# Patient Record
Sex: Male | Born: 1960 | Race: Black or African American | Hispanic: No | Smoking: Former smoker
Health system: Southern US, Community
[De-identification: ages and names within clinical notes are randomized; demographics above are authoritative.]

## PROBLEM LIST (undated history)

## (undated) DIAGNOSIS — R519 Headache, unspecified: Secondary | ICD-10-CM

## (undated) DIAGNOSIS — K635 Polyp of colon: Secondary | ICD-10-CM

## (undated) DIAGNOSIS — R51 Headache: Secondary | ICD-10-CM

## (undated) DIAGNOSIS — R972 Elevated prostate specific antigen [PSA]: Secondary | ICD-10-CM

## (undated) DIAGNOSIS — K5289 Other specified noninfective gastroenteritis and colitis: Secondary | ICD-10-CM

## (undated) DIAGNOSIS — K5790 Diverticulosis of intestine, part unspecified, without perforation or abscess without bleeding: Secondary | ICD-10-CM

## (undated) DIAGNOSIS — K449 Diaphragmatic hernia without obstruction or gangrene: Secondary | ICD-10-CM

## (undated) DIAGNOSIS — K219 Gastro-esophageal reflux disease without esophagitis: Secondary | ICD-10-CM

## (undated) HISTORY — PX: TONSILLECTOMY: SUR1361

## (undated) HISTORY — PX: SKIN BIOPSY: SHX1

## (undated) HISTORY — PX: COLON SURGERY: SHX602

## (undated) HISTORY — DX: Diverticulosis of intestine, part unspecified, without perforation or abscess without bleeding: K57.90

## (undated) HISTORY — DX: Diaphragmatic hernia without obstruction or gangrene: K44.9

## (undated) HISTORY — PX: OTHER SURGICAL HISTORY: SHX169

## (undated) HISTORY — DX: Gastro-esophageal reflux disease without esophagitis: K21.9

## (undated) HISTORY — PX: PROSTATE BIOPSY: SHX241

---

## 2002-03-12 ENCOUNTER — Encounter (INDEPENDENT_AMBULATORY_CARE_PROVIDER_SITE_OTHER): Payer: Self-pay | Admitting: *Deleted

## 2002-03-12 ENCOUNTER — Ambulatory Visit (HOSPITAL_COMMUNITY): Admission: RE | Admit: 2002-03-12 | Discharge: 2002-03-12 | Payer: Self-pay | Admitting: Gastroenterology

## 2002-03-14 ENCOUNTER — Encounter: Payer: Self-pay | Admitting: Gastroenterology

## 2002-03-14 ENCOUNTER — Ambulatory Visit (HOSPITAL_COMMUNITY): Admission: RE | Admit: 2002-03-14 | Discharge: 2002-03-14 | Payer: Self-pay | Admitting: Gastroenterology

## 2004-06-08 ENCOUNTER — Emergency Department (HOSPITAL_COMMUNITY): Admission: EM | Admit: 2004-06-08 | Discharge: 2004-06-09 | Payer: Self-pay | Admitting: Emergency Medicine

## 2005-08-19 ENCOUNTER — Ambulatory Visit: Payer: Self-pay | Admitting: Gastroenterology

## 2005-12-10 ENCOUNTER — Inpatient Hospital Stay (HOSPITAL_COMMUNITY): Admission: AC | Admit: 2005-12-10 | Discharge: 2005-12-28 | Payer: Self-pay

## 2005-12-28 ENCOUNTER — Inpatient Hospital Stay (HOSPITAL_COMMUNITY)
Admission: RE | Admit: 2005-12-28 | Discharge: 2006-01-01 | Payer: Self-pay | Admitting: Physical Medicine & Rehabilitation

## 2005-12-28 ENCOUNTER — Ambulatory Visit: Payer: Self-pay | Admitting: Physical Medicine & Rehabilitation

## 2006-01-02 ENCOUNTER — Emergency Department (HOSPITAL_COMMUNITY): Admission: EM | Admit: 2006-01-02 | Discharge: 2006-01-02 | Payer: Self-pay | Admitting: Emergency Medicine

## 2006-01-05 ENCOUNTER — Encounter
Admission: RE | Admit: 2006-01-05 | Discharge: 2006-01-12 | Payer: Self-pay | Admitting: Physical Medicine & Rehabilitation

## 2006-01-24 ENCOUNTER — Encounter: Admission: RE | Admit: 2006-01-24 | Discharge: 2006-01-24 | Payer: Self-pay | Admitting: Anesthesiology

## 2006-07-04 ENCOUNTER — Emergency Department (HOSPITAL_COMMUNITY): Admission: EM | Admit: 2006-07-04 | Discharge: 2006-07-04 | Payer: Self-pay | Admitting: Emergency Medicine

## 2006-08-03 ENCOUNTER — Ambulatory Visit: Payer: Self-pay | Admitting: Gastroenterology

## 2006-08-12 ENCOUNTER — Ambulatory Visit: Payer: Self-pay | Admitting: Gastroenterology

## 2006-08-12 ENCOUNTER — Encounter: Payer: Self-pay | Admitting: Gastroenterology

## 2006-09-07 ENCOUNTER — Ambulatory Visit: Payer: Self-pay | Admitting: Gastroenterology

## 2006-12-29 ENCOUNTER — Encounter: Admission: RE | Admit: 2006-12-29 | Discharge: 2006-12-29 | Payer: Self-pay | Admitting: Specialist

## 2007-02-03 ENCOUNTER — Encounter: Admission: RE | Admit: 2007-02-03 | Discharge: 2007-04-05 | Payer: Self-pay | Admitting: Specialist

## 2007-03-21 ENCOUNTER — Emergency Department (HOSPITAL_COMMUNITY): Admission: EM | Admit: 2007-03-21 | Discharge: 2007-03-21 | Payer: Self-pay | Admitting: Emergency Medicine

## 2007-04-10 ENCOUNTER — Encounter: Admission: RE | Admit: 2007-04-10 | Discharge: 2007-05-15 | Payer: Self-pay | Admitting: Specialist

## 2007-08-08 ENCOUNTER — Emergency Department (HOSPITAL_COMMUNITY): Admission: EM | Admit: 2007-08-08 | Discharge: 2007-08-08 | Payer: Self-pay | Admitting: Emergency Medicine

## 2007-08-26 DIAGNOSIS — K5289 Other specified noninfective gastroenteritis and colitis: Secondary | ICD-10-CM | POA: Insufficient documentation

## 2007-08-26 DIAGNOSIS — K219 Gastro-esophageal reflux disease without esophagitis: Secondary | ICD-10-CM | POA: Insufficient documentation

## 2007-08-26 DIAGNOSIS — K635 Polyp of colon: Secondary | ICD-10-CM

## 2007-08-26 DIAGNOSIS — K449 Diaphragmatic hernia without obstruction or gangrene: Secondary | ICD-10-CM | POA: Insufficient documentation

## 2007-08-26 DIAGNOSIS — K5730 Diverticulosis of large intestine without perforation or abscess without bleeding: Secondary | ICD-10-CM | POA: Insufficient documentation

## 2007-08-26 HISTORY — DX: Other specified noninfective gastroenteritis and colitis: K52.89

## 2007-08-26 HISTORY — DX: Polyp of colon: K63.5

## 2009-12-13 ENCOUNTER — Emergency Department (HOSPITAL_COMMUNITY): Admission: EM | Admit: 2009-12-13 | Discharge: 2009-12-13 | Payer: Self-pay | Admitting: Family Medicine

## 2010-07-24 ENCOUNTER — Inpatient Hospital Stay (INDEPENDENT_AMBULATORY_CARE_PROVIDER_SITE_OTHER)
Admission: RE | Admit: 2010-07-24 | Discharge: 2010-07-24 | Disposition: A | Discharge status: Home / Self Care | Payer: 59 | Source: Ambulatory Visit | Attending: Family Medicine | Admitting: Family Medicine

## 2010-07-24 DIAGNOSIS — T148 Other injury of unspecified body region: Secondary | ICD-10-CM

## 2010-07-24 DIAGNOSIS — W57XXXA Bitten or stung by nonvenomous insect and other nonvenomous arthropods, initial encounter: Secondary | ICD-10-CM

## 2010-08-18 NOTE — Assessment & Plan Note (Signed)
[facility]                         GASTROENTEROLOGY OFFICE NOTE   Randy Park, Randy Park                 MRN:          [id number]  DATE:08/03/2006                            DOB:          08-20-1960    Randy Park states he has ongoing problems with diffuse abdominal pain,  as well as five to six loose bowel movements a day for the past few  months. His previous symptoms of abdominal pain were not relieved with a  trial of Zegerid and Aciphex last spring and summer.  He has not  returned for followup since that time.  He did note small amounts of  bright red blood per rectum and worsening problems with abdominal pain,  and he presented to [facility] Emergency Room on July 04, 2006.  The records are on the chart and were reviewed.  CBC, urinalysis,  comprehensive metabolic panel, amylase, and lipase were all  unremarkable.  A KUB showed no abnormalities.  He notes no weight loss,  fevers, chills, nausea, or vomiting.  He denies any antibiotic use  within the past several months, and he is on no medications.   CURRENT MEDICATIONS:  None.   MEDICATION ALLERGIES:  AMOXICILLIN.   PHYSICAL EXAMINATION:  GENERAL:  In no acute distress.  VITAL SIGNS:  Weight 166 pounds.  Blood pressure is 112/72.  Pulse 104  and regular.  CHEST:  Clear to auscultation bilaterally.  CARDIAC:  Regular rate and rhythm without murmurs.  ABDOMEN:  Soft, nontender, nondistended.  Normoactive bowel sounds.  No  palpable organomegaly, masses, or hernias.  RECTAL:  Exam deferred to time of colonoscopy.  NEUROLOGIC:  Alert and oriented x3.  Grossly nonfocal.   ASSESSMENT/PLAN:  Worsening diarrhea with associated abdominal pain and  small volume hematochezia.  Rule out inflammatory bowel disease,  irritable bowel syndrome with hemorrhoids or another benign source of  rectal bleeding and other disorders.  Trial of Robinul Forte one b.i.d.  He has already tried a  lactose avoidance diet for two weeks with no help  in symptoms.  Schedule colonoscopy.  The risks, benefits, and  alternatives to colonoscopy with possible biopsy and possible  polypectomy discussed with the patient and he consents to proceed.  This  will be scheduled electively.     Venita Lick. Russella Dar, MD, [facility]  Electronically Signed    MTS/MedQ  DD: 08/03/2006  DT: 08/03/2006  Job #: [id number]

## 2010-08-18 NOTE — Assessment & Plan Note (Signed)
[facility]                         GASTROENTEROLOGY OFFICE NOTE   Randy Park, Randy Park                   MRN:          [id number]  DATE:09/07/2006                            DOB:          June 13, 1960    Mr. Randy Park returns for followup today.  He states that about 3 or 4  days following his colonoscopy his diarrhea and abdominal pain  completely resolved and he has no longer had any gastrointestinal  symptoms.  His reflux symptoms are under excellent control on Prilosec.  Left colon, right colon, and terminal ileal biopsies were all negative.   CURRENT MEDICATIONS:  Listed on the chart, updated and reviewed.   MEDICATION ALLERGIES:  AMOXICILLIN.   EXAMINATION:  No acute distress, weight 170.6 pounds, blood pressure is  106/70, pulse 72 and regular.  He is not re-examined.   ASSESSMENT/PLAN:  1. Gastroesophageal reflux disease.  Maintain standard anti-reflux      measures and Prilosec 20 mg p.o. q.a.m. as needed.  2. Diarrhea and abdominal pain - his symptoms have completely      resolved.  He is advised to discontinue Asacol.  3. Diverticulosis.  Long-term high fiber diet with adequate fluid      intake.  4. Return office visit with me in one year and p.r.n.     Venita Lick. Russella Dar, MD, [facility]  Electronically Signed    MTS/MedQ  DD: 09/07/2006  DT: 09/07/2006  Job #: [id number]

## 2011-01-08 LAB — RAPID STREP SCREEN (MED CTR MEBANE ONLY): Streptococcus, Group A Screen (Direct): NEGATIVE

## 2011-01-08 LAB — INFLUENZA A+B VIRUS AG-DIRECT(RAPID): Influenza B Ag: NEGATIVE

## 2011-03-01 ENCOUNTER — Emergency Department (HOSPITAL_COMMUNITY)
Admission: EM | Admit: 2011-03-01 | Discharge: 2011-03-02 | Disposition: A | Discharge status: Home / Self Care | Payer: BC Managed Care – HMO | Attending: Emergency Medicine | Admitting: Emergency Medicine

## 2011-03-01 ENCOUNTER — Encounter: Payer: Self-pay | Admitting: Emergency Medicine

## 2011-03-01 DIAGNOSIS — B9789 Other viral agents as the cause of diseases classified elsewhere: Secondary | ICD-10-CM | POA: Insufficient documentation

## 2011-03-01 DIAGNOSIS — J069 Acute upper respiratory infection, unspecified: Secondary | ICD-10-CM

## 2011-03-01 DIAGNOSIS — R05 Cough: Secondary | ICD-10-CM | POA: Insufficient documentation

## 2011-03-01 DIAGNOSIS — R059 Cough, unspecified: Secondary | ICD-10-CM | POA: Insufficient documentation

## 2011-03-01 DIAGNOSIS — B349 Viral infection, unspecified: Secondary | ICD-10-CM

## 2011-03-01 DIAGNOSIS — J3489 Other specified disorders of nose and nasal sinuses: Secondary | ICD-10-CM | POA: Insufficient documentation

## 2011-03-01 NOTE — ED Notes (Signed)
PT. REPORTS PERSISTENT DRY COUGH WITH RUNNY NOSE ONSET LAST Saturday .

## 2011-03-02 ENCOUNTER — Emergency Department (HOSPITAL_COMMUNITY): Payer: BC Managed Care – HMO

## 2011-03-02 MED ORDER — HYDROCOD POLST-CHLORPHEN POLST 10-8 MG/5ML PO LQCR: 5.0000 mL | Freq: Two times a day (BID) | ORAL | Status: DC | PRN

## 2011-03-02 MED ORDER — ALBUTEROL SULFATE (5 MG/ML) 0.5% IN NEBU
5.0000 mg | INHALATION_SOLUTION | Freq: Once | RESPIRATORY_TRACT | Status: AC
  Administered 2011-03-02: 5 mg via RESPIRATORY_TRACT
  Filled 2011-03-02: qty 1

## 2011-03-02 MED ORDER — IPRATROPIUM BROMIDE 0.02 % IN SOLN
0.5000 mg | Freq: Once | RESPIRATORY_TRACT | Status: AC
  Administered 2011-03-02: 0.5 mg via RESPIRATORY_TRACT
  Filled 2011-03-02: qty 2.5

## 2011-03-02 MED ORDER — HYDROCOD POLST-CHLORPHEN POLST 10-8 MG/5ML PO LQCR
5.0000 mL | Freq: Once | ORAL | Status: AC
  Administered 2011-03-02: 5 mL via ORAL
  Filled 2011-03-02: qty 5

## 2011-03-02 NOTE — ED Notes (Signed)
ASSUMED CARE ON PT. NO PAIN OR DISCOMFORT AT THIS TIME , RESPIRATIONS UNLABORED .

## 2011-03-02 NOTE — ED Notes (Signed)
The pt is c/o the wait. He has had a chest xray.  He has been coughing for 2 days no temp

## 2011-03-02 NOTE — ED Provider Notes (Signed)
History     CSN: [id number] Arrival date & time: 03/01/2011 11:13 PM   First MD Initiated Contact with Patient 03/02/11 0444      Chief Complaint  Patient presents with  . Cough     Patient is a 50 y.o. male presenting with cough. The history is provided by the patient.  Cough This is a new problem. The current episode started 2 days ago. The problem occurs every few minutes. The problem has not changed since onset.The cough is non-productive. There has been no fever. Associated symptoms include rhinorrhea. Pertinent negatives include no chills, no ear pain, no sore throat, no shortness of breath, no wheezing and no eye redness. He has tried nothing for the symptoms.  Reports persistent dry cough for approximately 2 days. States his grandson has been sick with similar symptoms. He is concerned he might have pneumonia.  History reviewed. No pertinent past medical history.  History reviewed. No pertinent past surgical history.  No family history on file.  History  Substance Use Topics  . Smoking status: Current Everyday Smoker  . Smokeless tobacco: Not on file  . Alcohol Use: Yes      Review of Systems  Constitutional: Negative.  Negative for chills.  HENT: Positive for rhinorrhea. Negative for ear pain and sore throat.   Eyes: Negative.  Negative for redness.  Respiratory: Positive for cough. Negative for shortness of breath and wheezing.   Cardiovascular: Negative.   Gastrointestinal: Negative.   Genitourinary: Negative.   Musculoskeletal: Negative.   Skin: Negative.   Neurological: Negative.   Hematological: Negative.   Psychiatric/Behavioral: Negative.     Allergies  Amoxicillin  Home Medications   Current Outpatient Rx  Name Route Sig Dispense Refill  . MUCINEX PO Oral Take 1 tablet by mouth daily as needed. For congestion     . OVER THE COUNTER MEDICATION Oral Take 2 tablets by mouth daily as needed. For congestion "otc pseudoephedrine multi-symptom" long  green capsule     . ALKA-SELTZER PLS NIGHT CLD/FLU PO Oral Take 4 tablets by mouth daily as needed. For cold/flu like symptoms     . NYQUIL PO Oral Take 30 mLs by mouth daily as needed. For cough       BP 137/84  Pulse 59  Temp(Src) 97.2 F (36.2 C) (Oral)  Resp 20  SpO2 98%  Physical Exam  Constitutional: He is oriented to person, place, and time. He appears well-developed and well-nourished.  HENT:  Head: Normocephalic and atraumatic.  Eyes: Conjunctivae are normal.  Neck: Neck supple.  Cardiovascular: Normal rate and regular rhythm.   Pulmonary/Chest: Effort normal and breath sounds normal.       Frequent dry cough noted  Abdominal: Soft. Bowel sounds are normal.  Neurological: He is alert and oriented to person, place, and time.  Skin: Skin is warm and dry.  Psychiatric: He has a normal mood and affect.    ED Course  Procedures Findings and impression discussed w/ pt. Will d/c home w/ medication for cough and encourage f/u w/ PCP.  Labs Reviewed - No data to display Dg Chest 2 View  03/02/2011  *RADIOLOGY REPORT*  Clinical Data: Dry cough and chest pain for 2 days.  CHEST - 2 VIEW  Comparison: None.  Findings: The lungs are well-aerated and clear.  There is no evidence of focal opacification, pleural effusion or pneumothorax.  The heart is normal in size; the mediastinal contour is within normal limits.  No acute osseous abnormalities are  seen.  IMPRESSION: No acute cardiopulmonary process seen.  Original Report Authenticated By: Tonia Ghent, M.D.     No diagnosis found.    MDM  HPI, CXR and PE c/w URI        Leanne Chang, NP 03/02/11 [phone]

## 2011-03-02 NOTE — ED Provider Notes (Signed)
Medical screening examination/treatment/procedure(s) were performed by non-physician practitioner and as supervising physician I was immediately available for consultation/collaboration.  Mitsuko Luera, MD 03/02/11 0833 

## 2012-04-09 ENCOUNTER — Emergency Department (INDEPENDENT_AMBULATORY_CARE_PROVIDER_SITE_OTHER)
Admission: EM | Admit: 2012-04-09 | Discharge: 2012-04-09 | Disposition: A | Discharge status: Home / Self Care | Payer: BC Managed Care – HMO | Source: Home / Self Care | Attending: Family Medicine | Admitting: Family Medicine

## 2012-04-09 ENCOUNTER — Encounter (HOSPITAL_COMMUNITY): Payer: Self-pay | Admitting: *Deleted

## 2012-04-09 DIAGNOSIS — B354 Tinea corporis: Secondary | ICD-10-CM

## 2012-04-09 MED ORDER — KETOCONAZOLE 2 % EX CREA: TOPICAL_CREAM | Freq: Every day | CUTANEOUS | Status: DC

## 2012-04-09 MED ORDER — TERBINAFINE HCL 250 MG PO TABS: 250.0000 mg | ORAL_TABLET | Freq: Every day | ORAL | Status: DC

## 2012-04-09 NOTE — ED Notes (Signed)
Patient states he has ring worm on his right shoulder. He states that the rash started one month ago and was only 1 inch in diameter. He started using lotrimin OTC and it got bigger and wont go away. Denies fever/chills, diarrhea, nausea, and vomiting.

## 2012-04-09 NOTE — ED Notes (Signed)
Waiting on discharge papers 

## 2012-04-09 NOTE — ED Provider Notes (Signed)
History     CSN: [id number]  Arrival date & time 04/09/12  1121   First MD Initiated Contact with Patient 04/09/12 1148      Chief Complaint  Patient presents with  . Rash    (Consider location/radiation/quality/duration/timing/severity/associated sxs/prior treatment) HPI Comments: 52 year old smoker male, otherwise with no significant past medical history. Here complaining of a round of rash in his right shoulder for 1 month. Initially was pruriginous and had peeling in the center as per patients report; patient has used over-the-counter medication (Lotrimin) with no significant improvement. Patient impression is that the patch appears getting bigger. Patient is otherwise asymptomatic.   History reviewed. No pertinent past medical history.  History reviewed. No pertinent past surgical history.  No family history on file.  History  Substance Use Topics  . Smoking status: Current Every Day Smoker  . Smokeless tobacco: Not on file  . Alcohol Use: Yes      Review of Systems  Constitutional: Negative for fever and chills.  Gastrointestinal: Negative for abdominal pain.  Musculoskeletal: Negative for myalgias, joint swelling and arthralgias.  Skin: Positive for rash.  Neurological: Negative for headaches.    Allergies  Amoxicillin  Home Medications   Current Outpatient Rx  Name  Route  Sig  Dispense  Refill  . KETOCONAZOLE 2 % EX CREA   Topical   Apply topically daily.   15 g   0   . TERBINAFINE HCL 250 MG PO TABS   Oral   Take 1 tablet (250 mg total) by mouth daily.   14 tablet   0     BP 125/85  Pulse 73  Temp 98.1 F (36.7 C) (Oral)  Resp 16  SpO2 100%  Physical Exam  Nursing note and vitals reviewed. Constitutional: He is oriented to person, place, and time. He appears well-developed and well-nourished. No distress.  HENT:  Head: Normocephalic and atraumatic.  Mouth/Throat: Oropharynx is clear and moist.  Eyes: Conjunctivae normal are normal.    Cardiovascular: Normal heart sounds.   Pulmonary/Chest: Breath sounds normal.  Abdominal: Soft. There is no tenderness.  Lymphadenopathy:    He has no cervical adenopathy.  Neurological: He is alert and oriented to person, place, and time.  Skin:       There is a round patch with raise circinate border and normal skin in center located anterior to right shoulder above axilla. No vesicles, no peeling, no pustules. No induration. No tender, no pruriginous, no increased temp. Rest of body skin is clear.    ED Course  Procedures (including critical care time)  Labs Reviewed - No data to display No results found.   1. Tinea corporis       MDM  Treatment discussed with patient he will try ketoconazole cream and if not improvement he will fill a prescription for terbinafine oral daily for 2 weeks; prescriptions were provided to patient today. Supportive care and red flags that should prompt his return to medical attention discussed with patient and provided in writing. Dermatology referral as needed.        Sharin Grave, MD 04/10/12 [phone]

## 2012-04-17 ENCOUNTER — Emergency Department (HOSPITAL_COMMUNITY)
Admission: EM | Admit: 2012-04-17 | Discharge: 2012-04-17 | Disposition: A | Discharge status: Home / Self Care | Payer: Medicare Other | Attending: Emergency Medicine | Admitting: Emergency Medicine

## 2012-04-17 ENCOUNTER — Emergency Department (HOSPITAL_COMMUNITY): Payer: Medicare Other

## 2012-04-17 ENCOUNTER — Encounter (HOSPITAL_COMMUNITY): Payer: Self-pay | Admitting: *Deleted

## 2012-04-17 DIAGNOSIS — R1032 Left lower quadrant pain: Secondary | ICD-10-CM | POA: Insufficient documentation

## 2012-04-17 DIAGNOSIS — F17200 Nicotine dependence, unspecified, uncomplicated: Secondary | ICD-10-CM | POA: Insufficient documentation

## 2012-04-17 DIAGNOSIS — R091 Pleurisy: Secondary | ICD-10-CM

## 2012-04-17 LAB — CBC
HCT: 43.3 % (ref 39.0–52.0)
Hemoglobin: 13.9 g/dL (ref 13.0–17.0)
MCV: 93.3 fL (ref 78.0–100.0)
RBC: 4.64 MIL/uL (ref 4.22–5.81)
RDW: 12.8 % (ref 11.5–15.5)
WBC: 5.5 10*3/uL (ref 4.0–10.5)

## 2012-04-17 LAB — POCT I-STAT TROPONIN I: Troponin i, poc: 0 ng/mL (ref 0.00–0.08)

## 2012-04-17 MED ORDER — HYDROCODONE-ACETAMINOPHEN 5-325 MG PO TABS: 1.0000 | ORAL_TABLET | Freq: Four times a day (QID) | ORAL | Status: DC | PRN

## 2012-04-17 MED ORDER — IOHEXOL 350 MG/ML SOLN
100.0000 mL | Freq: Once | INTRAVENOUS | Status: AC | PRN
  Administered 2012-04-17: 100 mL via INTRAVENOUS

## 2012-04-17 NOTE — ED Notes (Signed)
PT is here with LLQ abdominal pain since Friday.  Pt reports developed left sided chest pain last nite

## 2012-04-17 NOTE — ED Provider Notes (Signed)
History     CSN: [id number]  Arrival date & time 04/17/12  0846   First MD Initiated Contact with Patient 04/17/12 1218      Chief Complaint  Patient presents with  . Chest Pain  . Abdominal Pain    (Consider location/radiation/quality/duration/timing/severity/associated sxs/prior treatment) HPI Comments: Patient denies any true abdominal pain and states it's all in his left side under his ribs  Patient is a 52 y.o. male presenting with chest pain. The history is provided by the patient.  Chest Pain The chest pain began 3 - 5 days ago. Chest pain occurs constantly. The chest pain is worsening. The pain is associated with breathing. At its most intense, the pain is at 6/10. The pain is currently at 6/10. The severity of the pain is moderate. The quality of the pain is described as pleuritic and sharp. The pain does not radiate. Chest pain is worsened by deep breathing. Pertinent negatives for primary symptoms include no fever, no shortness of breath, no cough, no wheezing, no palpitations, no abdominal pain, no nausea and no vomiting.  Pertinent negatives for associated symptoms include no lower extremity edema. He tried nothing for the symptoms. 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  Pertinent negatives for past medical history include no CAD, no hyperlipidemia, no hypertension and no MI.     History reviewed. No pertinent past medical history.  Past Surgical History  Procedure Date  . Head surgery     No family history on file.  History  Substance Use Topics  . Smoking status: Current Every Day Smoker  . Smokeless tobacco: Not on file  . Alcohol Use: Yes      Review of Systems  Constitutional: Negative for fever.  Respiratory: Negative for cough, shortness of breath and wheezing.   Cardiovascular: Positive for chest pain. Negative for palpitations and leg swelling.  Gastrointestinal: Negative for nausea,  vomiting, abdominal pain and diarrhea.  All other systems reviewed and are negative.    Allergies  Amoxicillin  Home Medications   Current Outpatient Rx  Name  Route  Sig  Dispense  Refill  . TERBINAFINE HCL 250 MG PO TABS   Oral   Take 250 mg by mouth daily. Starting on 04/12/12  For 14 days           BP 121/84  Pulse 70  Temp 97.6 F (36.4 C) (Oral)  Resp 18  SpO2 100%  Physical Exam  Nursing note and vitals reviewed. Constitutional: He is oriented to person, place, and time. He appears well-developed and well-nourished. No distress.  HENT:  Head: Normocephalic and atraumatic.  Mouth/Throat: Oropharynx is clear and moist.  Eyes: Conjunctivae normal and EOM are normal. Pupils are equal, round, and reactive to light.  Neck: Normal range of motion. Neck supple.  Cardiovascular: Normal rate, regular rhythm and intact distal pulses.   No murmur heard. Pulmonary/Chest: Effort normal and breath sounds normal. No respiratory distress. He has no wheezes. He has no rales.   He exhibits tenderness. He exhibits no bony tenderness, no crepitus, no deformity and no swelling.    Abdominal: Soft. He exhibits no distension. There is no tenderness. There is no rebound and no guarding.  Musculoskeletal: Normal range of motion. He exhibits no edema and no tenderness.  Neurological: He is alert and oriented to person, place, and time.  Skin: Skin is warm and dry. No rash noted. No erythema.  Psychiatric: He has a normal mood  and affect. His behavior is normal.    ED Course  Procedures (including critical care time)   Labs Reviewed  CBC  LIPASE, BLOOD  POCT I-STAT TROPONIN I  URINALYSIS, ROUTINE W REFLEX MICROSCOPIC   Dg Chest 2 View  04/17/2012  *RADIOLOGY REPORT*  Clinical Data: Left lower chest pain, abdominal pain  CHEST - 2 VIEW  Comparison: 03/01/2011  Findings: Normal heart size, mediastinal contours, and pulmonary vascularity. Minimal chronic peribronchial thickening. No  definite infiltrate, pleural effusion or pneumothorax. Questionable vague left upper lobe nodular density. Density at the anterior left first rib appears little changed since 2012. No new osseous abnormalities identified.  IMPRESSION: Minimal chronic bronchitic changes. No acute infiltrate. Question vague left upper lobe nodular density; CT chest without contrast recommended to exclude pulmonary nodule.   Original Report Authenticated By: Ulyses Southward, M.D.    Ct Angio Chest Pe W/cm &/or Wo Cm  04/17/2012  *RADIOLOGY REPORT*  Clinical Data: Left lower quadrant abdominal pain, left-sided chest pain, possible nodular opacity overlying left upper lung; evaluate for pulmonary embolism  CT ANGIOGRAPHY CHEST  Technique:  Multidetector CT imaging of the chest using the standard protocol during bolus administration of intravenous contrast. Multiplanar reconstructed images including MIPs were obtained and reviewed to evaluate the vascular anatomy.  Contrast: OMNIPAQUE IOHEXOL 350 MG/ML SOLN  Comparison: Chest radiograph - earlier same day; chest CT - 12/09/2005  Vascular Findings:  There is adequate opacification of the pulmonary arterial system with the main pulmonary artery measuring 333 HU.  No discrete filling defects are seen within the pulmonary arterial tree to suggest pulmonary embolism.  Normal caliber of the main pulmonary artery.  Normal heart size.  Minimal coronary artery calcifications.  No pericardial effusion.  Normal caliber of the thoracic aorta.  Bovine configuration of the aortic arch is incidentally noted.  No periaortic stranding.  ---------------------------------------------------------  Nonvascular findings:  Mild to moderate centrilobular emphysematous change involving the bilateral lung apices.  No pneumothorax.  Minimal dependent ground- glass atelectasis.  No focal airspace opacities.  No discrete pulmonary nodules.  No pleural effusion.  No mediastinal, hilar or axillary lymphadenopathy.   Incidental early arterial phase imaging of the upper abdomen is normal.  Normal appearance of the thyroid gland.  No acute or aggressive osseous abnormalities. Likely post-traumatic deformity involving the anterior aspect of the left first rib with associated pseudoarticulation with the undersurface of the medial aspect of the left clavicle.  IMPRESSION: 1.  No acute cardiopulmonary disease.  Specifically, negative for pulmonary embolism. 2.  There is no CT correlate with possible vague nodular opacity overlying the superior aspect of the left upper lobe.  3.  Moderate biapical centrilobular emphysematous change. 4. Like post-traumatic deformity of the anterior aspect of the left first rib with associated pseudoarticulation with the undersurface of the medial aspect of the left clavicle.   Original Report Authenticated By: Tacey Ruiz, MD     Date: 04/17/2012  Rate: 69  Rhythm: normal sinus rhythm  QRS Axis: normal  Intervals: normal  ST/T Wave abnormalities: nonspecific ST/T changes  Conduction Disutrbances:first-degree A-V block  and Early repolarization  Narrative Interpretation:   Old EKG Reviewed: none available    No diagnosis found.    MDM   Patient with left-sided rib pain and left chest pain that started approximately 4 days ago. Pleuritic in nature denies any shortness of breath, diaphoresis, nausea or vomiting. He denies any abdominal pain or urinary symptoms. 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 Chest x-ray shows a possible nodule that is also recommending a CT 4. We'll do a CTA to rule out PE. EKG, troponin, CBC and CMP are all within normal limits. Patient's vital signs are normal and he is in no acute distress. He denies any trauma or musculoskeletal injury that would be causing his pain   CT is negative for PE but does show emphysematous changes. Feel patient's symptoms are most consistent with pleurisy. Patient was discharged home to     Gwyneth Sprout,  MD 04/17/12 [phone]

## 2013-08-28 ENCOUNTER — Encounter: Payer: Self-pay | Admitting: Gastroenterology

## 2013-09-03 ENCOUNTER — Encounter: Payer: Self-pay | Admitting: Gastroenterology

## 2013-09-03 NOTE — Telephone Encounter (Signed)
Error

## 2013-09-06 ENCOUNTER — Emergency Department (HOSPITAL_COMMUNITY)
Admission: EM | Admit: 2013-09-06 | Discharge: 2013-09-06 | Disposition: A | Discharge status: Home / Self Care | Payer: BC Managed Care – PPO | Attending: Emergency Medicine | Admitting: Emergency Medicine

## 2013-09-06 ENCOUNTER — Emergency Department (HOSPITAL_COMMUNITY): Payer: BC Managed Care – PPO

## 2013-09-06 ENCOUNTER — Encounter (HOSPITAL_COMMUNITY): Payer: Self-pay | Admitting: Emergency Medicine

## 2013-09-06 DIAGNOSIS — K5730 Diverticulosis of large intestine without perforation or abscess without bleeding: Secondary | ICD-10-CM | POA: Insufficient documentation

## 2013-09-06 DIAGNOSIS — Z880 Allergy status to penicillin: Secondary | ICD-10-CM | POA: Insufficient documentation

## 2013-09-06 DIAGNOSIS — K5792 Diverticulitis of intestine, part unspecified, without perforation or abscess without bleeding: Secondary | ICD-10-CM

## 2013-09-06 DIAGNOSIS — F17200 Nicotine dependence, unspecified, uncomplicated: Secondary | ICD-10-CM | POA: Insufficient documentation

## 2013-09-06 DIAGNOSIS — Z79899 Other long term (current) drug therapy: Secondary | ICD-10-CM | POA: Insufficient documentation

## 2013-09-06 LAB — CBC WITH DIFFERENTIAL/PLATELET
Basophils Absolute: 0 10*3/uL (ref 0.0–0.1)
Basophils Relative: 0 % (ref 0–1)
EOS PCT: 2 % (ref 0–5)
Eosinophils Absolute: 0.2 10*3/uL (ref 0.0–0.7)
HCT: 41.7 % (ref 39.0–52.0)
HEMOGLOBIN: 14.4 g/dL (ref 13.0–17.0)
LYMPHS ABS: 2.7 10*3/uL (ref 0.7–4.0)
LYMPHS PCT: 34 % (ref 12–46)
MCH: 32.4 pg (ref 26.0–34.0)
MCHC: 34.5 g/dL (ref 30.0–36.0)
MCV: 93.7 fL (ref 78.0–100.0)
Monocytes Absolute: 1.3 10*3/uL — ABNORMAL HIGH (ref 0.1–1.0)
Monocytes Relative: 16 % — ABNORMAL HIGH (ref 3–12)
Neutro Abs: 4 10*3/uL (ref 1.7–7.7)
Neutrophils Relative %: 48 % (ref 43–77)
PLATELETS: 343 10*3/uL (ref 150–400)
RBC: 4.45 MIL/uL (ref 4.22–5.81)
RDW: 12.8 % (ref 11.5–15.5)
WBC: 8.2 10*3/uL (ref 4.0–10.5)

## 2013-09-06 LAB — BASIC METABOLIC PANEL
BUN: 17 mg/dL (ref 6–23)
CALCIUM: 9.7 mg/dL (ref 8.4–10.5)
CO2: 27 meq/L (ref 19–32)
Chloride: 102 mEq/L (ref 96–112)
Creatinine, Ser: 1.28 mg/dL (ref 0.50–1.35)
GFR calc Af Amer: 74 mL/min — ABNORMAL LOW (ref >90)
GFR calc non Af Amer: 64 mL/min — ABNORMAL LOW (ref >90)
GLUCOSE: 94 mg/dL (ref 70–99)
POTASSIUM: 4.4 meq/L (ref 3.7–5.3)
SODIUM: 141 meq/L (ref 137–147)

## 2013-09-06 LAB — URINALYSIS, ROUTINE W REFLEX MICROSCOPIC
Bilirubin Urine: NEGATIVE
GLUCOSE, UA: NEGATIVE mg/dL
Ketones, ur: NEGATIVE mg/dL
Leukocytes, UA: NEGATIVE
Nitrite: NEGATIVE
Protein, ur: NEGATIVE mg/dL
SPECIFIC GRAVITY, URINE: 1.02 (ref 1.005–1.030)
Urobilinogen, UA: 1 mg/dL (ref 0.0–1.0)
pH: 5.5 (ref 5.0–8.0)

## 2013-09-06 LAB — URINE MICROSCOPIC-ADD ON

## 2013-09-06 LAB — POC OCCULT BLOOD, ED: FECAL OCCULT BLD: NEGATIVE

## 2013-09-06 MED ORDER — OXYCODONE-ACETAMINOPHEN 5-325 MG PO TABS
1.0000 | ORAL_TABLET | Freq: Four times a day (QID) | ORAL | Status: DC | PRN
Start: 2013-09-06 — End: 2013-09-29

## 2013-09-06 MED ORDER — ONDANSETRON HCL 4 MG/2ML IJ SOLN
4.0000 mg | Freq: Once | INTRAMUSCULAR | Status: AC
  Administered 2013-09-06: 4 mg via INTRAVENOUS
  Filled 2013-09-06: qty 2

## 2013-09-06 MED ORDER — MORPHINE SULFATE 4 MG/ML IJ SOLN
4.0000 mg | Freq: Once | INTRAMUSCULAR | Status: AC
  Administered 2013-09-06: 4 mg via INTRAVENOUS
  Filled 2013-09-06: qty 1

## 2013-09-06 MED ORDER — SODIUM CHLORIDE 0.9 % IV BOLUS (SEPSIS)
1000.0000 mL | Freq: Once | INTRAVENOUS | Status: AC
  Administered 2013-09-06: 1000 mL via INTRAVENOUS

## 2013-09-06 MED ORDER — ONDANSETRON HCL 4 MG PO TABS: 4.0000 mg | ORAL_TABLET | Freq: Four times a day (QID) | ORAL | Status: DC

## 2013-09-06 MED ORDER — IOHEXOL 300 MG/ML  SOLN
100.0000 mL | Freq: Once | INTRAMUSCULAR | Status: AC | PRN
  Administered 2013-09-06: 100 mL via INTRAVENOUS

## 2013-09-06 MED ORDER — CIPROFLOXACIN HCL 500 MG PO TABS
500.0000 mg | ORAL_TABLET | Freq: Once | ORAL | Status: AC
  Administered 2013-09-06: 500 mg via ORAL
  Filled 2013-09-06: qty 1

## 2013-09-06 MED ORDER — METRONIDAZOLE 500 MG PO TABS: 500.0000 mg | ORAL_TABLET | Freq: Two times a day (BID) | ORAL | Status: DC

## 2013-09-06 MED ORDER — METRONIDAZOLE 500 MG PO TABS
500.0000 mg | ORAL_TABLET | Freq: Once | ORAL | Status: AC
  Administered 2013-09-06: 500 mg via ORAL
  Filled 2013-09-06: qty 1

## 2013-09-06 MED ORDER — IOHEXOL 300 MG/ML  SOLN
50.0000 mL | Freq: Once | INTRAMUSCULAR | Status: AC | PRN
  Administered 2013-09-06: 50 mL via ORAL

## 2013-09-06 MED ORDER — CIPROFLOXACIN HCL 500 MG PO TABS: 500.0000 mg | ORAL_TABLET | Freq: Two times a day (BID) | ORAL | Status: DC

## 2013-09-06 NOTE — ED Notes (Signed)
Pt states lower abdominal pain for a week.  Pt has had some diarrhea with no vomiting or nausea.  Pt also states he had light red blood in stool yesterday.

## 2013-09-06 NOTE — ED Notes (Signed)
Patient transported to CT 

## 2013-09-06 NOTE — Progress Notes (Signed)
  CARE MANAGEMENT ED NOTE 09/06/2013  Patient:  Randy Park, Randy Park   Account Number:  [id number]  Date Initiated:  09/06/2013  Documentation initiated by:  Edd Arbour  Subjective/Objective Assessment:   53 yr old [employer] [place] state [place] c/o lower abdominal pain for a week.  Pt has had some diarrhea with no vomiting or nausea     Subjective/Objective Assessment Detail:   no pcp listed in epic  pt states he does not have a pcp states he prefers to continue to go to Dr Theresia Lo at [place] Denied need for a list of [employer] providers in network  states Dr Lajuana Ripple stark is he GI provider Has an appt with him on Tuesday September 11 2013     Action/Plan:   CM spoke with updated EPIC   Action/Plan Detail:   Anticipated DC Date:  09/06/2013     Status Recommendation to Physician:   Result of Recommendation:    Other ED Services  Consult Working Plan    DC Planning Services  Other  PCP issues  Outpatient Services - Pt will follow up    Choice offered to / List presented to:            Status of service:  Completed, signed off  ED Comments:   ED Comments Detail:

## 2013-09-06 NOTE — ED Provider Notes (Signed)
CSN: [id number]     Arrival date & time 09/06/13  1239 History   First MD Initiated Contact with Patient 09/06/13 1252     Chief Complaint  Patient presents with  . Abdominal Pain     (Consider location/radiation/quality/duration/timing/severity/associated sxs/prior Treatment) HPI  Patient presents to the ED with complaints of left lower abdominal pain that has been progressing over the past week. He says that it is associated with diarrhea and it is a sharp pain intermittently but constantly a dull fullness pain. He comes to the emergency department today because he says that the is having worsening pain. He told his boss last night he was chronic him to the emergency department today to get it checked out because he was so uncomfortable. He has a past medical history of diverticulosis but never diverticulitis. He reports a small amount of blood noted on the tissue paper when he wiped last night. No frank blood. He denies nausea, vomiting, right lower or right upper quadrant pain. No urinary symptoms, injury, anal pain, weakness or shortness of breath. He describes himself as otherwise healthy.  Past Medical History  Diagnosis Date  . GERD (gastroesophageal reflux disease)   . Diverticulosis   . Hiatal hernia    Past Surgical History  Procedure Laterality Date  . Head surgery     History reviewed. No pertinent family history. History  Substance Use Topics  . Smoking status: Current Every Day Smoker  . Smokeless tobacco: Not on file  . Alcohol Use: Yes     Comment: social    Review of Systems   Review of Systems  Gen: no weight loss, fevers, chills, night sweats  Eyes: no discharge or drainage, no occular pain or visual changes  Nose: no epistaxis or rhinorrhea  Mouth: no dental pain, no sore throat  Neck: no neck pain  Lungs:No wheezing, coughing or hemoptysis CV: no chest pain, palpitations, dependent edema or orthopnea  Abd: + abdominal pain and  diarrhea  No nausea,  vomiting, GU: no dysuria or gross hematuria  MSK:  No muscle weakness or pain Neuro: no headache, no focal neurologic deficits  Skin: no rash or wounds Psyche: no complaints    Allergies  Amoxicillin  Home Medications   Prior to Admission medications   Medication Sig Start Date End Date Taking? Authorizing Provider  ibuprofen (ADVIL,MOTRIN) 200 MG tablet Take 400 mg by mouth every 6 (six) hours as needed for moderate pain.   Yes Historical Provider, MD  OVER THE COUNTER MEDICATION Apply 1 application topically daily.   Yes Historical Provider, MD  ciprofloxacin (CIPRO) 500 MG tablet Take 1 tablet (500 mg total) by mouth 2 (two) times daily. 09/06/13   Jacklynn Dehaas Irine SealG Silverio Hagan, PA-C  metroNIDAZOLE (FLAGYL) 500 MG tablet Take 1 tablet (500 mg total) by mouth 2 (two) times daily. 09/06/13   Stephan Nelis Irine SealG Zack Crager, PA-C  ondansetron (ZOFRAN) 4 MG tablet Take 1 tablet (4 mg total) by mouth every 6 (six) hours. 09/06/13   Sylvia Helms Irine SealG Montine Hight, PA-C  oxyCODONE-acetaminophen (PERCOCET/ROXICET) 5-325 MG per tablet Take 1-2 tablets by mouth every 6 (six) hours as needed for severe pain. 09/06/13   Rhemi Balbach Irine SealG Christyanna Mckeon, PA-C   BP 108/72  Pulse 75  Temp(Src) 97.8 F (36.6 C) (Oral)  Resp 16  SpO2 100% Physical Exam  Nursing note and vitals reviewed. Constitutional: He appears well-developed and well-nourished. No distress.  HENT:  Head: Normocephalic and atraumatic.  Eyes: Pupils are equal, round, and reactive to light.  Neck: Normal range of motion. Neck supple.  Cardiovascular: Normal rate and regular rhythm.   Pulmonary/Chest: Effort normal.  Abdominal: Soft. Bowel sounds are normal. He exhibits no mass. There is tenderness in the left lower quadrant. There is guarding and CVA tenderness. There is no rigidity and no rebound.  Genitourinary: Rectum normal. Guaiac negative stool.  Neurological: He is alert.  Skin: Skin is warm and dry.      ED Course  Procedures (including critical care time) Labs  Review Labs Reviewed  CBC WITH DIFFERENTIAL - Abnormal; Notable for the following:    Monocytes Relative 16 (*)    Monocytes Absolute 1.3 (*)    All other components within normal limits  BASIC METABOLIC PANEL - Abnormal; Notable for the following:    GFR calc non Af Amer 64 (*)    GFR calc Af Amer 74 (*)    All other components within normal limits  URINALYSIS, ROUTINE W REFLEX MICROSCOPIC  OCCULT BLOOD X 1 CARD TO LAB, STOOL  POC OCCULT BLOOD, ED    Imaging Review Ct Abdomen Pelvis W Contrast  09/06/2013   CLINICAL DATA:  Left lower quadrant pain  EXAM: CT ABDOMEN AND PELVIS WITH CONTRAST  TECHNIQUE: Multidetector CT imaging of the abdomen and pelvis was performed using the standard protocol following bolus administration of intravenous contrast.  CONTRAST:  50mL OMNIPAQUE IOHEXOL 300 MG/ML SOLN, OMNIPAQUE IOHEXOL 300 MG/ML SOLN  COMPARISON:  None.  FINDINGS: The lung bases are clear.  The liver demonstrates no focal abnormality. There is no intrahepatic or extrahepatic biliary ductal dilatation. The gallbladder is normal. The spleen demonstrates no focal abnormality. The kidneys, adrenal glands and pancreas are normal. The bladder is unremarkable.  The stomach, duodenum, small intestine, and large intestine demonstrate no contrast extravasation or dilatation. There is diverticulosis of the descending colon and sigmoid colon. At the junction of the descending colon and sigmoid colon there is bowel wall thickening, pericolonic inflammatory changes most concerning for acute diverticulitis. There is no peridiverticular fluid collection. There is a normal caliber appendix in the right lower quadrant without periappendiceal inflammatory changes. There is no pneumoperitoneum, pneumatosis, or portal venous gas. There is no abdominal or pelvic free fluid. There is no lymphadenopathy.  The abdominal aorta is normal in caliber .  There are no lytic or sclerotic osseous lesions. 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  IMPRESSION: Acute diverticulitis at the junction of the descending colon and sigmoid colon. No adjacent focal fluid collection to suggest an abscess. Recommend follow-up colonoscopy following the resolution of the patient's acute illness to exclude any underlying pathology.   Electronically Signed   By: Elige Ko   On: 09/06/2013 14:45     EKG Interpretation None      MDM   Final diagnoses:  Acute diverticulitis    Patient given fluids and pain medications. Cbc, bmp and urinalysis pending.CT abd/pelv pending  2:27pm His labs are reassuring. No elevated WBC. He no longer is having pain since the dose of medications and is currently drinking contrast for CT.  2:55pm Patients CT scan is positive for acute diverticulitis. He does not appear toxic and has had no vomiting and no white count. Will do well at home. Rx Cipro/flagyl, pain and nausea medication. Refer back to his PCP.GI doctor.  52 y.o.Randy Park's evaluation in the Emergency Department is complete. It has been determined that no acute conditions requiring further emergency intervention are present at this time. The patient/guardian have been  advised of the diagnosis and plan. We have discussed signs and symptoms that warrant return to the ED, such as changes or worsening in symptoms.  Vital signs are stable at discharge. Filed Vitals:   09/06/13 1301  BP: 108/72  Pulse: 75  Temp: 97.8 F (36.6 C)  Resp: 16    Patient/guardian has voiced understanding and agreed to follow-up with the PCP or specialist.   Dorthula Matas, PA-C 09/06/13 1456

## 2013-09-06 NOTE — ED Provider Notes (Signed)
Medical screening examination/treatment/procedure(s) were performed by non-physician practitioner and as supervising physician I was immediately available for consultation/collaboration.   EKG Interpretation None        Richardean Canal, MD 09/06/13 [phone]

## 2013-09-06 NOTE — Discharge Instructions (Signed)
Diverticulitis °A diverticulum is a small pouch or sac on the colon. Diverticulosis is the presence of these diverticula on the colon. Diverticulitis is the irritation (inflammation) or infection of diverticula. °CAUSES  °The colon and its diverticula contain bacteria. If food particles block the tiny opening to a diverticulum, the bacteria inside can grow and cause an increase in pressure. This leads to infection and inflammation and is called diverticulitis. °SYMPTOMS  °· Abdominal pain and tenderness. Usually, the pain is located on the left side of your abdomen. However, it could be located elsewhere. °· Fever. °· Bloating. °· Feeling sick to your stomach (nausea). °· Throwing up (vomiting). °· Abnormal stools. °DIAGNOSIS  °Your caregiver will take a history and perform a physical exam. Since many things can cause abdominal pain, other tests may be necessary. Tests may include: °· Blood tests. °· Urine tests. °· X-ray of the abdomen. °· CT scan of the abdomen. °Sometimes, surgery is needed to determine if diverticulitis or other conditions are causing your symptoms. °TREATMENT  °Most of the time, you can be treated without surgery. Treatment includes: °· Resting the bowels by only having liquids for a few days. As you improve, you will need to eat a low-fiber diet. °· Intravenous (IV) fluids if you are losing body fluids (dehydrated). °· Antibiotic medicines that treat infections may be given. °· Pain and nausea medicine, if needed. °· Surgery if the inflamed diverticulum has burst. °HOME CARE INSTRUCTIONS  °· Try a clear liquid diet (broth, tea, or water for as long as directed by your caregiver). You may then gradually begin a low-fiber diet as tolerated.  °A low-fiber diet is a diet with less than 10 grams of fiber. Choose the foods below to reduce fiber in the diet: °· White breads, cereals, rice, and pasta. °· Cooked fruits and vegetables or soft fresh fruits and vegetables without the skin. °· Ground or  well-cooked tender beef, ham, veal, lamb, pork, or poultry. °· Eggs and seafood. °· After your diverticulitis symptoms have improved, your caregiver may put you on a high-fiber diet. A high-fiber diet includes 14 grams of fiber for every 1000 calories consumed. For a standard 2000 calorie diet, you would need 28 grams of fiber. Follow these diet guidelines to help you increase the fiber in your diet. It is important to slowly increase the amount fiber in your diet to avoid gas, constipation, and bloating. °· Choose whole-grain breads, cereals, pasta, and brown rice. °· Choose fresh fruits and vegetables with the skin on. Do not overcook vegetables because the more vegetables are cooked, the more fiber is lost. °· Choose more nuts, seeds, legumes, dried peas, beans, and lentils. °· Look for food products that have greater than 3 grams of fiber per serving on the Nutrition Facts label. °· Take all medicine as directed by your caregiver. °· If your caregiver has given you a follow-up appointment, it is very important that you go. Not going could result in lasting (chronic) or permanent injury, pain, and disability. If there is any problem keeping the appointment, call to reschedule. °SEEK MEDICAL CARE IF:  °· Your pain does not improve. °· You have a hard time advancing your diet beyond clear liquids. °· Your bowel movements do not return to normal. °SEEK IMMEDIATE MEDICAL CARE IF:  °· Your pain becomes worse. °· You have an oral temperature above 102° F (38.9° C), not controlled by medicine. °· You have repeated vomiting. °· You have bloody or black, tarry stools. °·   Symptoms that brought you to your caregiver become worse or are not getting better. °MAKE SURE YOU:  °· Understand these instructions. °· Will watch your condition. °· Will get help right away if you are not doing well or get worse. °Document Released: 12/30/2004 Document Revised: 06/14/2011 Document Reviewed: 04/27/2010 °[employer]® Patient Information  ©2014 [employer]. ° °

## 2013-09-11 ENCOUNTER — Ambulatory Visit (INDEPENDENT_AMBULATORY_CARE_PROVIDER_SITE_OTHER): Payer: BC Managed Care – PPO | Admitting: Gastroenterology

## 2013-09-11 ENCOUNTER — Encounter: Payer: Self-pay | Admitting: Gastroenterology

## 2013-09-11 VITALS — BP 100/70 | HR 68 | Ht 71.0 in | Wt 180.2 lb

## 2013-09-11 DIAGNOSIS — K5732 Diverticulitis of large intestine without perforation or abscess without bleeding: Secondary | ICD-10-CM

## 2013-09-11 DIAGNOSIS — K921 Melena: Secondary | ICD-10-CM

## 2013-09-11 DIAGNOSIS — R1032 Left lower quadrant pain: Secondary | ICD-10-CM

## 2013-09-11 MED ORDER — PEG-KCL-NACL-NASULF-NA ASC-C 100 G PO SOLR: 1.0000 | Freq: Once | ORAL | Status: DC

## 2013-09-11 NOTE — Progress Notes (Signed)
    History of Present Illness: This is a 53 year old male who relates worsening left lower quadrant abdominal pain over the course of 2 weeks. He presented to the ED and acute diverticulitis was diagnosed with typical CT scan findings. He was placed on Cipro and Flagyl and his abdominal pain has substantially improved. He noted a small amount of her blood per rectum one or 2 bowel movements prior to beginning antibiotics. He occasionally has crampy left lower quadrant pain that is relieved by bowel movements and the symptoms have been present for years.  Review of Systems: Pertinent positive and negative review of systems were noted in the above HPI section. All other review of systems were otherwise negative.  Current Medications, Allergies, Past Medical History, Past Surgical History, Family History and Social History were reviewed in [employer] record.  Physical Exam: General: Well developed , well nourished, no acute distress Head: Normocephalic and atraumatic Eyes:  sclerae anicteric, EOMI Ears: Normal auditory acuity Mouth: No deformity or lesions Neck: Supple, no masses or thyromegaly Lungs: Clear throughout to auscultation Heart: Regular rate and rhythm; no murmurs, rubs or bruits Abdomen: Soft, non tender and non distended. No masses, hepatosplenomegaly or hernias noted. Normal Bowel sounds Rectal: deferred to colonoscopy Musculoskeletal: Symmetrical with no gross deformities  Skin: No lesions on visible extremities Pulses:  Normal pulses noted Extremities: No clubbing, cyanosis, edema or deformities noted Neurological: Alert oriented x 4, grossly nonfocal Cervical Nodes:  No significant cervical adenopathy Inguinal Nodes: No significant inguinal adenopathy Psychological:  Alert and cooperative. Normal mood and affect  Assessment and Recommendations:  1. Acute diverticulitis. Small-volume hematochezia-suspect a benign anorectal source.Complete course of  Cipro and Flagyl. Maintain a high fiber diet with adequate daily water intake. Schedule colonoscopy to rule out other underlying disorders. The risks, benefits, and alternatives to colonoscopy with possible biopsy and possible polypectomy were discussed with the patient and they consent to proceed.   2. 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

## 2013-09-11 NOTE — Patient Instructions (Addendum)
You have been given a separate informational sheet regarding your tobacco use, the importance of quitting and local resources to help you quit.  It has been recommended to you by your physician that you have a(n) Colonoscopy completed. Per your request, we did not schedule the procedure(s) today since you prefer a Monday. Please contact our office at [phone] next month to check on the schedule for September.   Thank you for choosing me and [facility].  Venita Lick. Pleas Koch., MD., Clementeen Graham  cc: Lanell Persons, MD

## 2013-09-24 ENCOUNTER — Encounter: Payer: Self-pay | Admitting: Gastroenterology

## 2013-09-29 ENCOUNTER — Encounter (HOSPITAL_COMMUNITY): Payer: Self-pay | Admitting: Emergency Medicine

## 2013-09-29 ENCOUNTER — Inpatient Hospital Stay (HOSPITAL_COMMUNITY)
Admission: EM | Admit: 2013-09-29 | Discharge: 2013-10-27 | DRG: 329 | Disposition: A | Discharge status: Home Health | Payer: BC Managed Care – PPO | Attending: General Surgery | Admitting: General Surgery

## 2013-09-29 ENCOUNTER — Emergency Department (HOSPITAL_COMMUNITY): Payer: BC Managed Care – PPO

## 2013-09-29 DIAGNOSIS — B3789 Other sites of candidiasis: Secondary | ICD-10-CM | POA: Diagnosis present

## 2013-09-29 DIAGNOSIS — K630 Abscess of intestine: Secondary | ICD-10-CM | POA: Diagnosis present

## 2013-09-29 DIAGNOSIS — K651 Peritoneal abscess: Secondary | ICD-10-CM | POA: Diagnosis present

## 2013-09-29 DIAGNOSIS — Z8249 Family history of ischemic heart disease and other diseases of the circulatory system: Secondary | ICD-10-CM

## 2013-09-29 DIAGNOSIS — Z880 Allergy status to penicillin: Secondary | ICD-10-CM

## 2013-09-29 DIAGNOSIS — K560 Paralytic ileus: Secondary | ICD-10-CM | POA: Diagnosis not present

## 2013-09-29 DIAGNOSIS — K5720 Diverticulitis of large intestine with perforation and abscess without bleeding: Secondary | ICD-10-CM | POA: Diagnosis present

## 2013-09-29 DIAGNOSIS — K5732 Diverticulitis of large intestine without perforation or abscess without bleeding: Secondary | ICD-10-CM

## 2013-09-29 DIAGNOSIS — K660 Peritoneal adhesions (postprocedural) (postinfection): Secondary | ICD-10-CM | POA: Diagnosis present

## 2013-09-29 DIAGNOSIS — K56609 Unspecified intestinal obstruction, unspecified as to partial versus complete obstruction: Secondary | ICD-10-CM | POA: Diagnosis present

## 2013-09-29 DIAGNOSIS — K219 Gastro-esophageal reflux disease without esophagitis: Secondary | ICD-10-CM | POA: Diagnosis present

## 2013-09-29 DIAGNOSIS — F17200 Nicotine dependence, unspecified, uncomplicated: Secondary | ICD-10-CM | POA: Diagnosis present

## 2013-09-29 DIAGNOSIS — K5289 Other specified noninfective gastroenteritis and colitis: Secondary | ICD-10-CM | POA: Diagnosis present

## 2013-09-29 DIAGNOSIS — K929 Disease of digestive system, unspecified: Secondary | ICD-10-CM | POA: Diagnosis not present

## 2013-09-29 DIAGNOSIS — R799 Abnormal finding of blood chemistry, unspecified: Secondary | ICD-10-CM | POA: Diagnosis not present

## 2013-09-29 DIAGNOSIS — K56600 Partial intestinal obstruction, unspecified as to cause: Secondary | ICD-10-CM

## 2013-09-29 DIAGNOSIS — R109 Unspecified abdominal pain: Secondary | ICD-10-CM

## 2013-09-29 DIAGNOSIS — Y833 Surgical operation with formation of external stoma as the cause of abnormal reaction of the patient, or of later complication, without mention of misadventure at the time of the procedure: Secondary | ICD-10-CM | POA: Diagnosis not present

## 2013-09-29 HISTORY — DX: Polyp of colon: K63.5

## 2013-09-29 HISTORY — DX: Other specified noninfective gastroenteritis and colitis: K52.89

## 2013-09-29 LAB — URINALYSIS, ROUTINE W REFLEX MICROSCOPIC
BILIRUBIN URINE: NEGATIVE
Glucose, UA: NEGATIVE mg/dL
KETONES UR: NEGATIVE mg/dL
NITRITE: NEGATIVE
PH: 5 (ref 5.0–8.0)
Protein, ur: 30 mg/dL — AB
Specific Gravity, Urine: 1.029 (ref 1.005–1.030)
Urobilinogen, UA: 0.2 mg/dL (ref 0.0–1.0)

## 2013-09-29 LAB — COMPREHENSIVE METABOLIC PANEL
ALBUMIN: 3.7 g/dL (ref 3.5–5.2)
ALK PHOS: 72 U/L (ref 39–117)
ALT: 30 U/L (ref 0–53)
AST: 19 U/L (ref 0–37)
BILIRUBIN TOTAL: 1.2 mg/dL (ref 0.3–1.2)
BUN: 20 mg/dL (ref 6–23)
CHLORIDE: 99 meq/L (ref 96–112)
CO2: 23 mEq/L (ref 19–32)
Calcium: 9.4 mg/dL (ref 8.4–10.5)
Creatinine, Ser: 1.21 mg/dL (ref 0.50–1.35)
GFR calc Af Amer: 79 mL/min — ABNORMAL LOW (ref >90)
GFR calc non Af Amer: 68 mL/min — ABNORMAL LOW (ref >90)
GLUCOSE: 118 mg/dL — AB (ref 70–99)
POTASSIUM: 4.3 meq/L (ref 3.7–5.3)
Sodium: 136 mEq/L — ABNORMAL LOW (ref 137–147)
Total Protein: 7.8 g/dL (ref 6.0–8.3)

## 2013-09-29 LAB — CBC WITH DIFFERENTIAL/PLATELET
BASOS PCT: 0 % (ref 0–1)
Basophils Absolute: 0 10*3/uL (ref 0.0–0.1)
Eosinophils Absolute: 0 10*3/uL (ref 0.0–0.7)
Eosinophils Relative: 0 % (ref 0–5)
HCT: 40.2 % (ref 39.0–52.0)
HEMOGLOBIN: 13.8 g/dL (ref 13.0–17.0)
LYMPHS ABS: 0.9 10*3/uL (ref 0.7–4.0)
Lymphocytes Relative: 5 % — ABNORMAL LOW (ref 12–46)
MCH: 31.6 pg (ref 26.0–34.0)
MCHC: 34.3 g/dL (ref 30.0–36.0)
MCV: 92 fL (ref 78.0–100.0)
MONOS PCT: 5 % (ref 3–12)
Monocytes Absolute: 0.9 10*3/uL (ref 0.1–1.0)
NEUTROS ABS: 16.9 10*3/uL — AB (ref 1.7–7.7)
NEUTROS PCT: 90 % — AB (ref 43–77)
Platelets: 263 10*3/uL (ref 150–400)
RBC: 4.37 MIL/uL (ref 4.22–5.81)
RDW: 14.1 % (ref 11.5–15.5)
WBC: 18.7 10*3/uL — AB (ref 4.0–10.5)

## 2013-09-29 LAB — LIPASE, BLOOD: LIPASE: 12 U/L (ref 11–59)

## 2013-09-29 LAB — URINE MICROSCOPIC-ADD ON

## 2013-09-29 MED ORDER — IOHEXOL 300 MG/ML  SOLN
100.0000 mL | Freq: Once | INTRAMUSCULAR | Status: AC | PRN
  Administered 2013-09-29: 100 mL via INTRAVENOUS

## 2013-09-29 MED ORDER — HYDROMORPHONE HCL PF 1 MG/ML IJ SOLN
1.0000 mg | Freq: Once | INTRAMUSCULAR | Status: AC
  Administered 2013-09-29: 1 mg via INTRAVENOUS
  Filled 2013-09-29: qty 1

## 2013-09-29 MED ORDER — IOHEXOL 300 MG/ML  SOLN
50.0000 mL | Freq: Once | INTRAMUSCULAR | Status: AC | PRN
Start: 2013-09-29 — End: 2013-09-29
  Administered 2013-09-29: 50 mL via ORAL

## 2013-09-29 MED ORDER — SODIUM CHLORIDE 0.9 % IV BOLUS (SEPSIS)
1000.0000 mL | Freq: Once | INTRAVENOUS | Status: AC
  Administered 2013-09-29: 1000 mL via INTRAVENOUS

## 2013-09-29 MED ORDER — ONDANSETRON HCL 4 MG/2ML IJ SOLN
4.0000 mg | Freq: Once | INTRAMUSCULAR | Status: AC
  Administered 2013-09-29: 4 mg via INTRAVENOUS
  Filled 2013-09-29: qty 2

## 2013-09-29 MED ORDER — METRONIDAZOLE IN NACL 5-0.79 MG/ML-% IV SOLN
500.0000 mg | Freq: Once | INTRAVENOUS | Status: AC
  Administered 2013-09-29: 500 mg via INTRAVENOUS
  Filled 2013-09-29: qty 100

## 2013-09-29 MED ORDER — SODIUM CHLORIDE 0.9 % IV SOLN
500.0000 mg | Freq: Once | INTRAVENOUS | Status: AC
  Administered 2013-09-29: 500 mg via INTRAVENOUS
  Filled 2013-09-29: qty 500

## 2013-09-29 NOTE — H&P (Signed)
Randy Park is an 53 y.o. male.   Chief Complaint: abdominal pain HPI: The pt is a 53yo bm who presents with lower abdominal pain that started Friday. 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 He went to local hospital where he was started on cipro and flagyl. He states he never saw a doctor so he called his wife to get him and he left the hospital and came here. He denies fever or chill. He denies nausea or vomiting. He has had a couple episodes similar to this in the last 3 months or so. His other episodes resolved with abx. CT shows sigmoid diverticulitis with contained perf and partial sbo  Past Medical History  Diagnosis Date  . GERD (gastroesophageal reflux disease)   . Diverticulosis   . Hiatal hernia     Past Surgical History  Procedure Laterality Date  . Head surgery      Family History  Problem Relation Age of Onset  . Heart disease Mother    Social History:  reports that he has been smoking Cigarettes.  He has been smoking about 2.00 packs per day. He has never used smokeless tobacco. He reports that he drinks alcohol. He reports that he does not use illicit drugs.  Allergies:  Allergies  Allergen Reactions  . Amoxicillin Rash     (Not in a hospital admission)  Results for orders placed during the hospital encounter of 09/29/13 (from the past 48 hour(s))  URINALYSIS, ROUTINE W REFLEX MICROSCOPIC     Status: Abnormal   Collection Time    09/29/13  6:42 PM      Result Value Ref Range   Color, Urine AMBER (*) YELLOW   Comment: BIOCHEMICALS MAY BE AFFECTED BY COLOR   APPearance CLEAR  CLEAR   Specific Gravity, Urine 1.029  1.005 - 1.030   pH 5.0  5.0 - 8.0   Glucose, UA NEGATIVE  NEGATIVE mg/dL   Hgb urine dipstick MODERATE (*) NEGATIVE   Bilirubin Urine NEGATIVE  NEGATIVE   Ketones, ur NEGATIVE  NEGATIVE mg/dL   Protein, ur 30 (*) NEGATIVE mg/dL   Urobilinogen, UA 0.2  0.0 - 1.0 mg/dL   Nitrite NEGATIVE  NEGATIVE   Leukocytes, UA SMALL (*) NEGATIVE   URINE MICROSCOPIC-ADD ON     Status: None   Collection Time    09/29/13  6:42 PM      Result Value Ref Range   RBC / HPF 0-2  <3 RBC/hpf   Urine-Other MUCOUS PRESENT    CBC WITH DIFFERENTIAL     Status: Abnormal   Collection Time    09/29/13  6:48 PM      Result Value Ref Range   WBC 18.7 (*) 4.0 - 10.5 K/uL   RBC 4.37  4.22 - 5.81 MIL/uL   Hemoglobin 13.8  13.0 - 17.0 g/dL   HCT 40.2  39.0 - 52.0 %   MCV 92.0  78.0 - 100.0 fL   MCH 31.6  26.0 - 34.0 pg   MCHC 34.3  30.0 - 36.0 g/dL   RDW 14.1  11.5 - 15.5 %   Platelets 263  150 - 400 K/uL   Neutrophils Relative % 90 (*) 43 - 77 %   Neutro Abs 16.9 (*) 1.7 - 7.7 K/uL   Lymphocytes Relative 5 (*) 12 - 46 %   Lymphs Abs 0.9  0.7 - 4.0 K/uL   Monocytes Relative 5  3 - 12 %   Monocytes Absolute  0.9  0.1 - 1.0 K/uL   Eosinophils Relative 0  0 - 5 %   Eosinophils Absolute 0.0  0.0 - 0.7 K/uL   Basophils Relative 0  0 - 1 %   Basophils Absolute 0.0  0.0 - 0.1 K/uL  COMPREHENSIVE METABOLIC PANEL     Status: Abnormal   Collection Time    09/29/13  6:48 PM      Result Value Ref Range   Sodium 136 (*) 137 - 147 mEq/L   Potassium 4.3  3.7 - 5.3 mEq/L   Chloride 99  96 - 112 mEq/L   CO2 23  19 - 32 mEq/L   Glucose, Bld 118 (*) 70 - 99 mg/dL   BUN 20  6 - 23 mg/dL   Creatinine, Ser 1.21  0.50 - 1.35 mg/dL   Calcium 9.4  8.4 - 10.5 mg/dL   Total Protein 7.8  6.0 - 8.3 g/dL   Albumin 3.7  3.5 - 5.2 g/dL   AST 19  0 - 37 U/L   ALT 30  0 - 53 U/L   Alkaline Phosphatase 72  39 - 117 U/L   Total Bilirubin 1.2  0.3 - 1.2 mg/dL   GFR calc non Af Amer 68 (*) >90 mL/min   GFR calc Af Amer 79 (*) >90 mL/min   Comment: (NOTE)     The eGFR has been calculated using the CKD EPI equation.     This calculation has not been validated in all clinical situations.     eGFR's persistently <90 mL/min signify possible Chronic Kidney     Disease.  LIPASE, BLOOD     Status: None   Collection Time    09/29/13  6:48 PM      Result Value Ref Range    Lipase 12  11 - 59 U/L   Ct Abdomen Pelvis W Contrast  09/29/2013   CLINICAL DATA:  Right lower quadrant and left lower quadrant pain. History of diverticulitis.  EXAM: CT ABDOMEN AND PELVIS WITH CONTRAST  TECHNIQUE: Multidetector CT imaging of the abdomen and pelvis was performed using the standard protocol following bolus administration of intravenous contrast.  CONTRAST:  180mL OMNIPAQUE IOHEXOL 300 MG/ML  SOLN  COMPARISON:  09/06/2013  FINDINGS: BODY WALL: Unremarkable.  LOWER CHEST: Dependent atelectasis bilaterally.  ABDOMEN/PELVIS:  Liver: 18 mm hypervascular mass in the subcapsular segment 6. Better seen on the previous study, there is complete fill-in on the delayed phase which matches blood pool, most consistent with hemangioma.  Biliary: High-density material layering within the gallbladder (Recent abdominal CT per report). There is admixed calcified gallstone based on the previous study.  Pancreas: Unremarkable.  Spleen: Unremarkable.  Adrenals: Unremarkable.  Kidneys and ureters: No hydronephrosis or stone.  Bladder: Unremarkable.  Reproductive: Unremarkable.  Bowel: There is persistent circumferential thickening of the proximal sigmoid colon. The bowel wall thickening has a pattern suggesting submucosal edema rather than enhancing mass. As before, the appearance favors diverticulitis. There is extraluminal gas medial to the affected colonic segment, with the largest pocket measuring 2 cm in maximal diameter. Distal ileum is in close proximity to the contained perforation, and shows circumferential thickening from submucosal edema. At this level, there is transition from dilated small bowel to decompressed bowel. Contrast does traverse the segment, consistent with partial obstruction. Loops are angulated in this region, suggesting inflammatory tethering. There is small volume perihepatic and pelvic ascites without mature fluid-filled abscess. No free pneumoperitoneum. Normal appendix.   Retroperitoneum: No mass or  adenopathy.  Vascular: No acute abnormality.  OSSEOUS: No acute abnormalities.  These results were called by telephone at the time of interpretation on 09/29/2013 at 9:29 PM to Dr. Quincy Carnes , who verbally acknowledged these results.  IMPRESSION: 1. Sigmoid diverticulitis which has progressed to contained perforation. The largest pocket of extraluminal gas is 2 cm in diameter. 2. Reactive ileitis causes high-grade small bowel obstruction. 3. 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 Recommend six-month follow-up CT or MRI, sooner if underlying colonic pathology noted on scheduled colonoscopy. 4. Cholelithiasis.   Electronically Signed   By: Jorje Guild M.D.   On: 09/29/2013 21:31    Review of Systems  Constitutional: Negative.   HENT: Negative.   Eyes: Negative.   Respiratory: Negative.   Cardiovascular: Negative.   Gastrointestinal: Positive for abdominal pain.  Genitourinary: Negative.   Musculoskeletal: Negative.   Skin: Negative.   Neurological: Negative.   Endo/Heme/Allergies: Negative.   Psychiatric/Behavioral: Negative.     Blood pressure 145/105, pulse 109, temperature 98.1 F (36.7 C), temperature source Oral, resp. rate 22, height $RemoveBe'5\' 11"'[id number]$  (1.803 m), weight 190 lb (86.183 kg), SpO2 95.00%. Physical Exam  Constitutional: He is oriented to person, place, and time. He appears well-developed and well-nourished.  HENT:  Head: Normocephalic and atraumatic.  Eyes: Conjunctivae and EOM are normal. Pupils are equal, round, and reactive to light.  Neck: Normal range of motion. Neck supple.  Cardiovascular: Normal rate, regular rhythm and normal heart sounds.   Respiratory: Effort normal and breath sounds normal.  GI: Soft. Bowel sounds are normal.  There is moderate diffuse tenderness that is worse in the lower abdomen. There is not generalized  peritonitis. There are good bs  Musculoskeletal: Normal range of motion.  Neurological: He is alert and oriented to person, place, and time.  Skin: Skin is warm and dry.  Psychiatric: He has a normal mood and affect. His behavior is normal.     Assessment/Plan The pt appears to have sigmoid diverticulitis with contained perf. He has responded well in the past to abx. Given that the perf is small and seems contained I think it would be reasonable to admit him for bowel rest and IV abx. If we can get him better then he may benefit from elective colectomy in 6-8 weeks( I would keep him on abx during that time). If he does not improve then he may need more urgent sigmoid colectomy and colostomy. He is not agreeable to anything that involves a colostomy. He understands the risks involved with his current condition. Will admit  TOTH III,PAUL S 09/29/2013, 10:47 PM

## 2013-09-29 NOTE — ED Notes (Signed)
Patient reports abdominal pain x 2 days. [facility]. Patient was discharged today. Patient was diagnosed with diverticulitis. Patient was told to come to the ED when he returned home today.

## 2013-09-29 NOTE — ED Provider Notes (Signed)
CSN: [id number]     Arrival date & time 09/29/13  1816 History   First MD Initiated Contact with Patient 09/29/13 1909     Chief Complaint  Patient presents with  . Diverticulitis     (Consider location/radiation/quality/duration/timing/severity/associated sxs/prior Treatment) The history is provided by the patient and medical records.   This is a 53 y.o. M with PMH significant for GERD, diverticulosis with recurrent diverticulitis, presenting to the ED for abdominal pain.  Over the past month, patient has had 3 episodes of diverticulitis with LLQ pain and diarrhea. He states he was initially seen in the emergency department, had CT scan performed, and was treated with a course of Cipro and Flagyl. He followed up with GI, Dr. Fuller Plan, and seemed to be improving but was scheduled for colonoscopy for further evaluation. He states he was recently seen and admitted at [facility] in [place]. He had a repeat CT scan yesterday which again confirmed diverticulitis with associated microperforation,. There was no visualized abscess on CT and he was again treated with cipro and flagyl but was discharged earlier today.  Patient states today pain has intensified but is more localized to his right lower abdomen. He has had multiple episodes of vomiting, some with streaks of bright red blood, which he did not have with previous episodes of diverticulitis. One episode of dysuria earlier today with mild hematuria.  No prior hx of kidney stones.  No fever or chills.  Past Medical History  Diagnosis Date  . GERD (gastroesophageal reflux disease)   . Diverticulosis   . Hiatal hernia    Past Surgical History  Procedure Laterality Date  . Head surgery     Family History  Problem Relation Age of Onset  . Heart disease Mother    History  Substance Use Topics  . Smoking status: Current Every Day Smoker -- 2.00 packs/day    Types: Cigarettes  . Smokeless tobacco: Never Used  . Alcohol Use: Yes      Comment: social    Review of Systems  Gastrointestinal: Positive for nausea, vomiting and abdominal pain.  Genitourinary: Positive for dysuria and hematuria.  All other systems reviewed and are negative.     Allergies  Amoxicillin  Home Medications   Prior to Admission medications   Medication Sig Start Date End Date Taking? Authorizing Provider  ciprofloxacin (CIPRO) 500 MG tablet Take 1 tablet (500 mg total) by mouth 2 (two) times daily. 09/06/13   Tiffany Marilu Favre, PA-C  ibuprofen (ADVIL,MOTRIN) 200 MG tablet Take 400 mg by mouth every 6 (six) hours as needed for moderate pain.    Historical Provider, MD  metroNIDAZOLE (FLAGYL) 500 MG tablet Take 1 tablet (500 mg total) by mouth 2 (two) times daily. 09/06/13   Tiffany Marilu Favre, PA-C  ondansetron (ZOFRAN) 4 MG tablet Take 1 tablet (4 mg total) by mouth every 6 (six) hours. 09/06/13   Tiffany Marilu Favre, PA-C  OVER THE COUNTER MEDICATION Apply 1 application topically daily.    Historical Provider, MD  oxyCODONE-acetaminophen (PERCOCET/ROXICET) 5-325 MG per tablet Take 1-2 tablets by mouth every 6 (six) hours as needed for severe pain. 09/06/13   Linus Mako, PA-C  peg 3350 powder (MOVIPREP) 100 G SOLR Take 1 kit (200 g total) by mouth once. 09/11/13   Ladene Artist, MD   BP 145/105  Pulse 109  Temp(Src) 98.1 F (36.7 C) (Oral)  Resp 22  Ht $R'5\' 11"'PR$  (1.803 m)  Wt 190 lb (86.183 kg)  BMI 26.51 kg/m2  SpO2 95%  Physical Exam  Nursing note and vitals reviewed. Constitutional: He is oriented to person, place, and time. He appears well-developed and well-nourished. No distress.  Appears uncomfortable, actively vomiting  HENT:  Head: Normocephalic and atraumatic.  Mouth/Throat: Oropharynx is clear and moist.  Eyes: Conjunctivae and EOM are normal. Pupils are equal, round, and reactive to light.  Neck: Normal range of motion. Neck supple.  Cardiovascular: Normal rate, regular rhythm and normal heart sounds.   Pulmonary/Chest:  Effort normal and breath sounds normal. No respiratory distress. He has no wheezes.  Abdominal: Soft. Bowel sounds are normal. He exhibits distension. There is tenderness in the right lower quadrant and left lower quadrant. There is no guarding and no CVA tenderness.  Abdomen with mild distention compared with normal appearance but remains soft; tenderness in RLQ and LLQ without peritonitis  Musculoskeletal: Normal range of motion.  Neurological: He is alert and oriented to person, place, and time.  Skin: Skin is warm and dry. He is not diaphoretic.  Psychiatric: He has a normal mood and affect.    ED Course  Procedures (including critical care time) Labs Review Labs Reviewed  CBC WITH DIFFERENTIAL - Abnormal; Notable for the following:    WBC 18.7 (*)    Neutrophils Relative % 90 (*)    Neutro Abs 16.9 (*)    Lymphocytes Relative 5 (*)    All other components within normal limits  COMPREHENSIVE METABOLIC PANEL - Abnormal; Notable for the following:    Sodium 136 (*)    Glucose, Bld 118 (*)    GFR calc non Af Amer 68 (*)    GFR calc Af Amer 79 (*)    All other components within normal limits  URINALYSIS, ROUTINE W REFLEX MICROSCOPIC - Abnormal; Notable for the following:    Color, Urine AMBER (*)    Hgb urine dipstick MODERATE (*)    Protein, ur 30 (*)    Leukocytes, UA SMALL (*)    All other components within normal limits  LIPASE, BLOOD  URINE MICROSCOPIC-ADD ON    Imaging Review Ct Abdomen Pelvis W Contrast  09/29/2013   CLINICAL DATA:  Right lower quadrant and left lower quadrant pain. History of diverticulitis.  EXAM: CT ABDOMEN AND PELVIS WITH CONTRAST  TECHNIQUE: Multidetector CT imaging of the abdomen and pelvis was performed using the standard protocol following bolus administration of intravenous contrast.  CONTRAST:  155mL OMNIPAQUE IOHEXOL 300 MG/ML  SOLN  COMPARISON:  09/06/2013  FINDINGS: BODY WALL: Unremarkable.  LOWER CHEST: Dependent atelectasis bilaterally.   ABDOMEN/PELVIS:  Liver: 18 mm hypervascular mass in the subcapsular segment 6. Better seen on the previous study, there is complete fill-in on the delayed phase which matches blood pool, most consistent with hemangioma.  Biliary: High-density material layering within the gallbladder (Recent abdominal CT per report). There is admixed calcified gallstone based on the previous study.  Pancreas: Unremarkable.  Spleen: Unremarkable.  Adrenals: Unremarkable.  Kidneys and ureters: No hydronephrosis or stone.  Bladder: Unremarkable.  Reproductive: Unremarkable.  Bowel: There is persistent circumferential thickening of the proximal sigmoid colon. The bowel wall thickening has a pattern suggesting submucosal edema rather than enhancing mass. As before, the appearance favors diverticulitis. There is extraluminal gas medial to the affected colonic segment, with the largest pocket measuring 2 cm in maximal diameter. Distal ileum is in close proximity to the contained perforation, and shows circumferential thickening from submucosal edema. At this level, there is transition from dilated small  bowel to decompressed bowel. Contrast does traverse the segment, consistent with partial obstruction. Loops are angulated in this region, suggesting inflammatory tethering. There is small volume perihepatic and pelvic ascites without mature fluid-filled abscess. No free pneumoperitoneum. Normal appendix.  Retroperitoneum: No mass or adenopathy.  Vascular: No acute abnormality.  OSSEOUS: No acute abnormalities.  These results were called by telephone at the time of interpretation on 09/29/2013 at 9:29 PM to Dr. Quincy Carnes , who verbally acknowledged these results.  IMPRESSION: 1. Sigmoid diverticulitis which has progressed to contained perforation. The largest pocket of extraluminal gas is 2 cm in diameter. 2. Reactive ileitis causes high-grade small bowel obstruction. 3. 18 mm hypervascular mass in the right hepatic lobe is most consistent  with hemangioma, but was not reported on abdominal CT report from 03/14/2002. Recommend six-month follow-up CT or MRI, sooner if underlying colonic pathology noted on scheduled colonoscopy. 4. Cholelithiasis.   Electronically Signed   By: Jorje Guild M.D.   On: 09/29/2013 21:31     EKG Interpretation None      MDM   Final diagnoses:  Diverticulitis of colon with perforation  Partial small bowel obstruction   52 year old male with recurrent diverticulitis, just released from [facility] in [place] earlier today. Patient states pain has worsened, but is now more localized towards right lower abdomen. On arrival, he is a comfortable-appearing and actively vomiting. He has tenderness in his right lower quadrant and left lower quadrant. No flank pain noted.  Concern for worsening diverticulitis and/or new etiology of his pain including kidney stones, appendicitis, new bowel perforation, abscess formation, etc.  Will repeat labs and update CT scan.  CT scan revealing worsening sigmoid diverticulitis with perforation, as well as high grade partial small bowel obstruction. Pt started on zosyn as he has completed 2 courses of cipro/flagyl without resolution of sx.  Will consult with general surgery for recommendations.  Discussed with Dr. Marlou Starks of general surgery-- will see pt in ED and admit to his service. Recommends primaxin and flagyl given pt has failed cipro/flagyl combo and has penicillin allergy.  abx dosed per pharmacy.  Larene Pickett, PA-C 09/29/13 2321

## 2013-09-29 NOTE — ED Notes (Signed)
Wife(Latisha) requests to be updated via [phone] or Duwayne HeckDanielle (Daughter) [phone]

## 2013-09-30 ENCOUNTER — Encounter (HOSPITAL_COMMUNITY): Payer: Self-pay | Admitting: *Deleted

## 2013-09-30 LAB — BASIC METABOLIC PANEL
BUN: 18 mg/dL (ref 6–23)
CALCIUM: 8.5 mg/dL (ref 8.4–10.5)
CO2: 23 mEq/L (ref 19–32)
Chloride: 104 mEq/L (ref 96–112)
Creatinine, Ser: 1.2 mg/dL (ref 0.50–1.35)
GFR calc Af Amer: 80 mL/min — ABNORMAL LOW (ref >90)
GFR calc non Af Amer: 69 mL/min — ABNORMAL LOW (ref >90)
GLUCOSE: 112 mg/dL — AB (ref 70–99)
POTASSIUM: 4.4 meq/L (ref 3.7–5.3)
SODIUM: 138 meq/L (ref 137–147)

## 2013-09-30 LAB — CBC
HCT: 37.7 % — ABNORMAL LOW (ref 39.0–52.0)
HEMOGLOBIN: 12.8 g/dL — AB (ref 13.0–17.0)
MCH: 31.4 pg (ref 26.0–34.0)
MCHC: 34 g/dL (ref 30.0–36.0)
MCV: 92.4 fL (ref 78.0–100.0)
PLATELETS: 235 10*3/uL (ref 150–400)
RBC: 4.08 MIL/uL — AB (ref 4.22–5.81)
RDW: 14.3 % (ref 11.5–15.5)
WBC: 13.6 10*3/uL — AB (ref 4.0–10.5)

## 2013-09-30 MED ORDER — PANTOPRAZOLE SODIUM 40 MG IV SOLR
40.0000 mg | Freq: Every day | INTRAVENOUS | Status: DC
  Administered 2013-09-30 – 2013-10-25 (×27): 40 mg via INTRAVENOUS
  Filled 2013-09-30 (×28): qty 40

## 2013-09-30 MED ORDER — ONDANSETRON HCL 4 MG/2ML IJ SOLN
4.0000 mg | Freq: Four times a day (QID) | INTRAMUSCULAR | Status: DC | PRN
  Administered 2013-10-01 – 2013-10-10 (×3): 4 mg via INTRAVENOUS
  Filled 2013-09-30 (×3): qty 2

## 2013-09-30 MED ORDER — MORPHINE SULFATE 4 MG/ML IJ SOLN
4.0000 mg | INTRAMUSCULAR | Status: DC | PRN
  Administered 2013-09-30 – 2013-10-01 (×13): 4 mg via INTRAVENOUS
  Filled 2013-09-30 (×15): qty 1

## 2013-09-30 MED ORDER — METRONIDAZOLE IN NACL 5-0.79 MG/ML-% IV SOLN
500.0000 mg | Freq: Three times a day (TID) | INTRAVENOUS | Status: DC
  Administered 2013-09-30: 500 mg via INTRAVENOUS
  Filled 2013-09-30 (×2): qty 100

## 2013-09-30 MED ORDER — KCL IN DEXTROSE-NACL 20-5-0.9 MEQ/L-%-% IV SOLN
INTRAVENOUS | Status: DC
  Administered 2013-09-30 – 2013-10-14 (×17): via INTRAVENOUS
  Filled 2013-09-30 (×33): qty 1000

## 2013-09-30 MED ORDER — HEPARIN SODIUM (PORCINE) 5000 UNIT/ML IJ SOLN
5000.0000 [IU] | Freq: Three times a day (TID) | INTRAMUSCULAR | Status: AC
  Administered 2013-09-30 – 2013-10-04 (×12): 5000 [IU] via SUBCUTANEOUS
  Filled 2013-09-30 (×16): qty 1

## 2013-09-30 MED ORDER — IMIPENEM-CILASTATIN 500 MG IV SOLR
500.0000 mg | Freq: Four times a day (QID) | INTRAVENOUS | Status: AC
  Administered 2013-09-30 – 2013-10-24 (×98): 500 mg via INTRAVENOUS
  Filled 2013-09-30 (×103): qty 500

## 2013-09-30 NOTE — ED Provider Notes (Signed)
Medical screening examination/treatment/procedure(s) were conducted as a shared visit with non-physician practitioner(s) and myself.  I personally evaluated the patient during the encounter.   EKG Interpretation None      Will admit for worsening diverticulitis with microperforation and partial SBO. Zosyn. gsu consultation.symptom control in ER. Will withhold NG at this time  Lyanne CoKevin M Campos, MD 09/30/13 [phone]

## 2013-09-30 NOTE — Progress Notes (Signed)
ANTIBIOTIC CONSULT NOTE - INITIAL  Pharmacy Consult for primaxin Indication: sigmoid diverticulitis with contained perf  Allergies  Allergen Reactions  . Amoxicillin Rash    Patient Measurements: Height: 5\' 11"  (180.3 cm) Weight: 190 lb (86.183 kg) IBW/kg (Calculated) : 75.3 Adjusted Body Weight:   Vital Signs: Temp: 98.3 F (36.8 C) (06/28 0028) Temp src: Oral (06/28 0028) BP: 128/80 mmHg (06/28 0028) Pulse Rate: 82 (06/28 0028) Intake/Output from previous day: 06/27 0701 - 06/28 0700 In: -  Out: 200 [Emesis/NG output:200] Intake/Output from this shift:    Labs:  Recent Labs  09/29/13 1848  WBC 18.7*  HGB 13.8  PLT 263  CREATININE 1.21   Estimated Creatinine Clearance: 77.8 ml/min (by C-G formula based on Cr of 1.21). No results found for this basename: VANCOTROUGH, VANCOPEAK, VANCORANDOM, GENTTROUGH, GENTPEAK, GENTRANDOM, TOBRATROUGH, TOBRAPEAK, TOBRARND, AMIKACINPEAK, AMIKACINTROU, AMIKACIN,  in the last 72 hours   Microbiology: No results found for this or any previous visit (from the past 720 hour(s)).  Medical History: Past Medical History  Diagnosis Date  . GERD (gastroesophageal reflux disease)   . Diverticulosis   . Hiatal hernia     Medications:  Anti-infectives   Start     Dose/Rate Route Frequency Ordered Stop   09/30/13 0600  metroNIDAZOLE (FLAGYL) IVPB 500 mg     500 mg 100 mL/hr over 60 Minutes Intravenous Every 8 hours 09/30/13 0026     09/29/13 2200  imipenem-cilastatin (PRIMAXIN) 500 mg in sodium chloride 0.9 % 100 mL IVPB     500 mg 200 mL/hr over 30 Minutes Intravenous  Once 09/29/13 2157 09/29/13 2353   09/29/13 2200  metroNIDAZOLE (FLAGYL) IVPB 500 mg     500 mg 100 mL/hr over 60 Minutes Intravenous  Once 09/29/13 2157 09/29/13 2320     Assessment: Patient with sigmoid diverticulitis with contained perf.  First dose of antibiotics already given.    Goal of Therapy:  Primaxin dosed based on patient weight and renal  function   Plan:  Follow up culture results Primaxin 500mg  iv q6hr  Darlina GuysGrimsley Jr, Julian Crowford 09/30/2013,1:34 AM

## 2013-09-30 NOTE — Progress Notes (Signed)
Subjective: He feels better today. No fever. Wbc coming down  Objective: Vital signs in last 24 hours: Temp:  [98.1 F (36.7 C)-98.3 F (36.8 C)] 98.1 F (36.7 C) (06/28 1031) Pulse Rate:  [77-109] 77 (06/28 1031) Resp:  [13-22] 18 (06/28 1031) BP: (108-145)/(73-105) 120/77 mmHg (06/28 1031) SpO2:  [94 %-96 %] 96 % (06/28 1031) Weight:  [190 lb (86.183 kg)] 190 lb (86.183 kg) (06/27 1838) Last BM Date: 09/28/13  Intake/Output from previous day: 06/27 0701 - 06/28 0700 In: -  Out: 200 [Emesis/NG output:200] Intake/Output this shift:    Resp: clear to auscultation bilaterally Cardio: regular rate and rhythm GI: soft, distended. still quite tender on left but improving  Lab Results:   Recent Labs  09/29/13 1848 09/30/13 0500  WBC 18.7* 13.6*  HGB 13.8 12.8*  HCT 40.2 37.7*  PLT 263 235   BMET  Recent Labs  09/29/13 1848 09/30/13 0500  NA 136* 138  K 4.3 4.4  CL 99 104  CO2 23 23  GLUCOSE 118* 112*  BUN 20 18  CREATININE 1.21 1.20  CALCIUM 9.4 8.5   PT/INR No results found for this basename: LABPROT, INR,  in the last 72 hours ABG No results found for this basename: PHART, PCO2, PO2, HCO3,  in the last 72 hours  Studies/Results: Ct Abdomen Pelvis W Contrast  09/29/2013   CLINICAL DATA:  Right lower quadrant and left lower quadrant pain. History of diverticulitis.  EXAM: CT ABDOMEN AND PELVIS WITH CONTRAST  TECHNIQUE: Multidetector CT imaging of the abdomen and pelvis was performed using the standard protocol following bolus administration of intravenous contrast.  CONTRAST:  100mL OMNIPAQUE IOHEXOL 300 MG/ML  SOLN  COMPARISON:  09/06/2013  FINDINGS: BODY WALL: Unremarkable.  LOWER CHEST: Dependent atelectasis bilaterally.  ABDOMEN/PELVIS:  Liver: 18 mm hypervascular mass in the subcapsular segment 6. Better seen on the previous study, there is complete fill-in on the delayed phase which matches blood pool, most consistent with hemangioma.  Biliary:  High-density material layering within the gallbladder (Recent abdominal CT per report). There is admixed calcified gallstone based on the previous study.  Pancreas: Unremarkable.  Spleen: Unremarkable.  Adrenals: Unremarkable.  Kidneys and ureters: No hydronephrosis or stone.  Bladder: Unremarkable.  Reproductive: Unremarkable.  Bowel: There is persistent circumferential thickening of the proximal sigmoid colon. The bowel wall thickening has a pattern suggesting submucosal edema rather than enhancing mass. As before, the appearance favors diverticulitis. There is extraluminal gas medial to the affected colonic segment, with the largest pocket measuring 2 cm in maximal diameter. Distal ileum is in close proximity to the contained perforation, and shows circumferential thickening from submucosal edema. At this level, there is transition from dilated small bowel to decompressed bowel. Contrast does traverse the segment, consistent with partial obstruction. Loops are angulated in this region, suggesting inflammatory tethering. There is small volume perihepatic and pelvic ascites without mature fluid-filled abscess. No free pneumoperitoneum. Normal appendix.  Retroperitoneum: No mass or adenopathy.  Vascular: No acute abnormality.  OSSEOUS: No acute abnormalities.  These results were called by telephone at the time of interpretation on 09/29/2013 at 9:29 PM to Dr. Sharilyn SitesLISA SANDERS , who verbally acknowledged these results.  IMPRESSION: 1. Sigmoid diverticulitis which has progressed to contained perforation. The largest pocket of extraluminal gas is 2 cm in diameter. 2. Reactive ileitis causes high-grade small bowel obstruction. 3. 18 mm hypervascular mass in the right hepatic lobe is most consistent with hemangioma, but was not reported on abdominal CT report  from 03/14/2002. Recommend six-month follow-up CT or MRI, sooner if underlying colonic pathology noted on scheduled colonoscopy. 4. Cholelithiasis.   Electronically  Signed   By: Tiburcio PeaJonathan  Watts M.D.   On: 09/29/2013 21:31    Anti-infectives: Anti-infectives   Start     Dose/Rate Route Frequency Ordered Stop   09/30/13 0600  metroNIDAZOLE (FLAGYL) IVPB 500 mg     500 mg 100 mL/hr over 60 Minutes Intravenous Every 8 hours 09/30/13 0026     09/30/13 0600  imipenem-cilastatin (PRIMAXIN) 500 mg in sodium chloride 0.9 % 100 mL IVPB     500 mg 200 mL/hr over 30 Minutes Intravenous 4 times per day 09/30/13 0136     09/29/13 2200  imipenem-cilastatin (PRIMAXIN) 500 mg in sodium chloride 0.9 % 100 mL IVPB     500 mg 200 mL/hr over 30 Minutes Intravenous  Once 09/29/13 2157 09/29/13 2353   09/29/13 2200  metroNIDAZOLE (FLAGYL) IVPB 500 mg     500 mg 100 mL/hr over 60 Minutes Intravenous  Once 09/29/13 2157 09/29/13 2320      Assessment/Plan: s/p * No surgery found * continue bowel rest Continue primaxin and flagyl Monitor closely  LOS: 1 day    TOTH III,PAUL S 09/30/2013

## 2013-10-01 MED ORDER — ACETAMINOPHEN 325 MG PO TABS
650.0000 mg | ORAL_TABLET | Freq: Four times a day (QID) | ORAL | Status: DC | PRN
  Administered 2013-10-14: 650 mg via ORAL
  Filled 2013-10-01: qty 2

## 2013-10-01 MED ORDER — ACETAMINOPHEN 160 MG/5ML PO SOLN
650.0000 mg | ORAL | Status: DC | PRN
Start: 2013-10-01 — End: 2013-10-08

## 2013-10-01 MED ORDER — BOOST / RESOURCE BREEZE PO LIQD
1.0000 | Freq: Two times a day (BID) | ORAL | Status: DC
  Administered 2013-10-01 – 2013-10-02 (×3): 1 via ORAL

## 2013-10-01 MED ORDER — HYDROMORPHONE HCL PF 1 MG/ML IJ SOLN
0.5000 mg | INTRAMUSCULAR | Status: DC | PRN
  Administered 2013-10-01 – 2013-10-02 (×7): 2 mg via INTRAVENOUS
  Filled 2013-10-01 (×7): qty 2

## 2013-10-01 MED ORDER — OXYCODONE HCL 5 MG/5ML PO SOLN
5.0000 mg | ORAL | Status: DC | PRN
  Administered 2013-10-02 (×3): 10 mg via ORAL
  Filled 2013-10-01 (×4): qty 10

## 2013-10-01 NOTE — Progress Notes (Signed)
Patient ID: Randy Park, male   DOB: 23-Jul-1960, 53 y.o.   MRN: [id number]  Subjective: Pain has improved, now moved up to upper abdomen.  Sensation to defecate, no diarrhea.  No n/v.  Afebrile.  VSS.  Complaining that the bed is uncomfortable.  Objective:  Vital signs:  Filed Vitals:   09/30/13 1442 09/30/13 2235 10/01/13 0209 10/01/13 0645  BP: 138/82 134/76 132/82 130/79  Pulse: 91 79 83 77  Temp: 98.6 F (37 C) 99 F (37.2 C) 98.6 F (37 C) 98.5 F (36.9 C)  TempSrc: Oral Oral Oral Oral  Resp: $Remo'18 18 17 20  'QASii$ Height:      Weight:      SpO2: 92% 94% 94% 94%    Last BM Date: 09/29/13  Intake/Output   Yesterday:    This shift:      Physical Exam: General: Pt awake/alert/oriented x4 in no acute distress Chest: CTA.  No chest wall pain w good excursion CV:  Pulses intact.  Regular rhythm MS: Normal AROM mjr joints.  No obvious deformity Abdomen: Soft.  Nondistended.ttp to upper abdomen.  No evidence of peritonitis.  No incarcerated hernias. Ext:  SCDs BLE.  No mjr edema.  No cyanosis Skin: No petechiae / purpura   Problem List:   Active Problems:   Diverticulitis of colon with perforation    Results:   Labs: Results for orders placed during the hospital encounter of 09/29/13 (from the past 48 hour(s))  URINALYSIS, ROUTINE W REFLEX MICROSCOPIC     Status: Abnormal   Collection Time    09/29/13  6:42 PM      Result Value Ref Range   Color, Urine AMBER (*) YELLOW   Comment: BIOCHEMICALS MAY BE AFFECTED BY COLOR   APPearance CLEAR  CLEAR   Specific Gravity, Urine 1.029  1.005 - 1.030   pH 5.0  5.0 - 8.0   Glucose, UA NEGATIVE  NEGATIVE mg/dL   Hgb urine dipstick MODERATE (*) NEGATIVE   Bilirubin Urine NEGATIVE  NEGATIVE   Ketones, ur NEGATIVE  NEGATIVE mg/dL   Protein, ur 30 (*) NEGATIVE mg/dL   Urobilinogen, UA 0.2  0.0 - 1.0 mg/dL   Nitrite NEGATIVE  NEGATIVE   Leukocytes, UA SMALL (*) NEGATIVE  URINE MICROSCOPIC-ADD ON     Status: None    Collection Time    09/29/13  6:42 PM      Result Value Ref Range   RBC / HPF 0-2  <3 RBC/hpf   Urine-Other MUCOUS PRESENT    CBC WITH DIFFERENTIAL     Status: Abnormal   Collection Time    09/29/13  6:48 PM      Result Value Ref Range   WBC 18.7 (*) 4.0 - 10.5 K/uL   RBC 4.37  4.22 - 5.81 MIL/uL   Hemoglobin 13.8  13.0 - 17.0 g/dL   HCT 40.2  39.0 - 52.0 %   MCV 92.0  78.0 - 100.0 fL   MCH 31.6  26.0 - 34.0 pg   MCHC 34.3  30.0 - 36.0 g/dL   RDW 14.1  11.5 - 15.5 %   Platelets 263  150 - 400 K/uL   Neutrophils Relative % 90 (*) 43 - 77 %   Neutro Abs 16.9 (*) 1.7 - 7.7 K/uL   Lymphocytes Relative 5 (*) 12 - 46 %   Lymphs Abs 0.9  0.7 - 4.0 K/uL   Monocytes Relative 5  3 - 12 %   Monocytes Absolute 0.9  0.1 - 1.0 K/uL   Eosinophils Relative 0  0 - 5 %   Eosinophils Absolute 0.0  0.0 - 0.7 K/uL   Basophils Relative 0  0 - 1 %   Basophils Absolute 0.0  0.0 - 0.1 K/uL  COMPREHENSIVE METABOLIC PANEL     Status: Abnormal   Collection Time    09/29/13  6:48 PM      Result Value Ref Range   Sodium 136 (*) 137 - 147 mEq/L   Potassium 4.3  3.7 - 5.3 mEq/L   Chloride 99  96 - 112 mEq/L   CO2 23  19 - 32 mEq/L   Glucose, Bld 118 (*) 70 - 99 mg/dL   BUN 20  6 - 23 mg/dL   Creatinine, Ser 1.21  0.50 - 1.35 mg/dL   Calcium 9.4  8.4 - 10.5 mg/dL   Total Protein 7.8  6.0 - 8.3 g/dL   Albumin 3.7  3.5 - 5.2 g/dL   AST 19  0 - 37 U/L   ALT 30  0 - 53 U/L   Alkaline Phosphatase 72  39 - 117 U/L   Total Bilirubin 1.2  0.3 - 1.2 mg/dL   GFR calc non Af Amer 68 (*) >90 mL/min   GFR calc Af Amer 79 (*) >90 mL/min   Comment: (NOTE)     The eGFR has been calculated using the CKD EPI equation.     This calculation has not been validated in all clinical situations.     eGFR's persistently <90 mL/min signify possible Chronic Kidney     Disease.  LIPASE, BLOOD     Status: None   Collection Time    09/29/13  6:48 PM      Result Value Ref Range   Lipase 12  11 - 59 U/L  BASIC METABOLIC  PANEL     Status: Abnormal   Collection Time    09/30/13  5:00 AM      Result Value Ref Range   Sodium 138  137 - 147 mEq/L   Potassium 4.4  3.7 - 5.3 mEq/L   Chloride 104  96 - 112 mEq/L   CO2 23  19 - 32 mEq/L   Glucose, Bld 112 (*) 70 - 99 mg/dL   BUN 18  6 - 23 mg/dL   Creatinine, Ser 1.20  0.50 - 1.35 mg/dL   Calcium 8.5  8.4 - 10.5 mg/dL   GFR calc non Af Amer 69 (*) >90 mL/min   GFR calc Af Amer 80 (*) >90 mL/min   Comment: (NOTE)     The eGFR has been calculated using the CKD EPI equation.     This calculation has not been validated in all clinical situations.     eGFR's persistently <90 mL/min signify possible Chronic Kidney     Disease.  CBC     Status: Abnormal   Collection Time    09/30/13  5:00 AM      Result Value Ref Range   WBC 13.6 (*) 4.0 - 10.5 K/uL   RBC 4.08 (*) 4.22 - 5.81 MIL/uL   Hemoglobin 12.8 (*) 13.0 - 17.0 g/dL   HCT 37.7 (*) 39.0 - 52.0 %   MCV 92.4  78.0 - 100.0 fL   MCH 31.4  26.0 - 34.0 pg   MCHC 34.0  30.0 - 36.0 g/dL   RDW 14.3  11.5 - 15.5 %   Platelets 235  150 - 400 K/uL  Imaging / Studies: Ct Abdomen Pelvis W Contrast  09/29/2013   CLINICAL DATA:  Right lower quadrant and left lower quadrant pain. History of diverticulitis.  EXAM: CT ABDOMEN AND PELVIS WITH CONTRAST  TECHNIQUE: Multidetector CT imaging of the abdomen and pelvis was performed using the standard protocol following bolus administration of intravenous contrast.  CONTRAST:  139mL OMNIPAQUE IOHEXOL 300 MG/ML  SOLN  COMPARISON:  09/06/2013  FINDINGS: BODY WALL: Unremarkable.  LOWER CHEST: Dependent atelectasis bilaterally.  ABDOMEN/PELVIS:  Liver: 18 mm hypervascular mass in the subcapsular segment 6. Better seen on the previous study, there is complete fill-in on the delayed phase which matches blood pool, most consistent with hemangioma.  Biliary: High-density material layering within the gallbladder (Recent abdominal CT per report). There is admixed calcified gallstone based on  the previous study.  Pancreas: Unremarkable.  Spleen: Unremarkable.  Adrenals: Unremarkable.  Kidneys and ureters: No hydronephrosis or stone.  Bladder: Unremarkable.  Reproductive: Unremarkable.  Bowel: There is persistent circumferential thickening of the proximal sigmoid colon. The bowel wall thickening has a pattern suggesting submucosal edema rather than enhancing mass. As before, the appearance favors diverticulitis. There is extraluminal gas medial to the affected colonic segment, with the largest pocket measuring 2 cm in maximal diameter. Distal ileum is in close proximity to the contained perforation, and shows circumferential thickening from submucosal edema. At this level, there is transition from dilated small bowel to decompressed bowel. Contrast does traverse the segment, consistent with partial obstruction. Loops are angulated in this region, suggesting inflammatory tethering. There is small volume perihepatic and pelvic ascites without mature fluid-filled abscess. No free pneumoperitoneum. Normal appendix.  Retroperitoneum: No mass or adenopathy.  Vascular: No acute abnormality.  OSSEOUS: No acute abnormalities.  These results were called by telephone at the time of interpretation on 09/29/2013 at 9:29 PM to Dr. Quincy Carnes , who verbally acknowledged these results.  IMPRESSION: 1. Sigmoid diverticulitis which has progressed to contained perforation. The largest pocket of extraluminal gas is 2 cm in diameter. 2. Reactive ileitis causes high-grade small bowel obstruction. 3. 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 Recommend six-month follow-up CT or MRI, sooner if underlying colonic pathology noted on scheduled colonoscopy. 4. Cholelithiasis.   Electronically Signed   By: Jorje Guild M.D.   On: 09/29/2013 21:31    Scheduled Meds: . heparin  5,000 Units Subcutaneous 3 times per day  .  imipenem-cilastatin  500 mg Intravenous 4 times per day  . pantoprazole (PROTONIX) IV  40 mg Intravenous QHS   Continuous Infusions: . dextrose 5 % and 0.9 % NaCl with KCl 20 mEq/L 125 mL/hr at 10/01/13 0502   PRN Meds:.morphine injection, ondansetron   Antibiotics: Anti-infectives   Start     Dose/Rate Route Frequency Ordered Stop   09/30/13 0600  metroNIDAZOLE (FLAGYL) IVPB 500 mg  Status:  Discontinued     500 mg 100 mL/hr over 60 Minutes Intravenous Every 8 hours 09/30/13 0026 09/30/13 1247   09/30/13 0600  imipenem-cilastatin (PRIMAXIN) 500 mg in sodium chloride 0.9 % 100 mL IVPB     500 mg 200 mL/hr over 30 Minutes Intravenous 4 times per day 09/30/13 0136     09/29/13 2200  imipenem-cilastatin (PRIMAXIN) 500 mg in sodium chloride 0.9 % 100 mL IVPB     500 mg 200 mL/hr over 30 Minutes Intravenous  Once 09/29/13 2157 09/29/13 2353   09/29/13 2200  metroNIDAZOLE (FLAGYL) IVPB 500 mg     500 mg 100 mL/hr over 60 Minutes Intravenous  Once 09/29/13 2157 09/29/13 2320      Assessment/Plan Sigmoid diverticulitis with contained perforation(2nd episode in 1 month) Reactive ileitis  -continue with conservative management, IV antibiotics.  Understands if he worsens or does not improve with conservative management that he may require surgery this admission. -allow for clear liquid diet -repeat labs in AM -will need a colonoscopy in 4-6 weeks   Erby Pian, [facility] Surgery Pager [phone] Office [phone]  10/01/2013 8:59 AM

## 2013-10-01 NOTE — Progress Notes (Signed)
He is complaining of severe pain, not relieved by Morphine.  He took 40 mg of morphine yesterday, and has had 20 mg of IV morphine today.  Plan to switch to dilaudid and add, percocet.  His creatinine is borderline at 1.2.   Labs ordered for AM. He is on a clear diet, no home meds before diverticulitis.

## 2013-10-01 NOTE — Progress Notes (Signed)
INITIAL NUTRITION ASSESSMENT  DOCUMENTATION CODES Per approved criteria  -Not Applicable   INTERVENTION: - Diet advancement per MD - Assisted pt with ordering lunch  - Resource Breeze BID - RD to continue to monitor   NUTRITION DIAGNOSIS: Inadequate oral intake related to clear liquid diet as evidenced by diet order.   Goal: 1. Advance diet as tolerated to soft diet 2. Resolution of abdominal pain   Monitor:  Weights, labs, diet advancement, abdominal pain  Reason for Assessment: Malnutrition screening tool   53 y.o. male  Admitting Dx: Abdominal pain   ASSESSMENT: Pt presents with lower abdominal pain that started Friday. 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 He went to local hospital where he was started on cipro and flagyl. CT shows sigmoid diverticulitis with contained perforation and partial small bowel obstruction per MD notes.   -Pt discussed during multidisciplinary rounds.  -Met with pt who reports eating the "wrong" kind of foods PTA, 4-5 meals/day of mostly fast foods -States his weight has been stable -Denies any diarrhea since admission, last emesis was Friday, abdominal pain reportedly has remained the same since admission -Appeared uncomfortable, did not perform nutrition focused physical exam -Starting clear liquids today   Height: Ht Readings from Last 1 Encounters:  09/29/13 _0  (1.803 m)    Weight: Wt Readings from Last 1 Encounters:  09/29/13 190 lb (86.183 kg)    Ideal Body Weight: 172 lbs   % Ideal Body Weight: 110%  Wt Readings from Last 10 Encounters:  09/29/13 190 lb (86.183 kg)  09/11/13 180 lb 3.2 oz (81.738 kg)    Usual Body Weight: 190 lbs per pt  % Usual Body Weight: 100%  BMI:  Body mass index is 26.51 kg/(m^2).  Estimated Nutritional Needs: Kcal: 1950-2150 Protein: 90-110g Fluid: 1.9-2.1L/day   Skin: Intact   Diet Order: Clear Liquid  EDUCATION NEEDS: -Education needs addressed - discussed diet therapy for  diverticulitis. RN had already printed out some of this information which pt had at bedside.   No intake or output data in the 24 hours ending 10/01/13 1102  Last BM: 6/27  Labs:   Recent Labs Lab 09/29/13 1848 09/30/13 0500  NA 136* 138  K 4.3 4.4  CL 99 104  CO2 23 23  BUN 20 18  CREATININE 1.21 1.20  CALCIUM 9.4 8.5  GLUCOSE 118* 112*    CBG (last 3)  No results found for this basename: GLUCAP,  in the last 72 hours  Scheduled Meds: . heparin  5,000 Units Subcutaneous 3 times per day  . imipenem-cilastatin  500 mg Intravenous 4 times per day  . pantoprazole (PROTONIX) IV  40 mg Intravenous QHS    Continuous Infusions: . dextrose 5 % and 0.9 % NaCl with KCl 20 mEq/L 125 mL/hr at 10/01/13 0502    Past Medical History  Diagnosis Date  . GERD (gastroesophageal reflux disease)   . Diverticulosis   . Hiatal hernia     Past Surgical History  Procedure Laterality Date  . Head surgery      Carlis Stable MS, RD, [place] Pager [phone] Weekend/After Hours Pager

## 2013-10-01 NOTE — Progress Notes (Signed)
General Surgery [facility]  Patient seen and examined.  CT scan reviewed.  Patient seated at bedside taking clear liquids slowly.  Encouraged to ambulate.  Will follow closely.  Hopefully will resolve with non-operative management this hospitalization.  Velora Hecklerodd M. Gerkin, MD, [facility] Office: [phone]

## 2013-10-02 LAB — BASIC METABOLIC PANEL
BUN: 9 mg/dL (ref 6–23)
CALCIUM: 8.6 mg/dL (ref 8.4–10.5)
CO2: 21 meq/L (ref 19–32)
CREATININE: 0.91 mg/dL (ref 0.50–1.35)
Chloride: 104 mEq/L (ref 96–112)
GFR calc Af Amer: >90 mL/min (ref >90)
GFR calc non Af Amer: >90 mL/min (ref >90)
GLUCOSE: 102 mg/dL — AB (ref 70–99)
Potassium: 3.9 mEq/L (ref 3.7–5.3)
Sodium: 136 mEq/L — ABNORMAL LOW (ref 137–147)

## 2013-10-02 LAB — CBC
HCT: 32.8 % — ABNORMAL LOW (ref 39.0–52.0)
HEMOGLOBIN: 11.1 g/dL — AB (ref 13.0–17.0)
MCH: 31.2 pg (ref 26.0–34.0)
MCHC: 33.8 g/dL (ref 30.0–36.0)
MCV: 92.1 fL (ref 78.0–100.0)
Platelets: 236 10*3/uL (ref 150–400)
RBC: 3.56 MIL/uL — ABNORMAL LOW (ref 4.22–5.81)
RDW: 13.9 % (ref 11.5–15.5)
WBC: 7.9 10*3/uL (ref 4.0–10.5)

## 2013-10-02 MED ORDER — SIMETHICONE 80 MG PO CHEW
160.0000 mg | CHEWABLE_TABLET | Freq: Four times a day (QID) | ORAL | Status: DC | PRN
  Administered 2013-10-02: 160 mg via ORAL
  Filled 2013-10-02: qty 2

## 2013-10-02 MED ORDER — HYDROMORPHONE HCL PF 1 MG/ML IJ SOLN
0.5000 mg | INTRAMUSCULAR | Status: DC | PRN
  Administered 2013-10-02 (×4): 1 mg via INTRAVENOUS
  Filled 2013-10-02 (×4): qty 1

## 2013-10-02 NOTE — Progress Notes (Signed)
  Subjective: He is still complaining of pain, but says he is much better.  He says his belly is still hard, but he doesn't have one area that seems to be painful by his or my exam.  No BM, he says he is walking.    Objective: Vital signs in last 24 hours: Temp:  [98.2 F (36.8 C)-98.6 F (37 C)] 98.6 F (37 C) (06/30 0629) Pulse Rate:  [72-79] 72 (06/30 0629) Resp:  [18-20] 20 (06/30 0629) BP: (141-144)/(79-83) 141/83 mmHg (06/30 0629) SpO2:  [91 %-95 %] 95 % (06/30 0629) Last BM Date: 09/29/13 Diet: clears Day 3 of antibiotics completed starting day 4 240 PO recorded, no output recorded. Afebrile, VSS, BP up some BMP OK WBC is better this AM 8 mg of dilaudid yesterday; 20 mg of morphine yesterday. 6mg   Dilaudid so far today No used oxycodone used yesterday.  Intake/Output from previous day: 06/29 0701 - 06/30 0700 In: 4590 [P.O.:240; I.V.:3750; IV Piggyback:600] Out: -  Intake/Output this shift:    General appearance: alert, cooperative and no distress Resp: clear to auscultation bilaterally GI: soft, still distended some, but not really tender, + BS  Lab Results:   Recent Labs  09/30/13 0500 10/02/13 0525  WBC 13.6* 7.9  HGB 12.8* 11.1*  HCT 37.7* 32.8*  PLT 235 236    BMET  Recent Labs  09/30/13 0500 10/02/13 0525  NA 138 136*  K 4.4 3.9  CL 104 104  CO2 23 21  GLUCOSE 112* 102*  BUN 18 9  CREATININE 1.20 0.91  CALCIUM 8.5 8.6   PT/INR No results found for this basename: LABPROT, INR,  in the last 72 hours   Recent Labs Lab 09/29/13 1848  AST 19  ALT 30  ALKPHOS 72  BILITOT 1.2  PROT 7.8  ALBUMIN 3.7     Lipase     Component Value Date/Time   LIPASE 12 09/29/2013 1848     Studies/Results: No results found.  Medications: . feeding supplement (RESOURCE BREEZE)  1 Container Oral BID BM  . heparin  5,000 Units Subcutaneous 3 times per day  . imipenem-cilastatin  500 mg Intravenous 4 times per day  . pantoprazole (PROTONIX) IV   40 mg Intravenous QHS    Assessment/Plan Recurrent  Sigmoid diverticulitis with contained perforation Ileitis Pain control   Plan:  I am going to leave him on clears, I told him I want him to cut back on IV pain medicines and use PO meds first.  I told him I would increase his diet when he was using less IV pain medicine and had a BM.  I have decreased the dilaudid and time frequency.    LOS: 3 days    JENNINGS,WILLARD 10/02/2013

## 2013-10-02 NOTE — Progress Notes (Signed)
General Surgery [facility]  Patient seen and examined.  Clinically making progress.  Patient worried about rectus diastasis - he and I discussed - no intervention required.  Will follow - may need repeat CT before discharge depending on clinical progress.  Velora Hecklerodd M. Gerkin, MD, [facility] Office: [phone]

## 2013-10-03 ENCOUNTER — Inpatient Hospital Stay (HOSPITAL_COMMUNITY): Payer: BC Managed Care – PPO

## 2013-10-03 DIAGNOSIS — R143 Flatulence: Secondary | ICD-10-CM

## 2013-10-03 DIAGNOSIS — R141 Gas pain: Secondary | ICD-10-CM

## 2013-10-03 DIAGNOSIS — R142 Eructation: Secondary | ICD-10-CM

## 2013-10-03 LAB — BASIC METABOLIC PANEL
BUN: 9 mg/dL (ref 6–23)
CALCIUM: 8.9 mg/dL (ref 8.4–10.5)
CO2: 21 mEq/L (ref 19–32)
Chloride: 101 mEq/L (ref 96–112)
Creatinine, Ser: 0.95 mg/dL (ref 0.50–1.35)
GFR calc Af Amer: >90 mL/min (ref >90)
GFR calc non Af Amer: >90 mL/min (ref >90)
Glucose, Bld: 106 mg/dL — ABNORMAL HIGH (ref 70–99)
POTASSIUM: 3.9 meq/L (ref 3.7–5.3)
Sodium: 136 mEq/L — ABNORMAL LOW (ref 137–147)

## 2013-10-03 LAB — CBC
HCT: 35.1 % — ABNORMAL LOW (ref 39.0–52.0)
HEMOGLOBIN: 12.6 g/dL — AB (ref 13.0–17.0)
MCH: 32.7 pg (ref 26.0–34.0)
MCHC: 35.9 g/dL (ref 30.0–36.0)
MCV: 91.2 fL (ref 78.0–100.0)
Platelets: 275 10*3/uL (ref 150–400)
RBC: 3.85 MIL/uL — ABNORMAL LOW (ref 4.22–5.81)
RDW: 13.9 % (ref 11.5–15.5)
WBC: 8.5 10*3/uL (ref 4.0–10.5)

## 2013-10-03 MED ORDER — IOHEXOL 300 MG/ML  SOLN
25.0000 mL | INTRAMUSCULAR | Status: AC
  Administered 2013-10-03 (×2): 25 mL via ORAL

## 2013-10-03 MED ORDER — HYDROMORPHONE HCL PF 1 MG/ML IJ SOLN
1.0000 mg | INTRAMUSCULAR | Status: DC | PRN
  Administered 2013-10-03 (×2): 2 mg via INTRAVENOUS
  Administered 2013-10-03: 1 mg via INTRAVENOUS
  Administered 2013-10-03 – 2013-10-04 (×10): 2 mg via INTRAVENOUS
  Administered 2013-10-04: 1 mg via INTRAVENOUS
  Administered 2013-10-04 – 2013-10-05 (×5): 2 mg via INTRAVENOUS
  Administered 2013-10-05: 1 mg via INTRAVENOUS
  Administered 2013-10-05 – 2013-10-06 (×7): 2 mg via INTRAVENOUS
  Administered 2013-10-07: 1 mg via INTRAVENOUS
  Administered 2013-10-07 (×2): 2 mg via INTRAVENOUS
  Administered 2013-10-07 (×3): 1 mg via INTRAVENOUS
  Administered 2013-10-07 – 2013-10-08 (×5): 2 mg via INTRAVENOUS
  Administered 2013-10-08 (×2): 1 mg via INTRAVENOUS
  Filled 2013-10-03 (×19): qty 2
  Filled 2013-10-03: qty 1
  Filled 2013-10-03 (×10): qty 2
  Filled 2013-10-03: qty 1
  Filled 2013-10-03 (×7): qty 2

## 2013-10-03 MED ORDER — IOHEXOL 300 MG/ML  SOLN
100.0000 mL | Freq: Once | INTRAMUSCULAR | Status: AC | PRN
  Administered 2013-10-03: 100 mL via INTRAVENOUS

## 2013-10-03 NOTE — Progress Notes (Signed)
ANTIBIOTIC CONSULT NOTE - FOLLOW UP  Pharmacy Consult for Primaxin Indication: sigmoid diverticulitis with contained perforation  Allergies  Allergen Reactions  . Amoxicillin Rash    Patient Measurements: Height: 5\' 11"  (180.3 cm) Weight: 190 lb (86.183 kg) IBW/kg (Calculated) : 75.3   Vital Signs: Temp: 97.6 F (36.4 C) (07/01 0553) Temp src: Oral (07/01 0553) BP: 144/81 mmHg (07/01 0553) Pulse Rate: 65 (07/01 0553) Intake/Output from previous day: 06/30 0701 - 07/01 0700 In: 3052.5 [P.O.:960; I.V.:1792.5; IV Piggyback:300] Out: 302 [Urine:300; Stool:2] Intake/Output from this shift:    Labs:  Recent Labs  10/02/13 0525 10/03/13 0536  WBC 7.9 8.5  HGB 11.1* 12.6*  PLT 236 275  CREATININE 0.91 0.95   Estimated Creatinine Clearance: 99.1 ml/min (by C-G formula based on Cr of 0.95). No results found for this basename: VANCOTROUGH, VANCOPEAK, VANCORANDOM, GENTTROUGH, GENTPEAK, GENTRANDOM, TOBRATROUGH, TOBRAPEAK, TOBRARND, AMIKACINPEAK, AMIKACINTROU, AMIKACIN,  in the last 72 hours   Microbiology: No results found for this or any previous visit (from the past 720 hour(s)).  Anti-infectives   Start     Dose/Rate Route Frequency Ordered Stop   09/30/13 0600  metroNIDAZOLE (FLAGYL) IVPB 500 mg  Status:  Discontinued     500 mg 100 mL/hr over 60 Minutes Intravenous Every 8 hours 09/30/13 0026 09/30/13 1247   09/30/13 0600  imipenem-cilastatin (PRIMAXIN) 500 mg in sodium chloride 0.9 % 100 mL IVPB     500 mg 200 mL/hr over 30 Minutes Intravenous 4 times per day 09/30/13 0136     09/29/13 2200  imipenem-cilastatin (PRIMAXIN) 500 mg in sodium chloride 0.9 % 100 mL IVPB     500 mg 200 mL/hr over 30 Minutes Intravenous  Once 09/29/13 2157 09/29/13 2353   09/29/13 2200  metroNIDAZOLE (FLAGYL) IVPB 500 mg     500 mg 100 mL/hr over 60 Minutes Intravenous  Once 09/29/13 2157 09/29/13 2320      Assessment: 52 yoM admitted with sigmoid diverticulitis with contained  perforation for bowel rest and IV antibiotics.  Pharmacy consulted to dose Primaxin.  Per [facility], patient is clinically making progress on antibiotics and no intervention is required.  6/27 >> Flagyl >> 6/28 6/28 >> Primaxin >>  Tmax: Afebrile WBCs: improved to wnl Renal: SCr improving, CrCl 93 ml/min (normalized) No cultures.  7/1: D4 Primaxin 500mg  q6h.  Renal function stable.  No dose adjustments needed.  Goal of Therapy:  Eradication of infection  Plan:  Continue Primaxin 500 mg IV q6h.  Clance Bollunyon, Anah Billard 10/03/2013,10:28 AM

## 2013-10-03 NOTE — Consult Note (Signed)
WOC ostomy consult note  Reason for Consult:Preoperative stoma site selection per Dr. Ardine EngGerkin's request.  Abdomen is distended and patient is very uncomfortable.  Daughter and friend with patient.  He does not want an ostomy, understands that it may be a possibility if surgery is required.  Knows the role of the WOC nurse. Sire is marked after assessing patient in the standing and sitting positions.  He is too uncomfortable to lie down in the bed.  Patient's underwear line is very low on the abdomen so site selected will have to be above the waist line. Site(s) selected are both at midline:  Right is located 7.5cm to the right of the umbilicus and left is located 6.5cm to the left of the umbilicus.  Both are marked with surgical skin marking pen and covered with thin film dressings.  I will stand by in the event a stroma is created. WOC nursing team will not follow, but will remain available to this patient, the nursing and medical team.  Please re-consult if needed. Thanks, Ladona MowLaurie Sidney Silberman, MSN, RN, GNP, [place]emsWOCN, CWON-AP [phone])

## 2013-10-03 NOTE — Progress Notes (Signed)
General Surgery [facility]  Patient seen and examined.  Developed increased pain and distension last night, requested additional narcotics from on call physician (Dr. Ezzard StandingNewman).  Today with persistent distension, little flatus, little liquid stool.  Ambulated in halls.  Abdomen markedly distended, minimally tender to palpation.  BS present.  AXR 2 view and decubitus with dilated small bowel, gas and stool in colon, no free air seen.  Await CT scan of abdomen with oral contrast - patient taking po contrast now.  Will follow closely.  Velora Hecklerodd M. Andilyn Bettcher, MD, [facility] Office: [phone]

## 2013-10-03 NOTE — Progress Notes (Signed)
Patient had an extremely large bowel movement.  Philomena Dohenyavid Aimie Wagman RN

## 2013-10-03 NOTE — Progress Notes (Signed)
  Subjective: He says he got more distended yesterday and got more pain last PM.  He is much more distended than he was yesterday.  He says pain is on the right side.  Objective: Vital signs in last 24 hours: Temp:  [97.6 F (36.4 C)-98.5 F (36.9 C)] 97.6 F (36.4 C) (07/01 0553) Pulse Rate:  [65-71] 65 (07/01 0553) Resp:  [18] 18 (07/01 0553) BP: (144-159)/(80-88) 144/81 mmHg (07/01 0553) SpO2:  [96 %-97 %] 96 % (07/01 0553) Last BM Date: 09/29/13 960 PO recorded.  Made NPO last PM 2 stools recorded Afebrile, VSS BP up moderately Labs OK, WBC is 8.5 Dilaudid:  4 mg yesterday up till MN, 12 mg since that time; he also took 30 mg hydrocodone during the day yesterday. Intake/Output from previous day: 06/30 0701 - 07/01 0700 In: 3052.5 [P.O.:960; I.V.:1792.5; IV Piggyback:300] Out: 302 [Urine:300; Stool:2] Intake/Output this shift:    General appearance: alert, cooperative, no distress and more distended and much more uncomfortable than yesterday. Resp: clear to auscultation bilaterally GI: Very distended this AM, sore and tender on the right side.  OK with palpation on the left side. hypoactive bowel sounds.  Lab Results:   Recent Labs  10/02/13 0525 10/03/13 0536  WBC 7.9 8.5  HGB 11.1* 12.6*  HCT 32.8* 35.1*  PLT 236 275    BMET  Recent Labs  10/02/13 0525 10/03/13 0536  NA 136* 136*  K 3.9 3.9  CL 104 101  CO2 21 21  GLUCOSE 102* 106*  BUN 9 9  CREATININE 0.91 0.95  CALCIUM 8.6 8.9   PT/INR No results found for this basename: LABPROT, INR,  in the last 72 hours   Recent Labs Lab 09/29/13 1848  AST 19  ALT 30  ALKPHOS 72  BILITOT 1.2  PROT 7.8  ALBUMIN 3.7     Lipase     Component Value Date/Time   LIPASE 12 09/29/2013 1848     Studies/Results: No results found.  Medications: . feeding supplement (RESOURCE BREEZE)  1 Container Oral BID BM  . heparin  5,000 Units Subcutaneous 3 times per day  . imipenem-cilastatin  500 mg  Intravenous 4 times per day  . pantoprazole (PROTONIX) IV  40 mg Intravenous QHS    Assessment/Plan Recurrent Sigmoid diverticulitis with contained perforation  New SB dilatation and possible pneumoperitoneum. Ileitis  Pain control   Plan:  Stat lateral decubitus, and if there is any doubt we will get a repeat CT today.  I have made him NPO and I will get them to mark him for a possible ostomy.   LOS: 4 days    Tonea Leiphart 10/03/2013

## 2013-10-04 LAB — COMPREHENSIVE METABOLIC PANEL
ALBUMIN: 3.3 g/dL — AB (ref 3.5–5.2)
ALK PHOS: 77 U/L (ref 39–117)
ALT: 25 U/L (ref 0–53)
AST: 36 U/L (ref 0–37)
Anion gap: 13 (ref 5–15)
BUN: 8 mg/dL (ref 6–23)
CHLORIDE: 101 meq/L (ref 96–112)
CO2: 24 mEq/L (ref 19–32)
Calcium: 9.1 mg/dL (ref 8.4–10.5)
Creatinine, Ser: 0.98 mg/dL (ref 0.50–1.35)
GFR calc Af Amer: >90 mL/min (ref >90)
GFR calc non Af Amer: >90 mL/min (ref >90)
Glucose, Bld: 99 mg/dL (ref 70–99)
POTASSIUM: 3.7 meq/L (ref 3.7–5.3)
SODIUM: 138 meq/L (ref 137–147)
TOTAL PROTEIN: 7.6 g/dL (ref 6.0–8.3)
Total Bilirubin: 1.2 mg/dL (ref 0.3–1.2)

## 2013-10-04 LAB — CBC
HCT: 37.6 % — ABNORMAL LOW (ref 39.0–52.0)
Hemoglobin: 12.9 g/dL — ABNORMAL LOW (ref 13.0–17.0)
MCH: 30.9 pg (ref 26.0–34.0)
MCHC: 34.3 g/dL (ref 30.0–36.0)
MCV: 90.2 fL (ref 78.0–100.0)
Platelets: 307 10*3/uL (ref 150–400)
RBC: 4.17 MIL/uL — ABNORMAL LOW (ref 4.22–5.81)
RDW: 13.6 % (ref 11.5–15.5)
WBC: 7.4 10*3/uL (ref 4.0–10.5)

## 2013-10-04 LAB — PROTIME-INR
INR: 1.04 (ref 0.00–1.49)
Prothrombin Time: 13.6 seconds (ref 11.6–15.2)

## 2013-10-04 LAB — APTT: APTT: 40 s — AB (ref 24–37)

## 2013-10-04 NOTE — Progress Notes (Signed)
Subjective: He is not complaining of as much pain but he has had a fair amount of narcotic.  His abdomen is still distended, and tight.  Having loose stools.  Objective: Vital signs in last 24 hours: Temp:  [98.3 F (36.8 C)-98.8 F (37.1 C)] 98.3 F (36.8 C) (07/02 0554) Pulse Rate:  [66-74] 74 (07/02 0554) Resp:  [18] 18 (07/01 2138) BP: (150-173)/(82-92) 150/83 mmHg (07/02 0644) SpO2:  [92 %-98 %] 96 % (07/02 0554) Last BM Date: 09/29/13 17 mg dilaudid yesterday NPO 4 stools recorded no urine output Afebrile, VSS BP up some Labs OK WBC 7.4 CT scan shows:  Sigmoid diverticulitis with localized perforation and tiny scattered foci of free air, progressed from the prior study. 6.9 cm developing pelvic abscess, increased.   Intake/Output from previous day: 07/01 0701 - 07/02 0700 In: 2615.4 [I.V.:2415.4; IV Piggyback:200] Out: -  Intake/Output this shift: Total I/O In: 2615.4 [I.V.:2415.4; IV Piggyback:200] Out: -   General appearance: alert, cooperative and no distress GI: sitting up, complaining of less pain, abdomen still very distended, less pain on palpation.  Lab Results:   Recent Labs  10/03/13 0536 10/04/13 0525  WBC 8.5 7.4  HGB 12.6* 12.9*  HCT 35.1* 37.6*  PLT 275 307    BMET  Recent Labs  10/03/13 0536 10/04/13 0525  NA 136* 138  K 3.9 3.7  CL 101 101  CO2 21 24  GLUCOSE 106* 99  BUN 9 8  CREATININE 0.95 0.98  CALCIUM 8.9 9.1   PT/INR No results found for this basename: LABPROT, INR,  in the last 72 hours   Recent Labs Lab 09/29/13 1848 10/04/13 0525  AST 19 36  ALT 30 25  ALKPHOS 72 77  BILITOT 1.2 1.2  PROT 7.8 7.6  ALBUMIN 3.7 3.3*     Lipase     Component Value Date/Time   LIPASE 12 09/29/2013 1848     Studies/Results: Dg Abd 1 View  10/03/2013   CLINICAL DATA:  Possible pneumoperitoneum.  EXAM: ABDOMEN - 1 VIEW  COMPARISON:  Radiographs of same day.  FINDINGS: Left lateral decubitus view of the abdomen does not  demonstrate definite evidence of pneumoperitoneum. Continued small bowel dilatation is noted concerning for distal small bowel obstruction.  IMPRESSION: No definite evidence of pneumoperitoneum is noted on left lateral decubitus view.   Electronically Signed   By: Roque LiasJames  Green M.D.   On: 10/03/2013 12:13   Ct Abdomen Pelvis W Contrast  10/03/2013   CLINICAL DATA:  Diverticulitis, continued abdominal pain, increasing abdominal distension  EXAM: CT ABDOMEN AND PELVIS WITH CONTRAST  TECHNIQUE: Multidetector CT imaging of the abdomen and pelvis was performed using the standard protocol following bolus administration of intravenous contrast.  CONTRAST:  100mL OMNIPAQUE IOHEXOL 300 MG/ML  SOLN  COMPARISON:  09/29/2013  FINDINGS: Small bilateral pleural effusions, progressed. Associated lower lobe opacities, likely compressive atelectasis.  2.2 cm possible hemangioma in the posterior segment right hepatic lobe (series 2/ image 21), although incompletely .  Spleen, pancreas, and adrenal glands are within normal limits.  Gallbladder is grossly unremarkable. No intrahepatic or extrahepatic ductal dilatation.  Kidneys are within normal limits.  No hydronephrosis.  Multiple mildly prominent loops of small and large bowel in the central abdomen, likely reflecting adynamic ileus.  Sigmoid diverticulitis with adjacent 2.0 x 2.6 cm thin-walled gas collection along the sigmoid mesocolon (series 2/ image 55), reflecting localized perforation. Adjacent scattered foci of mesenteric gas, some of which are difficult to separate from  adjacent loops of decompressed bowel, but appear irregular. Additional scattered tiny foci of nondependent free gas, new/progressed.  Associated rim enhancing 5.5 x 6.9 cm fluid collection in the pelvis (series 2/ image 69), reflecting a developing abscess.  Atherosclerotic calcifications of the abdominal aorta and branch vessels.  No suspicious abdominopelvic lymphadenopathy.  Prostate is unremarkable.   Bladder is mildly thick-walled but underdistended.  Visualized osseous structures are within normal limits.  IMPRESSION: Sigmoid diverticulitis with localized perforation and tiny scattered foci of free air, progressed from the prior study.  6.9 cm developing pelvic abscess, increased.  These results were called by telephone at the time of interpretation on 10/03/2013 at 9:43 PM to Roby LoftsShannon Young, the nurse caring for the patient, who verbally acknowledged these results.   Electronically Signed   By: Charline BillsSriyesh  Krishnan M.D.   On: 10/03/2013 21:43   Dg Abd 2 Views  10/03/2013   CLINICAL DATA:  Abdominal pain and distension.  EXAM: ABDOMEN - 2 VIEW  COMPARISON:  CT scan of September 29, 2013; radiograph of July 04, 2006.  FINDINGS: Dilated small bowel loops are noted with air-fluid levels consistent with distal small bowel obstruction. No colonic dilatation is noted. Residual contrast is noted within the colon. Possible Rigler's sign is seen the left upper quadrant, which which suggest that the contained perforation described on prior CT scan has progressed to larger pneumoperitoneum. Left lateral decubitus view of the abdomen may be performed for further evaluation.  IMPRESSION: Dilated small bowel loops are noted consistent with distal small bowel obstruction. Possible pneumoperitoneum is noted ; left lateral decubitus view of the abdomen is recommended for further evaluation. Critical Value/emergent results were called by telephone at the time of interpretation on 10/03/2013 at 10:27 AM to Dr. Sherrie GeorgeWILLARD Derelle Cockrell , who verbally acknowledged these results.   Electronically Signed   By: Roque LiasJames  Green M.D.   On: 10/03/2013 10:27    Medications: . feeding supplement (RESOURCE BREEZE)  1 Container Oral BID BM  . heparin  5,000 Units Subcutaneous 3 times per day  . imipenem-cilastatin  500 mg Intravenous 4 times per day  . pantoprazole (PROTONIX) IV  40 mg Intravenous QHS    Assessment/Plan Recurrent Sigmoid  diverticulitis with contained perforation  New SB dilatation and possible pneumoperitoneum. CT scan show: rim enhancing 5.5 x 6.9 cm fluid collection in the pelvis (series 2/ image 69), reflecting a developing abscess. Ileitis  Pain control   Plan:  IR to see and evaluate for drainage of abscess.  Continuing antibiotics.  Discussed possible need for surgery if IR is unable to drain.    LOS: 5 days    Randy Park 10/04/2013

## 2013-10-04 NOTE — Progress Notes (Signed)
Patient ID: Randy Park, male   DOB: 1961-02-11, 53 y.o.   MRN: [id number] Request received from [facility] for CT guided drainage of diverticular/pelvic abscess on pt. This is a 53 y.o. BM with PMH significant for GERD, diverticulosis with recurrent diverticulitis, presenting to the ED for abdominal pain. Over the past month, patient has had 3 episodes of diverticulitis with LLQ pain and diarrhea. He states he was initially seen in the emergency department, had CT scan performed, and was treated with a course of Cipro and Flagyl. He followed up with GI, Dr. Russella Dar, and seemed to be improving but was scheduled for colonoscopy for further evaluation. He states he was recently seen and admitted at [facility] in [place]. He had a repeat CT scan 6/26 which again confirmed diverticulitis with associated microperforation,. There was no visualized abscess on CT and he was again treated with cipro and flagyl and discharged . Patient states today pain has intensified but is more localized to his right lower abdomen. He has had multiple episodes of vomiting, some with streaks of bright red blood, which he did not have with previous episodes of diverticulitis.  No prior hx of kidney stones. No fever or chills. CT on 7/1 revealed sigmoid diverticulitis with localized perforation and tiny scattered  foci of free air, progressed from the prior study with  6.9 cm developing pelvic abscess. Images were reviewed by Dr. Deanne Coffer. Additional hx as below. Exam: pt awake/alert; chest- sl dim BS bases; heart- RRR; abd- dist, few BS, tender primarily in RLQ; ext-FROM, no edema.   Filed Vitals:   10/03/13 1333 10/03/13 2138 10/04/13 0554 10/04/13 0644  BP: 155/82 162/92 173/88 150/83  Pulse: 74 66 74   Temp: 98.8 F (37.1 C) 98.3 F (36.8 C) 98.3 F (36.8 C)   TempSrc: Oral Oral Oral   Resp: 18 18    Height:      Weight:      SpO2: 98% 92% 96%    Past Medical History  Diagnosis Date  . GERD  (gastroesophageal reflux disease)   . Diverticulosis   . Hiatal hernia    Past Surgical History  Procedure Laterality Date  . Head surgery     Dg Abd 1 View  10/03/2013   CLINICAL DATA:  Possible pneumoperitoneum.  EXAM: ABDOMEN - 1 VIEW  COMPARISON:  Radiographs of same day.  FINDINGS: Left lateral decubitus view of the abdomen does not demonstrate definite evidence of pneumoperitoneum. Continued small bowel dilatation is noted concerning for distal small bowel obstruction.  IMPRESSION: No definite evidence of pneumoperitoneum is noted on left lateral decubitus view.   Electronically Signed   By: Roque Lias M.D.   On: 10/03/2013 12:13   Ct Abdomen Pelvis W Contrast  10/03/2013   CLINICAL DATA:  Diverticulitis, continued abdominal pain, increasing abdominal distension  EXAM: CT ABDOMEN AND PELVIS WITH CONTRAST  TECHNIQUE: Multidetector CT imaging of the abdomen and pelvis was performed using the standard protocol following bolus administration of intravenous contrast.  CONTRAST:  OMNIPAQUE IOHEXOL 300 MG/ML  SOLN  COMPARISON:  09/29/2013  FINDINGS: Small bilateral pleural effusions, progressed. Associated lower lobe opacities, likely compressive atelectasis.  2.2 cm possible hemangioma in the posterior segment right hepatic lobe (series 2/ image 21), although incompletely .  Spleen, pancreas, and adrenal glands are within normal limits.  Gallbladder is grossly unremarkable. No intrahepatic or extrahepatic ductal dilatation.  Kidneys are within normal limits.  No hydronephrosis.  Multiple mildly prominent loops of small  and large bowel in the central abdomen, likely reflecting adynamic ileus.  Sigmoid diverticulitis with adjacent 2.0 x 2.6 cm thin-walled gas collection along the sigmoid mesocolon (series 2/ image 55), reflecting localized perforation. Adjacent scattered foci of mesenteric gas, some of which are difficult to separate from adjacent loops of decompressed bowel, but appear irregular.  Additional scattered tiny foci of nondependent free gas, new/progressed.  Associated rim enhancing 5.5 x 6.9 cm fluid collection in the pelvis (series 2/ image 69), reflecting a developing abscess.  Atherosclerotic calcifications of the abdominal aorta and branch vessels.  No suspicious abdominopelvic lymphadenopathy.  Prostate is unremarkable.  Bladder is mildly thick-walled but underdistended.  Visualized osseous structures are within normal limits.  IMPRESSION: Sigmoid diverticulitis with localized perforation and tiny scattered foci of free air, progressed from the prior study.  6.9 cm developing pelvic abscess, increased.  These results were called by telephone at the time of interpretation on 10/03/2013 at 9:43 PM to Roby LoftsShannon Young, the nurse caring for the patient, who verbally acknowledged these results.   Electronically Signed   By: Charline BillsSriyesh  Krishnan M.D.   On: 10/03/2013 21:43   Ct Abdomen Pelvis W Contrast  09/29/2013   CLINICAL DATA:  Right lower quadrant and left lower quadrant pain. History of diverticulitis.  EXAM: CT ABDOMEN AND PELVIS WITH CONTRAST  TECHNIQUE: Multidetector CT imaging of the abdomen and pelvis was performed using the standard protocol following bolus administration of intravenous contrast.  CONTRAST:  100mL OMNIPAQUE IOHEXOL 300 MG/ML  SOLN  COMPARISON:  09/06/2013  FINDINGS: BODY WALL: Unremarkable.  LOWER CHEST: Dependent atelectasis bilaterally.  ABDOMEN/PELVIS:  Liver: 18 mm hypervascular mass in the subcapsular segment 6. Better seen on the previous study, there is complete fill-in on the delayed phase which matches blood pool, most consistent with hemangioma.  Biliary: High-density material layering within the gallbladder (Recent abdominal CT per report). There is admixed calcified gallstone based on the previous study.  Pancreas: Unremarkable.  Spleen: Unremarkable.  Adrenals: Unremarkable.  Kidneys and ureters: No hydronephrosis or stone.  Bladder: Unremarkable.   Reproductive: Unremarkable.  Bowel: There is persistent circumferential thickening of the proximal sigmoid colon. The bowel wall thickening has a pattern suggesting submucosal edema rather than enhancing mass. As before, the appearance favors diverticulitis. There is extraluminal gas medial to the affected colonic segment, with the largest pocket measuring 2 cm in maximal diameter. Distal ileum is in close proximity to the contained perforation, and shows circumferential thickening from submucosal edema. At this level, there is transition from dilated small bowel to decompressed bowel. Contrast does traverse the segment, consistent with partial obstruction. Loops are angulated in this region, suggesting inflammatory tethering. There is small volume perihepatic and pelvic ascites without mature fluid-filled abscess. No free pneumoperitoneum. Normal appendix.  Retroperitoneum: No mass or adenopathy.  Vascular: No acute abnormality.  OSSEOUS: No acute abnormalities.  These results were called by telephone at the time of interpretation on 09/29/2013 at 9:29 PM to Dr. Sharilyn SitesLISA SANDERS , who verbally acknowledged these results.  IMPRESSION: 1. Sigmoid diverticulitis which has progressed to contained perforation. The largest pocket of extraluminal gas is 2 cm in diameter. 2. Reactive ileitis causes high-grade small bowel obstruction. 3. 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 Recommend six-month follow-up CT or MRI, sooner if underlying colonic pathology noted on scheduled colonoscopy. 4. Cholelithiasis.   Electronically Signed   By: Audry RilesJonathan  Watts M.D.  On: 09/29/2013 21:31   Ct Abdomen Pelvis W Contrast  09/06/2013   CLINICAL DATA:  Left lower quadrant pain  EXAM: CT ABDOMEN AND PELVIS WITH CONTRAST  TECHNIQUE: Multidetector CT imaging of the abdomen and pelvis was performed using the standard protocol following  bolus administration of intravenous contrast.  CONTRAST:  50mL OMNIPAQUE IOHEXOL 300 MG/ML SOLN, 100mL OMNIPAQUE IOHEXOL 300 MG/ML SOLN  COMPARISON:  None.  FINDINGS: The lung bases are clear.  The liver demonstrates no focal abnormality. There is no intrahepatic or extrahepatic biliary ductal dilatation. The gallbladder is normal. The spleen demonstrates no focal abnormality. The kidneys, adrenal glands and pancreas are normal. The bladder is unremarkable.  The stomach, duodenum, small intestine, and large intestine demonstrate no contrast extravasation or dilatation. There is diverticulosis of the descending colon and sigmoid colon. At the junction of the descending colon and sigmoid colon there is bowel wall thickening, pericolonic inflammatory changes most concerning for acute diverticulitis. There is no peridiverticular fluid collection. There is a normal caliber appendix in the right lower quadrant without periappendiceal inflammatory changes. There is no pneumoperitoneum, pneumatosis, or portal venous gas. There is no abdominal or pelvic free fluid. There is no lymphadenopathy.  The abdominal aorta is normal in caliber .  There are no lytic or sclerotic osseous lesions. 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  IMPRESSION: Acute diverticulitis at the junction of the descending colon and sigmoid colon. No adjacent focal fluid collection to suggest an abscess. Recommend follow-up colonoscopy following the resolution of the patient's acute illness to exclude any underlying pathology.   Electronically Signed   By: Elige KoHetal  Patel   On: 09/06/2013 14:45   Dg Abd 2 Views  10/03/2013   CLINICAL DATA:  Abdominal pain and distension.  EXAM: ABDOMEN - 2 VIEW  COMPARISON:  CT scan of September 29, 2013; radiograph of July 04, 2006.  FINDINGS: Dilated small bowel loops are noted with air-fluid levels consistent with distal small bowel obstruction. No colonic dilatation is noted. Residual contrast is noted within the colon.  Possible Rigler's sign is seen the left upper quadrant, which which suggest that the contained perforation described on prior CT scan has progressed to larger pneumoperitoneum. Left lateral decubitus view of the abdomen may be performed for further evaluation.  IMPRESSION: Dilated small bowel loops are noted consistent with distal small bowel obstruction. Possible pneumoperitoneum is noted ; left lateral decubitus view of the abdomen is recommended for further evaluation. Critical Value/emergent results were called by telephone at the time of interpretation on 10/03/2013 at 10:27 AM to Dr. Sherrie GeorgeWILLARD JENNINGS , who verbally acknowledged these results.   Electronically Signed   By: Roque LiasJames  Green M.D.   On: 10/03/2013 10:27   Results for orders placed during the hospital encounter of 09/29/13  CBC WITH DIFFERENTIAL      Result Value Ref Range   WBC 18.7 (*) 4.0 - 10.5 K/uL   RBC 4.37  4.22 - 5.81 MIL/uL   Hemoglobin 13.8  13.0 - 17.0 g/dL   HCT 16.140.2  09.639.0 - 04.552.0 %   MCV 92.0  78.0 - 100.0 fL   MCH 31.6  26.0 - 34.0 pg   MCHC 34.3  30.0 - 36.0 g/dL   RDW 40.914.1  81.111.5 - 91.415.5 %   Platelets 263  150 - 400 K/uL   Neutrophils Relative % 90 (*) 43 - 77 %   Neutro Abs 16.9 (*) 1.7 - 7.7 K/uL   Lymphocytes Relative 5 (*) 12 - 46 %  Lymphs Abs 0.9  0.7 - 4.0 K/uL   Monocytes Relative 5  3 - 12 %   Monocytes Absolute 0.9  0.1 - 1.0 K/uL   Eosinophils Relative 0  0 - 5 %   Eosinophils Absolute 0.0  0.0 - 0.7 K/uL   Basophils Relative 0  0 - 1 %   Basophils Absolute 0.0  0.0 - 0.1 K/uL  COMPREHENSIVE METABOLIC PANEL      Result Value Ref Range   Sodium 136 (*) 137 - 147 mEq/L   Potassium 4.3  3.7 - 5.3 mEq/L   Chloride 99  96 - 112 mEq/L   CO2 23  19 - 32 mEq/L   Glucose, Bld 118 (*) 70 - 99 mg/dL   BUN 20  6 - 23 mg/dL   Creatinine, Ser 4.09  0.50 - 1.35 mg/dL   Calcium 9.4  8.4 - 81.1 mg/dL   Total Protein 7.8  6.0 - 8.3 g/dL   Albumin 3.7  3.5 - 5.2 g/dL   AST 19  0 - 37 U/L   ALT 30  0 - 53 U/L    Alkaline Phosphatase 72  39 - 117 U/L   Total Bilirubin 1.2  0.3 - 1.2 mg/dL   GFR calc non Af Amer 68 (*) >90 mL/min   GFR calc Af Amer 79 (*) >90 mL/min  LIPASE, BLOOD      Result Value Ref Range   Lipase 12  11 - 59 U/L  URINALYSIS, ROUTINE W REFLEX MICROSCOPIC      Result Value Ref Range   Color, Urine AMBER (*) YELLOW   APPearance CLEAR  CLEAR   Specific Gravity, Urine 1.029  1.005 - 1.030   pH 5.0  5.0 - 8.0   Glucose, UA NEGATIVE  NEGATIVE mg/dL   Hgb urine dipstick MODERATE (*) NEGATIVE   Bilirubin Urine NEGATIVE  NEGATIVE   Ketones, ur NEGATIVE  NEGATIVE mg/dL   Protein, ur 30 (*) NEGATIVE mg/dL   Urobilinogen, UA 0.2  0.0 - 1.0 mg/dL   Nitrite NEGATIVE  NEGATIVE   Leukocytes, UA SMALL (*) NEGATIVE  URINE MICROSCOPIC-ADD ON      Result Value Ref Range   RBC / HPF 0-2  <3 RBC/hpf   Urine-Other MUCOUS PRESENT    BASIC METABOLIC PANEL      Result Value Ref Range   Sodium 138  137 - 147 mEq/L   Potassium 4.4  3.7 - 5.3 mEq/L   Chloride 104  96 - 112 mEq/L   CO2 23  19 - 32 mEq/L   Glucose, Bld 112 (*) 70 - 99 mg/dL   BUN 18  6 - 23 mg/dL   Creatinine, Ser 9.14  0.50 - 1.35 mg/dL   Calcium 8.5  8.4 - 78.2 mg/dL   GFR calc non Af Amer 69 (*) >90 mL/min   GFR calc Af Amer 80 (*) >90 mL/min  CBC      Result Value Ref Range   WBC 13.6 (*) 4.0 - 10.5 K/uL   RBC 4.08 (*) 4.22 - 5.81 MIL/uL   Hemoglobin 12.8 (*) 13.0 - 17.0 g/dL   HCT 95.6 (*) 21.3 - 08.6 %   MCV 92.4  78.0 - 100.0 fL   MCH 31.4  26.0 - 34.0 pg   MCHC 34.0  30.0 - 36.0 g/dL   RDW 57.8  46.9 - 62.9 %   Platelets 235  150 - 400 K/uL  CBC      Result  Value Ref Range   WBC 7.9  4.0 - 10.5 K/uL   RBC 3.56 (*) 4.22 - 5.81 MIL/uL   Hemoglobin 11.1 (*) 13.0 - 17.0 g/dL   HCT 16.1 (*) 09.6 - 04.5 %   MCV 92.1  78.0 - 100.0 fL   MCH 31.2  26.0 - 34.0 pg   MCHC 33.8  30.0 - 36.0 g/dL   RDW 40.9  81.1 - 91.4 %   Platelets 236  150 - 400 K/uL  BASIC METABOLIC PANEL      Result Value Ref Range   Sodium 136  (*) 137 - 147 mEq/L   Potassium 3.9  3.7 - 5.3 mEq/L   Chloride 104  96 - 112 mEq/L   CO2 21  19 - 32 mEq/L   Glucose, Bld 102 (*) 70 - 99 mg/dL   BUN 9  6 - 23 mg/dL   Creatinine, Ser 7.82  0.50 - 1.35 mg/dL   Calcium 8.6  8.4 - 95.6 mg/dL   GFR calc non Af Amer >90  >90 mL/min   GFR calc Af Amer >90  >90 mL/min  BASIC METABOLIC PANEL      Result Value Ref Range   Sodium 136 (*) 137 - 147 mEq/L   Potassium 3.9  3.7 - 5.3 mEq/L   Chloride 101  96 - 112 mEq/L   CO2 21  19 - 32 mEq/L   Glucose, Bld 106 (*) 70 - 99 mg/dL   BUN 9  6 - 23 mg/dL   Creatinine, Ser 2.13  0.50 - 1.35 mg/dL   Calcium 8.9  8.4 - 08.6 mg/dL   GFR calc non Af Amer >90  >90 mL/min   GFR calc Af Amer >90  >90 mL/min  CBC      Result Value Ref Range   WBC 8.5  4.0 - 10.5 K/uL   RBC 3.85 (*) 4.22 - 5.81 MIL/uL   Hemoglobin 12.6 (*) 13.0 - 17.0 g/dL   HCT 57.8 (*) 46.9 - 62.9 %   MCV 91.2  78.0 - 100.0 fL   MCH 32.7  26.0 - 34.0 pg   MCHC 35.9  30.0 - 36.0 g/dL   RDW 52.8  41.3 - 24.4 %   Platelets 275  150 - 400 K/uL  CBC      Result Value Ref Range   WBC 7.4  4.0 - 10.5 K/uL   RBC 4.17 (*) 4.22 - 5.81 MIL/uL   Hemoglobin 12.9 (*) 13.0 - 17.0 g/dL   HCT 01.0 (*) 27.2 - 53.6 %   MCV 90.2  78.0 - 100.0 fL   MCH 30.9  26.0 - 34.0 pg   MCHC 34.3  30.0 - 36.0 g/dL   RDW 64.4  03.4 - 74.2 %   Platelets 307  150 - 400 K/uL  COMPREHENSIVE METABOLIC PANEL      Result Value Ref Range   Sodium 138  137 - 147 mEq/L   Potassium 3.7  3.7 - 5.3 mEq/L   Chloride 101  96 - 112 mEq/L   CO2 24  19 - 32 mEq/L   Glucose, Bld 99  70 - 99 mg/dL   BUN 8  6 - 23 mg/dL   Creatinine, Ser 5.95  0.50 - 1.35 mg/dL   Calcium 9.1  8.4 - 63.8 mg/dL   Total Protein 7.6  6.0 - 8.3 g/dL   Albumin 3.3 (*) 3.5 - 5.2 g/dL   AST 36  0 - 37 U/L  ALT 25  0 - 53 U/L   Alkaline Phosphatase 77  39 - 117 U/L   Total Bilirubin 1.2  0.3 - 1.2 mg/dL   GFR calc non Af Amer >90  >90 mL/min   GFR calc Af Amer >90  >90 mL/min   Anion gap  13  5 - 15  PROTIME-INR      Result Value Ref Range   Prothrombin Time 13.6  11.6 - 15.2 seconds   INR 1.04  0.00 - 1.49  APTT      Result Value Ref Range   aPTT 40 (*) 24 - 37 seconds   A/P: Pt with recurrent diverticulitis and localized perforation/developing pelvic abscess. Imaging studies were reviewed by Dr. Deanne Coffer and abscess is amenable to drainage via TG approach. Since pt received SQ heparin dose today will plan procedure for 7/3. Details/risks of procedure d/w pt with his understanding and consent.

## 2013-10-04 NOTE — Progress Notes (Signed)
Dr. Daphine DeutscherMartin called and notified of critical CT abd results of areas of free air and abscess formation given by Dr. Rito EhrlichKrishnan. Pt remains stable at this time with no additional complaints. No further orders received at this time.

## 2013-10-04 NOTE — Progress Notes (Signed)
General Surgery [facility]  Discussed with IR - plan trans-gluteal percutaneous drainage of abscess tomorrow (patient had already received prophylactic anticoagulation today).  CT scan from last PM reviewed.  No significant pneumoperitoneum.  Small air noted consistent with perforated diverticulitis.  Abscess in pelvis to be drained.  WBC normal.  Afebrile.  VSS.  Velora Hecklerodd M. Delcia Spitzley, MD, [facility] Office: [phone]

## 2013-10-05 ENCOUNTER — Inpatient Hospital Stay (HOSPITAL_COMMUNITY): Payer: BC Managed Care – PPO

## 2013-10-05 ENCOUNTER — Encounter (HOSPITAL_COMMUNITY): Payer: Self-pay | Admitting: Radiology

## 2013-10-05 LAB — CBC
HEMATOCRIT: 35.8 % — AB (ref 39.0–52.0)
Hemoglobin: 12.4 g/dL — ABNORMAL LOW (ref 13.0–17.0)
MCH: 30.8 pg (ref 26.0–34.0)
MCHC: 34.6 g/dL (ref 30.0–36.0)
MCV: 89.1 fL (ref 78.0–100.0)
PLATELETS: 343 10*3/uL (ref 150–400)
RBC: 4.02 MIL/uL — AB (ref 4.22–5.81)
RDW: 13.4 % (ref 11.5–15.5)
WBC: 8.3 10*3/uL (ref 4.0–10.5)

## 2013-10-05 MED ORDER — FENTANYL CITRATE 0.05 MG/ML IJ SOLN
INTRAMUSCULAR | Status: AC | PRN
  Administered 2013-10-05: 50 ug via INTRAVENOUS
  Administered 2013-10-05: 100 ug via INTRAVENOUS
  Administered 2013-10-05: 50 ug via INTRAVENOUS

## 2013-10-05 MED ORDER — MIDAZOLAM HCL 2 MG/2ML IJ SOLN
INTRAMUSCULAR | Status: AC | PRN
  Administered 2013-10-05: 2 mg via INTRAVENOUS

## 2013-10-05 MED ORDER — FENTANYL CITRATE 0.05 MG/ML IJ SOLN
INTRAMUSCULAR | Status: AC
  Filled 2013-10-05: qty 4

## 2013-10-05 MED ORDER — MIDAZOLAM HCL 2 MG/2ML IJ SOLN
INTRAMUSCULAR | Status: AC
  Filled 2013-10-05: qty 4

## 2013-10-05 NOTE — Progress Notes (Signed)
Subjective: Pt with multiple BM overnight.  [place]  feels cold.   Objective: Vital signs in last 24 hours: Temp:  [98.4 F (36.9 C)-98.7 F (37.1 C)] 98.5 F (36.9 C) (07/03 0546) Pulse Rate:  [67-71] 71 (07/03 0546) Resp:  [16-18] 16 (07/03 0546) BP: (115-152)/(70-84) 136/84 mmHg (07/03 0546) SpO2:  [95 %-97 %] 96 % (07/03 0546) Last BM Date: 10/05/13  Intake/Output from previous day: 07/02 0701 - 07/03 0700 In: 2600 [I.V.:2400; IV Piggyback:200] Out: -  Intake/Output this shift:    GI: abnormal findings:  distended and mild TTP diffusely.  no peritonitis.   Lab Results:   Recent Labs  10/04/13 0525 10/05/13 0508  WBC 7.4 8.3  HGB 12.9* 12.4*  HCT 37.6* 35.8*  PLT 307 343   BMET  Recent Labs  10/03/13 0536 10/04/13 0525  NA 136* 138  K 3.9 3.7  CL 101 101  CO2 21 24  GLUCOSE 106* 99  BUN 9 8  CREATININE 0.95 0.98  CALCIUM 8.9 9.1   PT/INR  Recent Labs  10/04/13 0839  LABPROT 13.6  INR 1.04   ABG No results found for this basename: PHART, PCO2, PO2, HCO3,  in the last 72 hours  Studies/Results: Dg Abd 1 View  10/03/2013   CLINICAL DATA:  Possible pneumoperitoneum.  EXAM: ABDOMEN - 1 VIEW  COMPARISON:  Radiographs of same day.  FINDINGS: Left lateral decubitus view of the abdomen does not demonstrate definite evidence of pneumoperitoneum. Continued small bowel dilatation is noted concerning for distal small bowel obstruction.  IMPRESSION: No definite evidence of pneumoperitoneum is noted on left lateral decubitus view.   Electronically Signed   By: Roque LiasJames  Green M.D.   On: 10/03/2013 12:13   Ct Abdomen Pelvis W Contrast  10/03/2013   CLINICAL DATA:  Diverticulitis, continued abdominal pain, increasing abdominal distension  EXAM: CT ABDOMEN AND PELVIS WITH CONTRAST  TECHNIQUE: Multidetector CT imaging of the abdomen and pelvis was performed using the standard protocol following bolus administration of intravenous contrast.  CONTRAST:  100mL  OMNIPAQUE IOHEXOL 300 MG/ML  SOLN  COMPARISON:  09/29/2013  FINDINGS: Small bilateral pleural effusions, progressed. Associated lower lobe opacities, likely compressive atelectasis.  2.2 cm possible hemangioma in the posterior segment right hepatic lobe (series 2/ image 21), although incompletely .  Spleen, pancreas, and adrenal glands are within normal limits.  Gallbladder is grossly unremarkable. No intrahepatic or extrahepatic ductal dilatation.  Kidneys are within normal limits.  No hydronephrosis.  Multiple mildly prominent loops of small and large bowel in the central abdomen, likely reflecting adynamic ileus.  Sigmoid diverticulitis with adjacent 2.0 x 2.6 cm thin-walled gas collection along the sigmoid mesocolon (series 2/ image 55), reflecting localized perforation. Adjacent scattered foci of mesenteric gas, some of which are difficult to separate from adjacent loops of decompressed bowel, but appear irregular. Additional scattered tiny foci of nondependent free gas, new/progressed.  Associated rim enhancing 5.5 x 6.9 cm fluid collection in the pelvis (series 2/ image 69), reflecting a developing abscess.  Atherosclerotic calcifications of the abdominal aorta and branch vessels.  No suspicious abdominopelvic lymphadenopathy.  Prostate is unremarkable.  Bladder is mildly thick-walled but underdistended.  Visualized osseous structures are within normal limits.  IMPRESSION: Sigmoid diverticulitis with localized perforation and tiny scattered foci of free air, progressed from the prior study.  6.9 cm developing pelvic abscess, increased.  These results were called by telephone at the time of interpretation on 10/03/2013 at 9:43 PM to Roby LoftsShannon Young, the nurse caring  for the patient, who verbally acknowledged these results.   Electronically Signed   By: Charline BillsSriyesh  Krishnan M.D.   On: 10/03/2013 21:43   Dg Abd 2 Views  10/03/2013   CLINICAL DATA:  Abdominal pain and distension.  EXAM: ABDOMEN - 2 VIEW  COMPARISON:   CT scan of September 29, 2013; radiograph of July 04, 2006.  FINDINGS: Dilated small bowel loops are noted with air-fluid levels consistent with distal small bowel obstruction. No colonic dilatation is noted. Residual contrast is noted within the colon. Possible Rigler's sign is seen the left upper quadrant, which which suggest that the contained perforation described on prior CT scan has progressed to larger pneumoperitoneum. Left lateral decubitus view of the abdomen may be performed for further evaluation.  IMPRESSION: Dilated small bowel loops are noted consistent with distal small bowel obstruction. Possible pneumoperitoneum is noted ; left lateral decubitus view of the abdomen is recommended for further evaluation. Critical Value/emergent results were called by telephone at the time of interpretation on 10/03/2013 at 10:27 AM to Dr. Sherrie GeorgeWILLARD JENNINGS , who verbally acknowledged these results.   Electronically Signed   By: Roque LiasJames  Green M.D.   On: 10/03/2013 10:27    Anti-infectives: Anti-infectives   Start     Dose/Rate Route Frequency Ordered Stop   09/30/13 0600  metroNIDAZOLE (FLAGYL) IVPB 500 mg  Status:  Discontinued     500 mg 100 mL/hr over 60 Minutes Intravenous Every 8 hours 09/30/13 0026 09/30/13 1247   09/30/13 0600  imipenem-cilastatin (PRIMAXIN) 500 mg in sodium chloride 0.9 % 100 mL IVPB     500 mg 200 mL/hr over 30 Minutes Intravenous 4 times per day 09/30/13 0136     09/29/13 2200  imipenem-cilastatin (PRIMAXIN) 500 mg in sodium chloride 0.9 % 100 mL IVPB     500 mg 200 mL/hr over 30 Minutes Intravenous  Once 09/29/13 2157 09/29/13 2353   09/29/13 2200  metroNIDAZOLE (FLAGYL) IVPB 500 mg     500 mg 100 mL/hr over 60 Minutes Intravenous  Once 09/29/13 2157 09/29/13 2320      Assessment/Plan: Patient Active Problem List   Diagnosis Date Noted  . Diverticulitis of colon with perforation 09/29/2013  . COLONIC POLYPS 08/26/2007  . GERD 08/26/2007  . HIATAL HERNIA 08/26/2007  .  COLITIS 08/26/2007  . DIVERTICULOSIS, COLON 08/26/2007  for perc drain today Can eat after Continue ABX    LOS: 6 days    Raevon Broom A. 10/05/2013

## 2013-10-05 NOTE — Procedures (Signed)
CT guided drain placement in pelvic fluid collection from right transgluteal approach.  Removed greater than 30 ml of cloudy yellow fluid.  No immediate complication.

## 2013-10-06 MED ORDER — ENOXAPARIN SODIUM 40 MG/0.4ML ~~LOC~~ SOLN
40.0000 mg | SUBCUTANEOUS | Status: DC
  Administered 2013-10-06 – 2013-10-07 (×2): 40 mg via SUBCUTANEOUS
  Filled 2013-10-06 (×3): qty 0.4

## 2013-10-06 NOTE — Progress Notes (Signed)
  Subjective: Divertic abscess drain placed 7/3 Better today Sore at site  Objective: Vital signs in last 24 hours: Temp:  [97.9 F (36.6 C)-98.6 F (37 C)] 98 F (36.7 C) (07/04 0517) Pulse Rate:  [67-75] 67 (07/04 0517) Resp:  [18-20] 18 (07/04 0517) BP: (137-157)/(78-88) 137/82 mmHg (07/04 0517) SpO2:  [93 %-96 %] 93 % (07/04 0517) Last BM Date: 10/05/13  Intake/Output from previous day: 07/03 0701 - 07/04 0700 In: 300 [IV Piggyback:300] Out: 48 [Drains:48] Intake/Output this shift:    PE:  Afeb; vss Output 50 cc yesterday- serous color 15 cc in JP now Cx no growth so far Site clean and dry Tender; no bleeding  Lab Results:   Recent Labs  10/04/13 0525 10/05/13 0508  WBC 7.4 8.3  HGB 12.9* 12.4*  HCT 37.6* 35.8*  PLT 307 343   BMET  Recent Labs  10/04/13 0525  NA 138  K 3.7  CL 101  CO2 24  GLUCOSE 99  BUN 8  CREATININE 0.98  CALCIUM 9.1   PT/INR  Recent Labs  10/04/13 0839  LABPROT 13.6  INR 1.04   ABG No results found for this basename: PHART, PCO2, PO2, HCO3,  in the last 72 hours  Studies/Results: Ct Image Guided Drainage By Percutaneous Catheter  10/05/2013   Abundio MiuAdam Ryan Henn, MD     10/05/2013 10:11 AM CT guided drain placement in pelvic fluid collection from right  transgluteal approach.  Removed greater than 30 ml of cloudy  yellow fluid.  No immediate complication.     Anti-infectives: Anti-infectives   Start     Dose/Rate Route Frequency Ordered Stop   09/30/13 0600  metroNIDAZOLE (FLAGYL) IVPB 500 mg  Status:  Discontinued     500 mg 100 mL/hr over 60 Minutes Intravenous Every 8 hours 09/30/13 0026 09/30/13 1247   09/30/13 0600  imipenem-cilastatin (PRIMAXIN) 500 mg in sodium chloride 0.9 % 100 mL IVPB     500 mg 200 mL/hr over 30 Minutes Intravenous 4 times per day 09/30/13 0136     09/29/13 2200  imipenem-cilastatin (PRIMAXIN) 500 mg in sodium chloride 0.9 % 100 mL IVPB     500 mg 200 mL/hr over 30 Minutes Intravenous   Once 09/29/13 2157 09/29/13 2353   09/29/13 2200  metroNIDAZOLE (FLAGYL) IVPB 500 mg     500 mg 100 mL/hr over 60 Minutes Intravenous  Once 09/29/13 2157 09/29/13 2320      Assessment/Plan: s/p * No surgery found *  Diverticular abscess drain intact Rt TG drain Output serous color Will follow   LOS: 7 days    Diondre Pulis A 10/06/2013

## 2013-10-06 NOTE — Progress Notes (Signed)
ANTIBIOTIC CONSULT NOTE - FOLLOW UP  Pharmacy Consult for Primaxin Indication: sigmoid diverticulitis with contained perforation  Allergies  Allergen Reactions  . Amoxicillin Rash    Patient Measurements: Height: 5\' 11"  (180.3 cm) Weight: 190 lb (86.183 kg) IBW/kg (Calculated) : 75.3   Vital Signs: Temp: 98 F (36.7 C) (07/04 0517) Temp src: Oral (07/04 0517) BP: 137/82 mmHg (07/04 0517) Pulse Rate: 67 (07/04 0517) Intake/Output from previous day: 07/03 0701 - 07/04 0700 In: 300 [IV Piggyback:300] Out: 48 [Drains:48] Intake/Output from this shift:    Labs:  Recent Labs  10/04/13 0525 10/05/13 0508  WBC 7.4 8.3  HGB 12.9* 12.4*  PLT 307 343  CREATININE 0.98  --    Estimated Creatinine Clearance: 96 ml/min (by C-G formula based on Cr of 0.98). No results found for this basename: VANCOTROUGH, Leodis BinetVANCOPEAK, VANCORANDOM, GENTTROUGH, GENTPEAK, GENTRANDOM, TOBRATROUGH, TOBRAPEAK, TOBRARND, AMIKACINPEAK, AMIKACINTROU, AMIKACIN,  in the last 72 hours   Microbiology: Recent Results (from the past 720 hour(s))  CULTURE, ROUTINE-ABSCESS     Status: None   Collection Time    10/05/13 10:05 AM      Result Value Ref Range Status   Specimen Description PERITONEAL CAVITY   Final   Special Requests NONE   Final   Gram Stain     Final   Value: RARE WBC PRESENT,BOTH PMN AND MONONUCLEAR     NO SQUAMOUS EPITHELIAL CELLS SEEN     NO ORGANISMS SEEN     [facility]   Culture     Final   Value: NO GROWTH 1 DAY     Performed at [facility]   Report Status PENDING   Incomplete  BODY FLUID CULTURE     Status: None   Collection Time    10/05/13 12:00 PM      Result Value Ref Range Status   Specimen Description PELVIS   Final   Special Requests NONE   Final   Gram Stain     Final   Value: WBC PRESENT,BOTH PMN AND MONONUCLEAR     NO ORGANISMS SEEN     Performed at [facility]   Culture PENDING   Incomplete   Report Status PENDING    Incomplete    Anti-infectives   Start     Dose/Rate Route Frequency Ordered Stop   09/30/13 0600  metroNIDAZOLE (FLAGYL) IVPB 500 mg  Status:  Discontinued     500 mg 100 mL/hr over 60 Minutes Intravenous Every 8 hours 09/30/13 0026 09/30/13 1247   09/30/13 0600  imipenem-cilastatin (PRIMAXIN) 500 mg in sodium chloride 0.9 % 100 mL IVPB     500 mg 200 mL/hr over 30 Minutes Intravenous 4 times per day 09/30/13 0136     09/29/13 2200  imipenem-cilastatin (PRIMAXIN) 500 mg in sodium chloride 0.9 % 100 mL IVPB     500 mg 200 mL/hr over 30 Minutes Intravenous  Once 09/29/13 2157 09/29/13 2353   09/29/13 2200  metroNIDAZOLE (FLAGYL) IVPB 500 mg     500 mg 100 mL/hr over 60 Minutes Intravenous  Once 09/29/13 2157 09/29/13 2320      Assessment: 52 yoM admitted with sigmoid diverticulitis with contained perforation for bowel rest and IV antibiotics.  Pharmacy consulted to dose Primaxin.  Per [facility], patient is clinically making progress on antibiotics and no intervention is required.  6/27 >> Flagyl >> 6/28 6/28 >> Primaxin >>  Tmax: Afebrile WBCs: improved to wnl Renal: SCr improving, CrCl 93 ml/min (  normalized) No cultures.  7/4: Day #7 Primaxin 500mg  q6h.  Renal function stable.  No dose adjustments needed.  S/p trans-gluteal percutaneous drainage of abscess 7/3 by IR. Continue IV abx per surgery today and if doing well tomorrow, MD will change to PO.  Goal of Therapy:  Eradication of infection  Plan:  Continue Primaxin 500 mg IV q6h.  Clance Bollunyon, Makeisha Jentsch 10/06/2013,9:47 AM

## 2013-10-06 NOTE — Progress Notes (Signed)
  Subjective: Pain at drain site, hungry, having bms and passing flatus, oob  Objective: Vital signs in last 24 hours: Temp:  [97.9 F (36.6 C)-98.6 F (37 C)] 98 F (36.7 C) (07/04 0517) Pulse Rate:  [67-94] 67 (07/04 0517) Resp:  [14-23] 18 (07/04 0517) BP: (137-158)/(78-97) 137/82 mmHg (07/04 0517) SpO2:  [93 %-100 %] 93 % (07/04 0517) Last BM Date: 10/05/13  Intake/Output from previous day: 07/03 0701 - 07/04 0700 In: 300 [IV Piggyback:300] Out: 48 [Drains:48] Intake/Output this shift:    General appearance: no distress Resp: clear to auscultation bilaterally Cardio: regular rate and rhythm GI: soft, nt/nd bs present, drain with serous fluid minimal  Lab Results:   Recent Labs  10/04/13 0525 10/05/13 0508  WBC 7.4 8.3  HGB 12.9* 12.4*  HCT 37.6* 35.8*  PLT 307 343   BMET  Recent Labs  10/04/13 0525  NA 138  K 3.7  CL 101  CO2 24  GLUCOSE 99  BUN 8  CREATININE 0.98  CALCIUM 9.1   PT/INR  Recent Labs  10/04/13 0839  LABPROT 13.6  INR 1.04   ABG No results found for this basename: PHART, PCO2, PO2, HCO3,  in the last 72 hours  Studies/Results: Ct Image Guided Drainage By Percutaneous Catheter  10/05/2013   Abundio MiuAdam Ryan Henn, MD     10/05/2013 10:11 AM CT guided drain placement in pelvic fluid collection from right  transgluteal approach.  Removed greater than 30 ml of cloudy  yellow fluid.  No immediate complication.     Anti-infectives: Anti-infectives   Start     Dose/Rate Route Frequency Ordered Stop   09/30/13 0600  metroNIDAZOLE (FLAGYL) IVPB 500 mg  Status:  Discontinued     500 mg 100 mL/hr over 60 Minutes Intravenous Every 8 hours 09/30/13 0026 09/30/13 1247   09/30/13 0600  imipenem-cilastatin (PRIMAXIN) 500 mg in sodium chloride 0.9 % 100 mL IVPB     500 mg 200 mL/hr over 30 Minutes Intravenous 4 times per day 09/30/13 0136     09/29/13 2200  imipenem-cilastatin (PRIMAXIN) 500 mg in sodium chloride 0.9 % 100 mL IVPB     500 mg 200  mL/hr over 30 Minutes Intravenous  Once 09/29/13 2157 09/29/13 2353   09/29/13 2200  metroNIDAZOLE (FLAGYL) IVPB 500 mg     500 mg 100 mL/hr over 60 Minutes Intravenous  Once 09/29/13 2157 09/29/13 2320      Assessment/Plan: Complicated diverticulitis s/p drain  1. Po pain meds 2. pulm toilet 3. Advance diet to regular today 4. Continue iv abx, if doing well tomorrow will change to po  5. lovenox scds  Kyleena Scheirer 10/06/2013

## 2013-10-07 ENCOUNTER — Inpatient Hospital Stay (HOSPITAL_COMMUNITY): Payer: BC Managed Care – PPO

## 2013-10-07 NOTE — Progress Notes (Signed)
  Subjective: divertic abscess drain placed 7/3 Output slowing 10 cc yesterday  Objective: Vital signs in last 24 hours: Temp:  [97.8 F (36.6 C)-98.5 F (36.9 C)] 97.8 F (36.6 C) (07/05 0503) Pulse Rate:  [72-84] 72 (07/05 0503) Resp:  [18] 18 (07/05 0503) BP: (143-160)/(81-97) 143/81 mmHg (07/05 0503) SpO2:  [94 %-97 %] 97 % (07/05 0503) Last BM Date: 10/06/13  Intake/Output from previous day: 07/04 0701 - 07/05 0700 In: 705 [I.V.:600; IV Piggyback:100] Out: 10 [Drains:10] Intake/Output this shift: Total I/O In: 925 [I.V.:925] Out: -   PE: afeb; vss Output 10 cc from drain yesterday Minimal in JP now--serous fluid Cx neg so far Abd film +ileus   Lab Results:   Recent Labs  10/05/13 0508  WBC 8.3  HGB 12.4*  HCT 35.8*  PLT 343   BMET No results found for this basename: NA, K, CL, CO2, GLUCOSE, BUN, CREATININE, CALCIUM,  in the last 72 hours PT/INR No results found for this basename: LABPROT, INR,  in the last 72 hours ABG No results found for this basename: PHART, PCO2, PO2, HCO3,  in the last 72 hours  Studies/Results: Dg Abd 2 Views  10/07/2013   CLINICAL DATA:  Abdominal pain and diarrhea; recent percutaneous drainage catheter placement for a pelvic abscess on July 3rd.  EXAM: ABDOMEN - 2 VIEW  COMPARISON:  CT scan of the abdomen and pelvis of October 03, 2013  FINDINGS: There are loops of moderately distended gas and fluid-filled small bowel. There is oral contrast in the left colon. There is a small amount of gas in the right colon but none in the rectum. There is no free subdiaphragmatic gas. The stomach contains a moderate amount of fluid and gas. The drainage catheter lies in the mid pelvis.  IMPRESSION: The findings are consistent with a distal small bowel obstruction/ileus.   Electronically Signed   By: David  [place]   On: 10/07/2013 08:57    Anti-infectives: Anti-infectives   Start     Dose/Rate Route Frequency Ordered Stop   09/30/13 0600   metroNIDAZOLE (FLAGYL) IVPB 500 mg  Status:  Discontinued     500 mg 100 mL/hr over 60 Minutes Intravenous Every 8 hours 09/30/13 0026 09/30/13 1247   09/30/13 0600  imipenem-cilastatin (PRIMAXIN) 500 mg in sodium chloride 0.9 % 100 mL IVPB     500 mg 200 mL/hr over 30 Minutes Intravenous 4 times per day 09/30/13 0136     09/29/13 2200  imipenem-cilastatin (PRIMAXIN) 500 mg in sodium chloride 0.9 % 100 mL IVPB     500 mg 200 mL/hr over 30 Minutes Intravenous  Once 09/29/13 2157 09/29/13 2353   09/29/13 2200  metroNIDAZOLE (FLAGYL) IVPB 500 mg     500 mg 100 mL/hr over 60 Minutes Intravenous  Once 09/29/13 2157 09/29/13 2320      Assessment/Plan: s/p * No surgery found *  Divertic abscess TG drain in place Tender at site Better tho Will need Re Ct when output less than 10 cc/day May dc with drain if needed.   LOS: 8 days    [place] A 10/07/2013

## 2013-10-07 NOTE — Progress Notes (Signed)
  Subjective: More bloated, burping, having a lot of liquid stool and thin solid stool, hurt more when he ate in abdomen, biggest complaint is gluteal drain, ambulating  Objective: Vital signs in last 24 hours: Temp:  [97.8 F (36.6 C)-98.5 F (36.9 C)] 97.8 F (36.6 C) (07/05 0503) Pulse Rate:  [72-84] 72 (07/05 0503) Resp:  [18] 18 (07/05 0503) BP: (143-160)/(81-97) 143/81 mmHg (07/05 0503) SpO2:  [94 %-97 %] 97 % (07/05 0503) Last BM Date: 10/06/13  Intake/Output from previous day: 07/04 0701 - 07/05 0700 In: 705 [I.V.:600; IV Piggyback:100] Out: 10 [Drains:10] Intake/Output this shift:    General appearance: no distress Resp: clear to auscultation bilaterally Cardio: regular rate and rhythm GI: bs present, nt, mildly distended  Lab Results:   Recent Labs  10/05/13 0508  WBC 8.3  HGB 12.4*  HCT 35.8*  PLT 343   BMET No results found for this basename: NA, K, CL, CO2, GLUCOSE, BUN, CREATININE, CALCIUM,  in the last 72 hours PT/INR  Recent Labs  10/04/13 0839  LABPROT 13.6  INR 1.04   ABG No results found for this basename: PHART, PCO2, PO2, HCO3,  in the last 72 hours  Studies/Results: Ct Image Guided Drainage By Percutaneous Catheter  10/05/2013   Abundio MiuAdam Ryan Henn, MD     10/05/2013 10:11 AM CT guided drain placement in pelvic fluid collection from right  transgluteal approach.  Removed greater than 30 ml of cloudy  yellow fluid.  No immediate complication.     Anti-infectives: Anti-infectives   Start     Dose/Rate Route Frequency Ordered Stop   09/30/13 0600  metroNIDAZOLE (FLAGYL) IVPB 500 mg  Status:  Discontinued     500 mg 100 mL/hr over 60 Minutes Intravenous Every 8 hours 09/30/13 0026 09/30/13 1247   09/30/13 0600  imipenem-cilastatin (PRIMAXIN) 500 mg in sodium chloride 0.9 % 100 mL IVPB     500 mg 200 mL/hr over 30 Minutes Intravenous 4 times per day 09/30/13 0136     09/29/13 2200  imipenem-cilastatin (PRIMAXIN) 500 mg in sodium chloride 0.9  % 100 mL IVPB     500 mg 200 mL/hr over 30 Minutes Intravenous  Once 09/29/13 2157 09/29/13 2353   09/29/13 2200  metroNIDAZOLE (FLAGYL) IVPB 500 mg     500 mg 100 mL/hr over 60 Minutes Intravenous  Once 09/29/13 2157 09/29/13 2320      Assessment/Plan: Complicated diverticulitis s/p drain  1. Iv pain meds 2. ? Ileus- will check film this am, he is passing stool and flatus but still mild distention after eating some yesterday, burping also.  I am also concerned given his description of loose stools with thin ones that he could have stricture that he may need followed up with ct for drain also 3. Check cbc in am 4. Continue iv abx for now given he has worsened since yesterday 5. Lovenox, scds   [facility] Paul[facility] HospitalWAKEFIELD,Iolani Twilley 10/07/2013

## 2013-10-08 ENCOUNTER — Inpatient Hospital Stay (HOSPITAL_COMMUNITY): Payer: BC Managed Care – PPO

## 2013-10-08 ENCOUNTER — Inpatient Hospital Stay (HOSPITAL_COMMUNITY): Payer: BC Managed Care – PPO | Admitting: Registered Nurse

## 2013-10-08 ENCOUNTER — Encounter (HOSPITAL_COMMUNITY): Payer: BC Managed Care – PPO | Admitting: Registered Nurse

## 2013-10-08 ENCOUNTER — Encounter (HOSPITAL_COMMUNITY): Payer: Self-pay | Admitting: Surgery

## 2013-10-08 ENCOUNTER — Encounter (HOSPITAL_COMMUNITY): Admission: EM | Disposition: A | Discharge status: Home Health | Payer: Self-pay | Source: Home / Self Care

## 2013-10-08 DIAGNOSIS — K651 Peritoneal abscess: Secondary | ICD-10-CM

## 2013-10-08 DIAGNOSIS — K565 Intestinal adhesions [bands], unspecified as to partial versus complete obstruction: Secondary | ICD-10-CM

## 2013-10-08 HISTORY — PX: LAPAROSCOPY: SHX197

## 2013-10-08 LAB — CBC
HCT: 31.6 % — ABNORMAL LOW (ref 39.0–52.0)
Hemoglobin: 10.8 g/dL — ABNORMAL LOW (ref 13.0–17.0)
MCH: 30.6 pg (ref 26.0–34.0)
MCHC: 34.2 g/dL (ref 30.0–36.0)
MCV: 89.5 fL (ref 78.0–100.0)
PLATELETS: 486 10*3/uL — AB (ref 150–400)
RBC: 3.53 MIL/uL — ABNORMAL LOW (ref 4.22–5.81)
RDW: 13.7 % (ref 11.5–15.5)
WBC: 10.9 10*3/uL — ABNORMAL HIGH (ref 4.0–10.5)

## 2013-10-08 LAB — CULTURE, ROUTINE-ABSCESS: Culture: NO GROWTH

## 2013-10-08 LAB — BODY FLUID CULTURE: CULTURE: NO GROWTH

## 2013-10-08 LAB — CREATININE, SERUM
Creatinine, Ser: 1.02 mg/dL (ref 0.50–1.35)
GFR calc Af Amer: >90 mL/min (ref >90)
GFR calc non Af Amer: 84 mL/min — ABNORMAL LOW (ref >90)

## 2013-10-08 LAB — MRSA PCR SCREENING: MRSA by PCR: NEGATIVE

## 2013-10-08 SURGERY — LAPAROSCOPY, DIAGNOSTIC
Anesthesia: General

## 2013-10-08 MED ORDER — LABETALOL HCL 5 MG/ML IV SOLN
INTRAVENOUS | Status: AC
  Filled 2013-10-08: qty 4

## 2013-10-08 MED ORDER — HYDROMORPHONE HCL PF 1 MG/ML IJ SOLN
0.2500 mg | INTRAMUSCULAR | Status: DC | PRN
  Administered 2013-10-08: 0.25 mg via INTRAVENOUS
  Administered 2013-10-08: 0.5 mg via INTRAVENOUS
  Administered 2013-10-08 (×2): 0.25 mg via INTRAVENOUS
  Administered 2013-10-08: 0.5 mg via INTRAVENOUS
  Administered 2013-10-08: 0.25 mg via INTRAVENOUS

## 2013-10-08 MED ORDER — BUPIVACAINE-EPINEPHRINE (PF) 0.25% -1:200000 IJ SOLN
INTRAMUSCULAR | Status: DC | PRN
  Administered 2013-10-08: 20 mL

## 2013-10-08 MED ORDER — LACTATED RINGERS IV SOLN
INTRAVENOUS | Status: DC | PRN
  Administered 2013-10-08 (×3): via INTRAVENOUS

## 2013-10-08 MED ORDER — DEXAMETHASONE SODIUM PHOSPHATE 10 MG/ML IJ SOLN
INTRAMUSCULAR | Status: AC
  Filled 2013-10-08: qty 1

## 2013-10-08 MED ORDER — IOHEXOL 300 MG/ML  SOLN
100.0000 mL | Freq: Once | INTRAMUSCULAR | Status: AC | PRN
  Administered 2013-10-08: 100 mL via INTRAVENOUS

## 2013-10-08 MED ORDER — METOPROLOL TARTRATE 1 MG/ML IV SOLN
5.0000 mg | Freq: Four times a day (QID) | INTRAVENOUS | Status: DC | PRN
  Filled 2013-10-08: qty 5

## 2013-10-08 MED ORDER — LACTATED RINGERS IV SOLN
INTRAVENOUS | Status: DC
  Administered 2013-10-08: 17:00:00 via INTRAVENOUS
  Administered 2013-10-08: 1000 mL via INTRAVENOUS

## 2013-10-08 MED ORDER — ROCURONIUM BROMIDE 100 MG/10ML IV SOLN
INTRAVENOUS | Status: DC | PRN
  Administered 2013-10-08: 10 mg via INTRAVENOUS
  Administered 2013-10-08: 5 mg via INTRAVENOUS
  Administered 2013-10-08 (×2): 10 mg via INTRAVENOUS
  Administered 2013-10-08: 45 mg via INTRAVENOUS

## 2013-10-08 MED ORDER — ONDANSETRON HCL 4 MG/2ML IJ SOLN
INTRAMUSCULAR | Status: DC | PRN
  Administered 2013-10-08: 4 mg via INTRAVENOUS

## 2013-10-08 MED ORDER — NEOSTIGMINE METHYLSULFATE 10 MG/10ML IV SOLN
INTRAVENOUS | Status: AC
  Filled 2013-10-08: qty 1

## 2013-10-08 MED ORDER — NEOSTIGMINE METHYLSULFATE 10 MG/10ML IV SOLN
INTRAVENOUS | Status: DC | PRN
  Administered 2013-10-08: 5 mg via INTRAVENOUS

## 2013-10-08 MED ORDER — GLYCOPYRROLATE 0.2 MG/ML IJ SOLN
INTRAMUSCULAR | Status: DC | PRN
  Administered 2013-10-08: 0.6 mg via INTRAVENOUS

## 2013-10-08 MED ORDER — SODIUM CHLORIDE 0.9 % IR SOLN
Freq: Once | Status: DC
  Filled 2013-10-08: qty 3000

## 2013-10-08 MED ORDER — HYDRALAZINE HCL 20 MG/ML IJ SOLN
5.0000 mg | Freq: Four times a day (QID) | INTRAMUSCULAR | Status: DC | PRN
  Filled 2013-10-08 (×2): qty 1

## 2013-10-08 MED ORDER — LIDOCAINE HCL (CARDIAC) 20 MG/ML IV SOLN
INTRAVENOUS | Status: AC
  Filled 2013-10-08: qty 5

## 2013-10-08 MED ORDER — SUCCINYLCHOLINE CHLORIDE 20 MG/ML IJ SOLN
INTRAMUSCULAR | Status: DC | PRN
  Administered 2013-10-08: 100 mg via INTRAVENOUS

## 2013-10-08 MED ORDER — CHLORHEXIDINE GLUCONATE 4 % EX LIQD
1.0000 "application " | Freq: Once | CUTANEOUS | Status: DC
  Filled 2013-10-08: qty 15

## 2013-10-08 MED ORDER — ROCURONIUM BROMIDE 100 MG/10ML IV SOLN
INTRAVENOUS | Status: AC
  Filled 2013-10-08: qty 1

## 2013-10-08 MED ORDER — SODIUM CHLORIDE 0.9 % IJ SOLN
INTRAMUSCULAR | Status: AC
  Filled 2013-10-08: qty 10

## 2013-10-08 MED ORDER — BISACODYL 10 MG RE SUPP: 10.0000 mg | Freq: Two times a day (BID) | RECTAL | Status: DC | PRN

## 2013-10-08 MED ORDER — PROPOFOL 10 MG/ML IV BOLUS
INTRAVENOUS | Status: AC
  Filled 2013-10-08: qty 20

## 2013-10-08 MED ORDER — HYDROMORPHONE HCL PF 2 MG/ML IJ SOLN
INTRAMUSCULAR | Status: AC
  Filled 2013-10-08: qty 1

## 2013-10-08 MED ORDER — DIPHENHYDRAMINE HCL 50 MG/ML IJ SOLN
12.5000 mg | Freq: Four times a day (QID) | INTRAMUSCULAR | Status: DC | PRN
  Filled 2013-10-08: qty 1

## 2013-10-08 MED ORDER — METRONIDAZOLE IN NACL 5-0.79 MG/ML-% IV SOLN
500.0000 mg | Freq: Four times a day (QID) | INTRAVENOUS | Status: DC
  Administered 2013-10-08 – 2013-10-25 (×66): 500 mg via INTRAVENOUS
  Filled 2013-10-08 (×71): qty 100

## 2013-10-08 MED ORDER — ALUM & MAG HYDROXIDE-SIMETH 200-200-20 MG/5ML PO SUSP: 30.0000 mL | Freq: Four times a day (QID) | ORAL | Status: DC | PRN

## 2013-10-08 MED ORDER — HYDROMORPHONE HCL PF 1 MG/ML IJ SOLN
1.0000 mg | INTRAMUSCULAR | Status: DC | PRN
  Administered 2013-10-08: 1 mg via INTRAVENOUS
  Administered 2013-10-08 – 2013-10-10 (×24): 2 mg via INTRAVENOUS
  Administered 2013-10-11: 1 mg via INTRAVENOUS
  Administered 2013-10-11 (×2): 2 mg via INTRAVENOUS
  Administered 2013-10-11: 1 mg via INTRAVENOUS
  Administered 2013-10-11 (×2): 2 mg via INTRAVENOUS
  Administered 2013-10-11: 1 mg via INTRAVENOUS
  Administered 2013-10-11 – 2013-10-12 (×2): 2 mg via INTRAVENOUS
  Administered 2013-10-12: 1 mg via INTRAVENOUS
  Filled 2013-10-08 (×11): qty 2
  Filled 2013-10-08: qty 1
  Filled 2013-10-08 (×5): qty 2
  Filled 2013-10-08 (×2): qty 1
  Filled 2013-10-08 (×10): qty 2
  Filled 2013-10-08: qty 1
  Filled 2013-10-08 (×3): qty 2
  Filled 2013-10-08: qty 1
  Filled 2013-10-08: qty 2

## 2013-10-08 MED ORDER — HYDROMORPHONE HCL PF 1 MG/ML IJ SOLN
INTRAMUSCULAR | Status: AC
  Filled 2013-10-08: qty 1

## 2013-10-08 MED ORDER — FLUCONAZOLE IN SODIUM CHLORIDE 200-0.9 MG/100ML-% IV SOLN
200.0000 mg | INTRAVENOUS | Status: AC
  Administered 2013-10-08 – 2013-10-23 (×16): 200 mg via INTRAVENOUS
  Filled 2013-10-08 (×17): qty 100

## 2013-10-08 MED ORDER — LACTATED RINGERS IV BOLUS (SEPSIS): 1000.0000 mL | Freq: Three times a day (TID) | INTRAVENOUS | Status: AC | PRN

## 2013-10-08 MED ORDER — ONDANSETRON HCL 4 MG/2ML IJ SOLN
INTRAMUSCULAR | Status: AC
  Filled 2013-10-08: qty 2

## 2013-10-08 MED ORDER — MAGIC MOUTHWASH
15.0000 mL | Freq: Four times a day (QID) | ORAL | Status: DC | PRN
  Filled 2013-10-08: qty 15

## 2013-10-08 MED ORDER — KETAMINE HCL 10 MG/ML IJ SOLN
INTRAMUSCULAR | Status: AC
  Filled 2013-10-08: qty 1

## 2013-10-08 MED ORDER — SUFENTANIL CITRATE 50 MCG/ML IV SOLN
INTRAVENOUS | Status: AC
  Filled 2013-10-08: qty 1

## 2013-10-08 MED ORDER — HYDRALAZINE HCL 20 MG/ML IJ SOLN
INTRAMUSCULAR | Status: DC | PRN
  Administered 2013-10-08: 2.5 mg via INTRAVENOUS
  Administered 2013-10-08 (×3): 5 mg via INTRAVENOUS
  Administered 2013-10-08: 2.5 mg via INTRAVENOUS

## 2013-10-08 MED ORDER — ENOXAPARIN SODIUM 40 MG/0.4ML ~~LOC~~ SOLN
40.0000 mg | SUBCUTANEOUS | Status: DC
  Filled 2013-10-08: qty 0.4

## 2013-10-08 MED ORDER — PROMETHAZINE HCL 25 MG/ML IJ SOLN: 6.2500 mg | INTRAMUSCULAR | Status: DC | PRN

## 2013-10-08 MED ORDER — MIDAZOLAM HCL 2 MG/2ML IJ SOLN
INTRAMUSCULAR | Status: AC
  Filled 2013-10-08: qty 2

## 2013-10-08 MED ORDER — PROPOFOL 10 MG/ML IV BOLUS
INTRAVENOUS | Status: DC | PRN
  Administered 2013-10-08: 200 mg via INTRAVENOUS

## 2013-10-08 MED ORDER — SUFENTANIL CITRATE 50 MCG/ML IV SOLN
INTRAVENOUS | Status: DC | PRN
  Administered 2013-10-08: 10 ug via INTRAVENOUS
  Administered 2013-10-08: 15 ug via INTRAVENOUS
  Administered 2013-10-08: 10 ug via INTRAVENOUS
  Administered 2013-10-08: 15 ug via INTRAVENOUS
  Administered 2013-10-08 (×5): 10 ug via INTRAVENOUS

## 2013-10-08 MED ORDER — PHENOL 1.4 % MT LIQD
2.0000 | OROMUCOSAL | Status: DC | PRN
  Administered 2013-10-08: 2 via OROMUCOSAL
  Filled 2013-10-08: qty 177

## 2013-10-08 MED ORDER — LIDOCAINE HCL (CARDIAC) 20 MG/ML IV SOLN
INTRAVENOUS | Status: DC | PRN
  Administered 2013-10-08: 25 mg via INTRATRACHEAL
  Administered 2013-10-08: 75 mg via INTRAVENOUS

## 2013-10-08 MED ORDER — DEXAMETHASONE SODIUM PHOSPHATE 10 MG/ML IJ SOLN
INTRAMUSCULAR | Status: DC | PRN
  Administered 2013-10-08: 10 mg via INTRAVENOUS

## 2013-10-08 MED ORDER — STERILE WATER FOR IRRIGATION IR SOLN
Status: DC | PRN
  Administered 2013-10-08: 1500 mL

## 2013-10-08 MED ORDER — MIDAZOLAM HCL 5 MG/5ML IJ SOLN
INTRAMUSCULAR | Status: DC | PRN
  Administered 2013-10-08: 2 mg via INTRAVENOUS

## 2013-10-08 MED ORDER — HYDRALAZINE HCL 20 MG/ML IJ SOLN
INTRAMUSCULAR | Status: AC
  Filled 2013-10-08: qty 1

## 2013-10-08 MED ORDER — MENTHOL 3 MG MT LOZG
1.0000 | LOZENGE | OROMUCOSAL | Status: DC | PRN
  Filled 2013-10-08 (×2): qty 9

## 2013-10-08 MED ORDER — LABETALOL HCL 5 MG/ML IV SOLN
INTRAVENOUS | Status: DC | PRN
  Administered 2013-10-08 (×4): 5 mg via INTRAVENOUS

## 2013-10-08 MED ORDER — KETOROLAC TROMETHAMINE 15 MG/ML IJ SOLN
15.0000 mg | Freq: Four times a day (QID) | INTRAMUSCULAR | Status: DC | PRN
  Administered 2013-10-09 (×2): 30 mg via INTRAVENOUS
  Administered 2013-10-10: 15 mg via INTRAVENOUS
  Filled 2013-10-08: qty 2
  Filled 2013-10-08: qty 1
  Filled 2013-10-08 (×2): qty 2

## 2013-10-08 MED ORDER — GLYCOPYRROLATE 0.2 MG/ML IJ SOLN
INTRAMUSCULAR | Status: AC
  Filled 2013-10-08: qty 3

## 2013-10-08 MED ORDER — SODIUM CHLORIDE 0.9 % IV SOLN
INTRAVENOUS | Status: DC | PRN
  Administered 2013-10-08 (×6)

## 2013-10-08 MED ORDER — KETAMINE HCL 10 MG/ML IJ SOLN
INTRAMUSCULAR | Status: DC | PRN
  Administered 2013-10-08: 20 mg via INTRAVENOUS
  Administered 2013-10-08: 50 mg via INTRAVENOUS

## 2013-10-08 MED ORDER — BUPIVACAINE-EPINEPHRINE (PF) 0.25% -1:200000 IJ SOLN
INTRAMUSCULAR | Status: AC
  Filled 2013-10-08: qty 30

## 2013-10-08 MED ORDER — LIP MEDEX EX OINT
1.0000 "application " | TOPICAL_OINTMENT | Freq: Two times a day (BID) | CUTANEOUS | Status: DC
  Administered 2013-10-08 – 2013-10-27 (×35): 1 via TOPICAL
  Filled 2013-10-08 (×5): qty 7

## 2013-10-08 SURGICAL SUPPLY — 41 items
CANISTER SUCTION 2500CC (MISCELLANEOUS) ×2 IMPLANT
CATH KIT ON Q 7.5IN SLV (PAIN MANAGEMENT) IMPLANT
DECANTER SPIKE VIAL GLASS SM (MISCELLANEOUS) ×2 IMPLANT
DRAIN CHANNEL 19F RND (DRAIN) ×2 IMPLANT
DRAPE LAPAROSCOPIC ABDOMINAL (DRAPES) ×2 IMPLANT
DRAPE UTILITY XL STRL (DRAPES) ×2 IMPLANT
DRAPE WARM FLUID 44X44 (DRAPE) ×2 IMPLANT
DRSG TEGADERM 2-3/8X2-3/4 SM (GAUZE/BANDAGES/DRESSINGS) IMPLANT
DRSG TEGADERM 4X4.75 (GAUZE/BANDAGES/DRESSINGS) IMPLANT
ELECT REM PT RETURN 9FT ADLT (ELECTROSURGICAL) ×2
ELECTRODE REM PT RTRN 9FT ADLT (ELECTROSURGICAL) ×1 IMPLANT
EVACUATOR SILICONE 100CC (DRAIN) ×2 IMPLANT
GLOVE ECLIPSE 8.0 STRL XLNG CF (GLOVE) ×2 IMPLANT
GLOVE INDICATOR 8.0 STRL GRN (GLOVE) ×2 IMPLANT
GOWN STRL REUS W/TWL XL LVL3 (GOWN DISPOSABLE) ×6 IMPLANT
KIT BASIN OR (CUSTOM PROCEDURE TRAY) ×2 IMPLANT
SCISSORS LAP 5X35 DISP (ENDOMECHANICALS) ×2 IMPLANT
SEALER TISSUE G2 CVD JAW 35 (ENDOMECHANICALS) IMPLANT
SEALER TISSUE G2 CVD JAW 45CM (ENDOMECHANICALS)
SEALER TISSUE G2 STRG ARTC 35C (ENDOMECHANICALS) IMPLANT
SET IRRIG TUBING LAPAROSCOPIC (IRRIGATION / IRRIGATOR) IMPLANT
SLEEVE XCEL OPT CAN 5 100 (ENDOMECHANICALS) ×4 IMPLANT
SPONGE GAUZE 4X4 12PLY (GAUZE/BANDAGES/DRESSINGS) ×1 IMPLANT
SUT MNCRL AB 4-0 PS2 18 (SUTURE) ×2 IMPLANT
SUT PROLENE 2 0 CT2 30 (SUTURE) ×2 IMPLANT
SUT SILK 2 0 (SUTURE)
SUT SILK 2 0 SH CR/8 (SUTURE) IMPLANT
SUT SILK 2-0 18XBRD TIE 12 (SUTURE) IMPLANT
SUT SILK 3 0 (SUTURE)
SUT SILK 3 0 SH CR/8 (SUTURE) IMPLANT
SUT SILK 3-0 18XBRD TIE 12 (SUTURE) IMPLANT
SUT VIC AB 3-0 SH 18 (SUTURE) IMPLANT
TOWEL OR 17X26 10 PK STRL BLUE (TOWEL DISPOSABLE) ×2 IMPLANT
TOWEL OR NON WOVEN STRL DISP B (DISPOSABLE) ×2 IMPLANT
TRAY FOLEY CATH 14FRSI W/METER (CATHETERS) ×1 IMPLANT
TRAY FOLEY CATH 16FR SILVER (SET/KITS/TRAYS/PACK) ×1 IMPLANT
TRAY LAP CHOLE (CUSTOM PROCEDURE TRAY) ×2 IMPLANT
TROCAR BLADELESS OPT 5 100 (ENDOMECHANICALS) ×2 IMPLANT
TROCAR XCEL NON-BLD 11X100MML (ENDOMECHANICALS) IMPLANT
TUBING INSUFFLATION 10FT LAP (TUBING) ×2 IMPLANT
TUNNELER SHEATH ON-Q 16GX12 DP (PAIN MANAGEMENT) IMPLANT

## 2013-10-08 NOTE — Op Note (Addendum)
09/29/2013 - 10/08/2013  3:57 PM  PATIENT:  Randy Park  53 y.o. male  Patient Care Team: Lanell Personshomas Kingsley, MD as PCP - General (Family Medicine) Meryl DareMalcolm T Stark, MD as Consulting Physician (Gastroenterology)  PRE-OPERATIVE DIAGNOSIS:   Sigmoid diverticulitis with interloop & pelvic abscesses SBO  POST-OPERATIVE DIAGNOSIS:    Sigmoid diverticulitis with interloop & pelvic abscesses SBO  PROCEDURE:  Procedure(s): LAPAROSCOPIC LYSIS OF ADHESIONS x10190min LAPAROSCOPIC DRAINAGE OF ABDOMINAL ABSCESS X2 with drain placement  SURGEON:  Surgeon(s): Ardeth SportsmanSteven C. Taaj Hurlbut, MD  ASSISTANT: RN   ANESTHESIA:   local and general  EBL:  Total I/O In: 2700 [I.V.:2700] Out: 750 [Urine:700; Blood:50]  Delay start of Pharmacological VTE agent (>24hrs) due to surgical blood loss or risk of bleeding:  no  DRAINS: (19) Blake drain(s) in the LUQ runs along medial sigmoid colon into pelvis.  RLQ drain runs to pelvis   SPECIMEN:  Source of Specimen:  interloop abscess  DISPOSITION OF SPECIMEN:  C&S  COUNTS:  YES  PLAN OF CARE: Admit to inpatient   PATIENT DISPOSITION:  PACU - hemodynamically stable.  INDICATION:   Patient with evidence of Sigmoid diverticulitis.  Pelvic abscess drained.  A small interloop abscess worse.  Developing bowel obstruction vs ileus.  Because the interloop abscesses worst and ileus is worse, recommended drainage of the abscess.  Not amenable to percutaneous drainage.  I recommended diagnostic laparoscopy with possible laparotomy and drainage of abscesses with washout.  Possible conversion to open.  Possible colectomy and Hartmann procedure:  The anatomy & physiology of the digestive tract was discussed.  The pathophysiology of perforation was discussed.  Differential diagnosis such as perforated ulcer or colon, etc was discussed.   Natural history risks without surgery such as death was discussed.  I recommended abdominal exploration to diagnose & treat the source  of the problem.  Laparoscopic & open techniques were discussed.   Risks such as bleeding, infection, abscess, leak, reoperation, bowel resection, possible ostomy, hernia, heart attack, death, and other risks were discussed.   The risks of no intervention will lead to serious problems including death.   I expressed a good likelihood that surgery will address the problem.    Goals of post-operative recovery were discussed as well.  We will work to minimize complications although risks in an emergent setting are high.   Questions were answered.  The patient expressed understanding & wishes to proceed with surgery.      OR FINDINGS: Patient had a very green thick interloop abscess in the deep low midline.  Also deep pelvic abscess at the rectosigmoid mesentery.  Patient had decompressed ileum.  Transition point of SBO small bowel obstruction near the interloop abscess   DESCRIPTION:   Informed consent was confirmed.  The patient underwent general anaesthesia without difficulty.  The patient was positioned appropriately.  VTE prevention in place.  The patient's abdomen was clipped, prepped, & draped in a sterile fashion.  Surgical timeout confirmed our plan.  The patient was positioned in reverse Trendelenburg.  Abdominal entry with a 5mm port was gained using optical entry technique in the right upper abdomen.  Entry was clean.  I induced carbon dioxide insufflation.  Camera inspection revealed no injury.  Extra ports were carefully placed under direct laparoscopic visualization.  The patient had massively dilated small bowel loops.  Limited viewing w distention but improved w trendelenburg positioning.   Omentum was up in the left upper quadrant.  Eventually was able to get down to the  pelvis.  There were numerous folds of intestine adherent to down to the pelvis.  Carefully freed them off using blunt hydrodissection as well as sharp cold scissors.  Eventually loop by loop I was able to come down to one  final loop.  I began to free that off the sigmoid colon bladder.  I released a thick green purulent abscess cavity around the level of the sacral promontory.  This correlated with the abscess on the CT scan..  Sent fluid for culture.  Irrigated copiously with antibiotic solution (clindamycin/gentamicin).  Washed that out.  Eventually was able to free this final posterior loop of bowel off the pelvis and off the sigmoid colon.  This freed all loops of bowel out of the pelvis and lower abdomen.  Sigmoid colon was still rather inflamed.  It was viable.  There was no feculent contamination.  I could find no obvious hole in the colon.  I ran the small bowel from the ileocecal valve more proximally.  The loops adherent to the pelvis were more in the mid jejunum.  I carefully inspected the areas of lysis of adhesions.  Saw no perforation or serosal tears.  I saw no more transition point once the loops of bowel had been freed off.    We did copious irrigation of additional antibiotic solution for a total of 6 L in the entire abdominal cavity until aspirate was more clear.  I placed drains as noted above through the laparoscopic port sites.  We then evacuated carbon dioxide to remove the ports.  Secured the drains at the skin using 2-0 Prolene stitches.  Because I cannot find the pigtail of the deep pelvic drain, I left that perc drain in for now.  May remove in a few days depending how things go.  I closed the ports using Monocryl stitch a sterile dressings.  I am unable to locate family to discuss intraoperative findings and postoperative care.  We will try later.

## 2013-10-08 NOTE — Transfer of Care (Signed)
Immediate Anesthesia Transfer of Care Note  Patient: Randy Park  Procedure(s) Performed: Procedure(s): LAPAROSCOPIC LYSIS OF ADHESIONS, DRAINAGE OF ABDOMINAL ABSCESS X2 (N/A)  Patient Location: PACU  Anesthesia Type:General  Level of Consciousness: awake, alert , oriented and patient cooperative  Airway & Oxygen Therapy: Patient Spontanous Breathing and Patient connected to face mask oxygen  Post-op Assessment: Report given to PACU RN, Post -op Vital signs reviewed and stable and Patient moving all extremities X 4  Post vital signs: stable  Complications: No apparent anesthesia complications

## 2013-10-08 NOTE — Progress Notes (Signed)
CT scan with interloop abscess w gas - much larger.  Prob transition point of SBO.  No amenable foe perc drainage  I recommended lap possible open exploration of abdomen w drainage of abscesses, possible ex lap.  Try to avoid HArtmann & have delayed colectomy after this round of infections:  The anatomy & physiology of the digestive tract was discussed.  The pathophysiology of perforation was discussed.  Differential diagnosis such as perforated ulcer or colon, etc was discussed.   Natural history risks without surgery such as death was discussed.  I recommended abdominal exploration to diagnose & treat the source of the problem.  Laparoscopic & open techniques were discussed.   Risks such as bleeding, infection, abscess, leak, reoperation, bowel resection, possible ostomy, hernia, heart attack, death, and other risks were discussed.   The risks of no intervention will lead to serious problems including death.   I expressed a good likelihood that surgery will address the problem.    Goals of post-operative recovery were discussed as well.  We will work to minimize complications although risks in an emergent setting are high.   Questions were answered.  The patient expressed understanding & wishes to proceed with surgery.

## 2013-10-08 NOTE — Anesthesia Postprocedure Evaluation (Signed)
  Anesthesia Post-op Note  Patient: Randy Park  Procedure(s) Performed: Procedure(s) (LRB): LAPAROSCOPIC LYSIS OF ADHESIONS, DRAINAGE OF ABDOMINAL ABSCESS X2 (N/A)  Patient Location: PACU  Anesthesia Type: General  Level of Consciousness: awake and alert   Airway and Oxygen Therapy: Patient Spontanous Breathing  Post-op Pain: mild  Post-op Assessment: Post-op Vital signs reviewed, Patient's Cardiovascular Status Stable, Respiratory Function Stable, Patent Airway and No signs of Nausea or vomiting  Last Vitals:  Filed Vitals:   10/08/13 0604  BP: 165/106  Pulse: 83  Temp: 36.7 C  Resp: 20    Post-op Vital Signs: stable   Complications: No apparent anesthesia complications

## 2013-10-08 NOTE — Progress Notes (Addendum)
Sore Getting CT scan to day to r/o persistent/worsening abscess Prob sigmoid stricture? Would benefit from elective colectomy 6 weeks from now once abscess/inflammation resolved.  If not better, may need surgery this admission (washout vs Hartmann/colostomy)

## 2013-10-08 NOTE — Progress Notes (Signed)
NUTRITION FOLLOW UP  Intervention:   - Diet advancement per MD - RD to monitor plan of care   Nutrition Dx:   Inadequate oral intake related to clear liquid diet as evidenced by diet order - ongoing, now related to inability to eat as evidenced by NPO   Goal:   1. Advance diet as tolerated to soft diet - not met 2. Resolution of abdominal pain - not met, had 8/10 pain this morning per PA notes   Monitor:   Weights, labs, diet advancement  Assessment:   Pt presents with lower abdominal pain that started Friday. 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 He went to local hospital where he was started on cipro and flagyl. CT shows sigmoid diverticulitis with contained perforation and partial small bowel obstruction per MD notes.   6/29: -Pt discussed during multidisciplinary rounds.  -Met with pt who reports eating the "wrong" kind of foods PTA, 4-5 meals/day of mostly fast foods  -States his weight has been stable  -Denies any diarrhea since admission, last emesis was Friday, abdominal pain reportedly has remained the same since admission  -Appeared uncomfortable, did not perform nutrition focused physical exam  -Starting clear liquids today  7/6: -Had CT guided drain placement in pelvic fluid collection from right transgluteal approach 7/3 -Per surgeon's notes, pt had CT scan with interloop abscess w gas - much larger -Still with abdominal distention this morning per PA notes -Currently in OR for open exploration of abdomen with drainage of abscesses    Height: Ht Readings from Last 1 Encounters:  09/29/13 $RemoveB'5\' 11"'[id number]$  (1.803 m)    Weight Status:   Wt Readings from Last 1 Encounters:  09/29/13 190 lb (86.183 kg)    Re-estimated needs:  Kcal: 1950-2150  Protein: 90-110g  Fluid: 1.9-2.1L/day   Skin: Intact    Diet Order: NPO   Intake/Output Summary (Last 24 hours) at 10/08/13 1250 Last data filed at 10/07/13 1758  Gross per 24 hour  Intake  622.5 ml  Output      0 ml   Net  622.5 ml    Last BM: 7/4   Labs:   Recent Labs Lab 10/02/13 0525 10/03/13 0536 10/04/13 0525 10/08/13 0534  NA 136* 136* 138  --   K 3.9 3.9 3.7  --   CL 104 101 101  --   CO2 $Re'21 21 24  'NBx$ --   BUN $Re'9 9 8  'kgF$ --   CREATININE 0.91 0.95 0.98 1.02  CALCIUM 8.6 8.9 9.1  --   GLUCOSE 102* 106* 99  --     CBG (last 3)  No results found for this basename: GLUCAP,  in the last 72 hours  Scheduled Meds: . [START ON 10/09/2013] chlorhexidine  1 application Topical Once  . irrigation builder   Irrigation Once  . irrigation builder   Irrigation Once  . [facility] HOLD] imipenem-cilastatin  500 mg Intravenous 4 times per day  . [MAR HOLD] lip balm  1 application Topical BID  . [facility] HOLD] metronidazole  500 mg Intravenous Q6H  . [MAR HOLD] pantoprazole (PROTONIX) IV  40 mg Intravenous QHS    Continuous Infusions: . dextrose 5 % and 0.9 % NaCl with KCl 20 mEq/L Stopped (10/08/13 1236)  . lactated ringers 1,000 mL (10/08/13 1200)    Carlis Stable MS, RD, LDN [phone] Pager [phone] Weekend/After Hours Pager

## 2013-10-08 NOTE — Anesthesia Preprocedure Evaluation (Signed)
Anesthesia Evaluation  Patient identified by MRN, date of birth, ID band Patient awake    Reviewed: Allergy & Precautions, H&P , NPO status , Patient's Chart, lab work & pertinent test results  Airway Mallampati: II TM Distance: >3 FB Neck ROM: Full    Dental no notable dental hx.    Pulmonary Current Smoker,  breath sounds clear to auscultation  Pulmonary exam normal       Cardiovascular negative cardio ROS  Rhythm:Regular Rate:Normal     Neuro/Psych negative neurological ROS  negative psych ROS   GI/Hepatic negative GI ROS, Neg liver ROS,   Endo/Other  negative endocrine ROS  Renal/GU negative Renal ROS  negative genitourinary   Musculoskeletal negative musculoskeletal ROS (+)   Abdominal   Peds negative pediatric ROS (+)  Hematology negative hematology ROS (+)   Anesthesia Other Findings   Reproductive/Obstetrics negative OB ROS                          Anesthesia Physical Anesthesia Plan  ASA: II  Anesthesia Plan: General   Post-op Pain Management:    Induction: Intravenous  Airway Management Planned: Oral ETT  Additional Equipment:   Intra-op Plan:   Post-operative Plan: Extubation in OR  Informed Consent: I have reviewed the patients History and Physical, chart, labs and discussed the procedure including the risks, benefits and alternatives for the proposed anesthesia with the patient or authorized representative who has indicated his/her understanding and acceptance.   Dental advisory given  Plan Discussed with: CRNA and Surgeon  Anesthesia Plan Comments:         Anesthesia Quick Evaluation  

## 2013-10-08 NOTE — Progress Notes (Signed)
  Subjective: He seems comfortable walking had to go to BR when I came in.  He tells the nurse his pain is an 8/10, when he comes back.  Nothing from the drain and his abdomen is still distended.  He had 4 BM's early yesterday,   Objective: Vital signs in last 24 hours: Temp:  [97.8 F (36.6 C)-98.2 F (36.8 C)] 98 F (36.7 C) (07/06 0604) Pulse Rate:  [71-83] 83 (07/06 0604) Resp:  [18-20] 20 (07/06 0604) BP: (155-168)/(84-106) 165/106 mmHg (07/06 0604) SpO2:  [94 %-96 %] 96 % (07/06 0604) Last BM Date: 10/06/13 240 PO recorded.  Diet: clears 4 stools recoreded Nothing from the drain recorded BP is up WBC ups some H/H is down some Film yesterday shows ongoing ileus/obstruction 12 mg MS yesterday He has had 9 days of Primaxin Intake/Output from previous day: 07/05 0701 - 07/06 0700 In: 2043.3 [P.O.:240; I.V.:1298.3; IV Piggyback:500] Out: -  Intake/Output this shift:    General appearance: alert, cooperative and no distress Resp: clear to auscultation bilaterally GI: distended, some tenderness, + BS, JP shows no drainage.  Lab Results:   Recent Labs  10/08/13 0534  WBC 10.9*  HGB 10.8*  HCT 31.6*  PLT 486*    BMET  Recent Labs  10/08/13 0534  CREATININE 1.02   PT/INR No results found for this basename: LABPROT, INR,  in the last 72 hours   Recent Labs Lab 10/04/13 0525  AST 36  ALT 25  ALKPHOS 77  BILITOT 1.2  PROT 7.6  ALBUMIN 3.3*     Lipase     Component Value Date/Time   LIPASE 12 09/29/2013 1848     Studies/Results: Dg Abd 2 Views  10/07/2013   CLINICAL DATA:  Abdominal pain and diarrhea; recent percutaneous drainage catheter placement for a pelvic abscess on July 3rd.  EXAM: ABDOMEN - 2 VIEW  COMPARISON:  CT scan of the abdomen and pelvis of October 03, 2013  FINDINGS: There are loops of moderately distended gas and fluid-filled small bowel. There is oral contrast in the left colon. There is a small amount of gas in the right colon but none  in the rectum. There is no free subdiaphragmatic gas. The stomach contains a moderate amount of fluid and gas. The drainage catheter lies in the mid pelvis.  IMPRESSION: The findings are consistent with a distal small bowel obstruction/ileus.   Electronically Signed   By: David  [place]azil[place]   On: 10/07/2013 08:57    Medications: . enoxaparin (LOVENOX) injection  40 mg Subcutaneous Q24H  . imipenem-cilastatin  500 mg Intravenous 4 times per day  . pantoprazole (PROTONIX) IV  40 mg Intravenous QHS    Assessment/Plan Recurrent Sigmoid diverticulitis with contained perforation  New SB dilatation and possible pneumoperitoneum. CT scan show: rim enhancing 5.5 x 6.9 cm fluid collection in the pelvis,10/03/13; reflecting a developing abscess.  Ileitis  IR drain placement; 10/05/13, Dr. Lowella DandyHenn Pain control   He is taking allot of pain medicine, and this has been an issue since last week.  He is still very distended.  Nothing from the drain yesterday, and nothing in it today.  No growth from either culture from 10/05/13.  This is day 10 of Primaxin.   Plan:  Hold Lovenox till tonight and repeat CT scan this AM.  Last dose of Lovenox 0951 yesterday.  Repeat labs and prealbumin in AM.   LOS: 9 days    Roselene Gray 10/08/2013

## 2013-10-08 NOTE — Progress Notes (Signed)
Received report from PACU, Pt arrived unit, alert and oriented. VSS, Will continue with current plan of care.

## 2013-10-09 ENCOUNTER — Encounter (HOSPITAL_COMMUNITY): Payer: Self-pay | Admitting: Surgery

## 2013-10-09 LAB — COMPREHENSIVE METABOLIC PANEL
ALK PHOS: 75 U/L (ref 39–117)
ALT: 50 U/L (ref 0–53)
AST: 47 U/L — AB (ref 0–37)
Albumin: 2.6 g/dL — ABNORMAL LOW (ref 3.5–5.2)
Anion gap: 13 (ref 5–15)
BILIRUBIN TOTAL: 0.8 mg/dL (ref 0.3–1.2)
BUN: 7 mg/dL (ref 6–23)
CHLORIDE: 97 meq/L (ref 96–112)
CO2: 23 mEq/L (ref 19–32)
Calcium: 8.7 mg/dL (ref 8.4–10.5)
Creatinine, Ser: 0.98 mg/dL (ref 0.50–1.35)
GFR calc Af Amer: >90 mL/min (ref >90)
GFR calc non Af Amer: >90 mL/min (ref >90)
Glucose, Bld: 131 mg/dL — ABNORMAL HIGH (ref 70–99)
POTASSIUM: 4.8 meq/L (ref 3.7–5.3)
SODIUM: 133 meq/L — AB (ref 137–147)
TOTAL PROTEIN: 6.4 g/dL (ref 6.0–8.3)

## 2013-10-09 LAB — CBC
HEMATOCRIT: 33.6 % — AB (ref 39.0–52.0)
Hemoglobin: 11.3 g/dL — ABNORMAL LOW (ref 13.0–17.0)
MCH: 30.5 pg (ref 26.0–34.0)
MCHC: 33.6 g/dL (ref 30.0–36.0)
MCV: 90.8 fL (ref 78.0–100.0)
PLATELETS: 535 10*3/uL — AB (ref 150–400)
RBC: 3.7 MIL/uL — ABNORMAL LOW (ref 4.22–5.81)
RDW: 14 % (ref 11.5–15.5)
WBC: 17.6 10*3/uL — AB (ref 4.0–10.5)

## 2013-10-09 LAB — PREALBUMIN: Prealbumin: 7.6 mg/dL — ABNORMAL LOW (ref 17.0–34.0)

## 2013-10-09 MED ORDER — ENOXAPARIN SODIUM 40 MG/0.4ML ~~LOC~~ SOLN
40.0000 mg | SUBCUTANEOUS | Status: DC
  Administered 2013-10-09 – 2013-10-17 (×9): 40 mg via SUBCUTANEOUS
  Filled 2013-10-09 (×10): qty 0.4

## 2013-10-09 NOTE — Progress Notes (Signed)
Patient with less abdominal pain.  Mod distended but softer w less TTP NGT thin bilious No flatus or BM.  Continue IV antibiotics.  Followup on cultures.  I updated the patient's status to the patient & friend.  Recommendations were made.  Questions were answered.  The patient expressed understanding & appreciation.

## 2013-10-09 NOTE — Progress Notes (Signed)
ANTIBIOTIC CONSULT NOTE - FOLLOW UP  Pharmacy Consult for Primaxin Indication: sigmoid diverticulitis with contained perforation  Allergies  Allergen Reactions  . Amoxicillin Rash    Patient Measurements: Height: 5\' 11"  (180.3 cm) Weight: 190 lb (86.183 kg) IBW/kg (Calculated) : 75.3   Vital Signs: Temp: 97.4 F (36.3 C) (07/07 1219) Temp src: Oral (07/07 1219) BP: 133/74 mmHg (07/07 1219) Pulse Rate: 62 (07/07 1219) Intake/Output from previous day: 07/06 0701 - 07/07 0700 In: 4141.3 [I.V.:3781.3; NG/GT:360] Out: 3743 [Urine:2900; Emesis/NG output:50; Drains:743; Blood:50] Intake/Output from this shift:    Labs:  Recent Labs  10/08/13 0534 10/09/13 0455  WBC 10.9* 17.6*  HGB 10.8* 11.3*  PLT 486* 535*  CREATININE 1.02 0.98   Estimated Creatinine Clearance: 96 ml/min (by C-G formula based on Cr of 0.98). No results found for this basename: VANCOTROUGH, Leodis BinetVANCOPEAK, VANCORANDOM, GENTTROUGH, GENTPEAK, GENTRANDOM, TOBRATROUGH, TOBRAPEAK, TOBRARND, AMIKACINPEAK, AMIKACINTROU, AMIKACIN,  in the last 72 hours   Microbiology: Recent Results (from the past 720 hour(s))  CULTURE, ROUTINE-ABSCESS     Status: None   Collection Time    10/05/13 10:05 AM      Result Value Ref Range Status   Specimen Description PERITONEAL CAVITY   Final   Special Requests NONE   Final   Gram Stain     Final   Value: RARE WBC PRESENT,BOTH PMN AND MONONUCLEAR     NO SQUAMOUS EPITHELIAL CELLS SEEN     NO ORGANISMS SEEN     Performed at [facility]   Culture     Final   Value: NO GROWTH 3 DAYS     Performed at [facility]   Report Status 10/08/2013 FINAL   Final  BODY FLUID CULTURE     Status: None   Collection Time    10/05/13 12:00 PM      Result Value Ref Range Status   Specimen Description PELVIS   Final   Special Requests NONE   Final   Gram Stain     Final   Value: WBC PRESENT,BOTH PMN AND MONONUCLEAR     NO ORGANISMS SEEN     Performed at [facility]    Culture     Final   Value: NO GROWTH 3 DAYS     Performed at [facility]   Report Status 10/08/2013 FINAL   Final  MRSA PCR SCREENING     Status: None   Collection Time    10/08/13 11:02 AM      Result Value Ref Range Status   MRSA by PCR NEGATIVE  NEGATIVE Final   Comment:            The GeneXpert MRSA Assay (FDA     approved for NASAL specimens     only), is one component of a     comprehensive MRSA colonization     surveillance program. It is not     intended to diagnose MRSA     infection nor to guide or     monitor treatment for     MRSA infections.  CULTURE, ROUTINE-ABSCESS     Status: None   Collection Time    10/08/13  1:37 PM      Result Value Ref Range Status   Specimen Description ABSCESS INTRALOOP   Final   Special Requests NONE   Final   Gram Stain PENDING   Incomplete   Culture     Final   Value: NO GROWTH 1 DAY  Performed at [facility]   Report Status PENDING   Incomplete    Anti-infectives   Start     Dose/Rate Route Frequency Ordered Stop   10/08/13 1830  fluconazole (DIFLUCAN) IVPB 200 mg     200 mg 100 mL/hr over 60 Minutes Intravenous Every 24 hours 10/08/13 1740     10/08/13 1346  clindamycin (CLEOCIN) 900 mg, gentamicin (GARAMYCIN) 240 mg in sodium chloride 0.9 % 1,000 mL for intraperitoneal lavage  Status:  Discontinued       As needed 10/08/13 1347 10/08/13 1545   10/08/13 1230  gentamicin (GARAMYCIN) 720 mg, clindamycin (CLEOCIN) 2,700 mg in sodium chloride irrigation 0.9 % 3,000 mL irrigation      Irrigation  Once 10/08/13 1227     10/08/13 1230  gentamicin (GARAMYCIN) 720 mg, clindamycin (CLEOCIN) 2,700 mg in sodium chloride irrigation 0.9 % 3,000 mL irrigation      Irrigation  Once 10/08/13 1228     10/08/13 1030  metroNIDAZOLE (FLAGYL) IVPB 500 mg     500 mg 100 mL/hr over 60 Minutes Intravenous Every 6 hours 10/08/13 1025     09/30/13 0600  metroNIDAZOLE (FLAGYL) IVPB 500 mg  Status:  Discontinued     500 mg 100  mL/hr over 60 Minutes Intravenous Every 8 hours 09/30/13 0026 09/30/13 1247   09/30/13 0600  imipenem-cilastatin (PRIMAXIN) 500 mg in sodium chloride 0.9 % 100 mL IVPB     500 mg 200 mL/hr over 30 Minutes Intravenous 4 times per day 09/30/13 0136     09/29/13 2200  imipenem-cilastatin (PRIMAXIN) 500 mg in sodium chloride 0.9 % 100 mL IVPB     500 mg 200 mL/hr over 30 Minutes Intravenous  Once 09/29/13 2157 09/29/13 2353   09/29/13 2200  metroNIDAZOLE (FLAGYL) IVPB 500 mg     500 mg 100 mL/hr over 60 Minutes Intravenous  Once 09/29/13 2157 09/29/13 2320      Assessment: 52 yoM admitted with sigmoid diverticulitis with contained perforation for bowel rest and IV antibiotics.  Pharmacy consulted to dose Primaxin.  Went to surgery yesterday for laparoscopic drainage of abdominal abscess and two drains were placed. 6L of antibiotic solution (Clinda/Gent) was irrigated in the cavity. Fluconazole and Flagyl started after surgery for intra abdominal infection.   6/27 >> Flagyl >> 6/28 6/28 >> Primaxin >> 7/6 >> Fluconazole (MD) >> 7/6 >> Flagyl (MD) >>  Tmax: Afeb WBCs: had improved, now back up to 10.9-> 17.6. Renal: SCr wnl/stable, CrCl 87B  7/3 peritoneal cavity cx: ng F 7/3 pelvic body fluid cx: ng F 7/6 culture of abscess intraloop: ngtd   Goal of Therapy:  Eradication of infection  Plan:  Continue Primaxin 500 mg IV q6h.  Loma BostonLaura Rorey Hodges, PharmD Pager: [phone] 10/09/2013 12:40 PM

## 2013-10-09 NOTE — Progress Notes (Signed)
1 Day Post-Op  Subjective: He is still having pain but feels better.  No flatus, he says the drain is leaking some from around the site.  Objective: Vital signs in last 24 hours: Temp:  [97.8 F (36.6 C)-98.7 F (37.1 C)] 98.1 F (36.7 C) (07/07 0758) Pulse Rate:  [67-96] 68 (07/07 0758) Resp:  [10-18] 18 (07/07 0758) BP: (134-179)/(70-92) 147/77 mmHg (07/07 0758) SpO2:  [93 %-100 %] 96 % (07/07 0758) Last BM Date: 10/06/13 743 from the abdominal drains Afebrile, BP is better, but was up yesterday WBC up to 17.6 H/H stable, CMP OK Prealbumin pending Intake/Output from previous day: 07/06 0701 - 07/07 0700 In: 4141.3 [I.V.:3781.3; NG/GT:360] Out: 3743 [Urine:2900; Emesis/NG output:50; Drains:743; Blood:50] Intake/Output this shift:    General appearance: alert, cooperative and no distress Resp: clear to auscultation bilaterally GI: soft, he says pain is a 6/10, it was 8/10 yesterday.  No BS, no flatus.  Drains empty now so I can't really describe the fluid.  Lab Results:   Recent Labs  10/08/13 0534 10/09/13 0455  WBC 10.9* 17.6*  HGB 10.8* 11.3*  HCT 31.6* 33.6*  PLT 486* 535*    BMET  Recent Labs  10/08/13 0534 10/09/13 0455  NA  --  133*  K  --  4.8  CL  --  97  CO2  --  23  GLUCOSE  --  131*  BUN  --  7  CREATININE 1.02 0.98  CALCIUM  --  8.7   PT/INR No results found for this basename: LABPROT, INR,  in the last 72 hours   Recent Labs Lab 10/04/13 0525 10/09/13 0455  AST 36 47*  ALT 25 50  ALKPHOS 77 75  BILITOT 1.2 0.8  PROT 7.6 6.4  ALBUMIN 3.3* 2.6*     Lipase     Component Value Date/Time   LIPASE 12 09/29/2013 1848     Studies/Results: Ct Abdomen Pelvis W Contrast  10/08/2013   CLINICAL DATA:  Diverticulitis with drain.  EXAM: CT ABDOMEN AND PELVIS WITH CONTRAST  TECHNIQUE: Multidetector CT imaging of the abdomen and pelvis was performed using the standard protocol following bolus administration of intravenous contrast.   CONTRAST:  100mL OMNIPAQUE IOHEXOL 300 MG/ML  SOLN  COMPARISON:  CT abdomen pelvis 10/03/2013, 10/05/2013, and 09/29/2013.  FINDINGS: Areas of confluent density within the lung bases left greater than right. Trace bilateral effusions.  The liver, spleen, adrenals, pancreas, kidneys are unremarkable. Echogenic sludge is appreciated within the gallbladder.  The focal loculated region of air within the central pelvis adjacent to the sigmoid colon consistent with sequela of perforation has increased in size and now contains an air-fluid level. This area measures 5.5 x 4.1 cm. A loop of distal ileum and portions of the terminal ileum course along the lateral and posterior borders of this collection. There is inflammatory change within the surrounding mesenteric fat. The small bowel proximal to this region is dilated and contains air fluid levels.  A drain is been placed within the fluid collection in the posterior lower pelvis. This collection has decreased in size consistent with drainage presently measuring 3.9 x 4.6 cm. The drain is coped centrally within the fluid collection.  The diverticulitis within the sigmoid colon is stable to slightly less prominent.  IMPRESSION: The loculated air collection within the central pelvis adjacent to the sigmoid colon consistent with local perforation has increased in size and now contains an air-fluid level. (image 57 series 2) There is surrounding inflammatory  change. These findings were discussed with Randy Park at the time of the initial interpretation.  Decreased size of the deep posterior pelvic fluid collection consistent with drainage and catheter insertion.  Small bowel obstruction which appears be due to a component of mass effect and possible adjacent small bowel tethering secondary to the air-fluid collection in the central pelvis with surrounding inflammatory changes. The obstruction is also likely secondary to inflammatory edema within the adjacent small bowel.   Atelectasis versus infiltrate within the lung bases and small effusions   Electronically Signed   By: Randy HolmesHector  Park M.D.   On: 10/08/2013 10:48   Dg Abd 2 Views  10/07/2013   CLINICAL DATA:  Abdominal pain and diarrhea; recent percutaneous drainage catheter placement for a pelvic abscess on July 3rd.  EXAM: ABDOMEN - 2 VIEW  COMPARISON:  CT scan of the abdomen and pelvis of October 03, 2013  FINDINGS: There are loops of moderately distended gas and fluid-filled small bowel. There is oral contrast in the left colon. There is a small amount of gas in the right colon but none in the rectum. There is no free subdiaphragmatic gas. The stomach contains a moderate amount of fluid and gas. The drainage catheter lies in the mid pelvis.  IMPRESSION: The findings are consistent with a distal small bowel obstruction/ileus.   Electronically Signed   By: Randy Park   On: 10/07/2013 08:57    Medications: . fluconazole (DIFLUCAN) IV  200 mg Intravenous Q24H  . irrigation builder   Irrigation Once  . irrigation builder   Irrigation Once  . imipenem-cilastatin  500 mg Intravenous 4 times per day  . lip balm  1 application Topical BID  . metronidazole  500 mg Intravenous Q6H  . pantoprazole (PROTONIX) IV  40 mg Intravenous QHS   . dextrose 5 % and 0.9 % NaCl with KCl 20 mEq/L 50 mL/hr at 10/08/13 2039    Assessment/Plan 1.  Recurrent Sigmoid diverticulitis with contained perforation admitted 09/29/13  2.  New SB dilatation and possible pneumoperitoneum. CT scan show: rim enhancing 5.5 x 6.9 cm fluid  collection in the pelvis,10/03/13; reflecting a developing abscess. IR drain placement; 10/05/13, Randy Park   3.  Sigmoid diverticulitis with interloop & pelvic abscesses; SBO:   LAPAROSCOPIC DRAINAGE OF  ABDOMINAL ABSCESS X2 with drain placement, LAPAROSCOPIC LYSIS OF ADHESIONS x  90min. 10/08/2013, Randy SportsmanSteven C. Gross, MD.  4.  Pain control     Plan:  I am going to leave the NG in, it is working with clear bilious  drainage.  Get him up and moving.  Ice chips and sips.   Dressing changes to drains and port sites.  Start Lovenox for DVT, continue SCD.    LOS: 10 days    Randy Park 10/09/2013

## 2013-10-10 ENCOUNTER — Inpatient Hospital Stay (HOSPITAL_COMMUNITY): Payer: BC Managed Care – PPO

## 2013-10-10 NOTE — Progress Notes (Signed)
2 Days Post-Op  Subjective: Randy Park feels better Randy Park thinks the NG may have come out some.  No flatus, I'm not sure about the drainage from the NG, cannister changed this AM.  Objective: Vital signs in last 24 hours: Temp:  [97.4 F (36.3 C)-98.2 F (36.8 C)] 98.2 F (36.8 C) (07/08 0535) Pulse Rate:  [62-99] 81 (07/08 0535) Resp:  [18-20] 20 (07/08 0535) BP: (129-146)/(68-81) 146/80 mmHg (07/08 0535) SpO2:  [93 %-97 %] 94 % (07/08 0535) Last BM Date: 10/07/13 100 from drain,  720 in NG recorded 500 from NG recorded. Afebrile, VSS No labs Intake/Output from previous day: 07/07 0701 - 07/08 0700 In: 2693.3 [I.V.:1873.3; NG/GT:720] Out: 850 [Urine:250; Emesis/NG output:500; Drains:100] Intake/Output this shift: Total I/O In: -  Out: 100 [Urine:100]  General appearance: alert, cooperative and no distress Resp: clear to auscultation bilaterally GI: still distended some, no BS, port sites ok.  Lab Results:   Recent Labs  10/08/13 0534 10/09/13 0455  WBC 10.9* 17.6*  HGB 10.8* 11.3*  HCT 31.6* 33.6*  PLT 486* 535*    BMET  Recent Labs  10/08/13 0534 10/09/13 0455  NA  --  133*  K  --  4.8  CL  --  97  CO2  --  23  GLUCOSE  --  131*  BUN  --  7  CREATININE 1.02 0.98  CALCIUM  --  8.7   PT/INR No results found for this basename: LABPROT, INR,  in the last 72 hours   Recent Labs Lab 10/04/13 0525 10/09/13 0455  AST 36 47*  ALT 25 50  ALKPHOS 77 75  BILITOT 1.2 0.8  PROT 7.6 6.4  ALBUMIN 3.3* 2.6*     Lipase     Component Value Date/Time   LIPASE 12 09/29/2013 1848     Studies/Results: No results found.  Medications: . enoxaparin (LOVENOX) injection  40 mg Subcutaneous Q24H  . fluconazole (DIFLUCAN) IV  200 mg Intravenous Q24H  . irrigation builder   Irrigation Once  . irrigation builder   Irrigation Once  . imipenem-cilastatin  500 mg Intravenous 4 times per day  . lip balm  1 application Topical BID  . metronidazole  500 mg Intravenous Q6H   . pantoprazole (PROTONIX) IV  40 mg Intravenous QHS    Assessment/Plan 1. Recurrent Sigmoid diverticulitis with contained perforation admitted 09/29/13   2. New SB dilatation and possible pneumoperitoneum. CT scan show: rim enhancing 5.5 x 6.9 cm fluid collection in the pelvis,10/03/13; reflecting a developing abscess. IR drain placement; 10/05/13, Dr. Lowella DandyHenn   3. Sigmoid diverticulitis with interloop & pelvic abscesses; SBO: LAPAROSCOPIC DRAINAGE OF ABDOMINAL ABSCESS X2 with drain placement, LAPAROSCOPIC LYSIS OF ADHESIONS x 90min. 10/08/2013, Randy SportsmanSteven C. Gross, MD.   4. Pain control   Plan:  Nothing from IR drain, not sure about the NG.  I will check position of his NG, continue antibiotics and plan to clamp NG once Randy Park has some flatus. Labs in Am  LOS: 11 days    Randy Park 10/10/2013

## 2013-10-10 NOTE — Progress Notes (Signed)
Right transgluteal percutaneous drain removed by [facility]. IR will sign off  Pattricia BossKoreen Shaelynn Dragos PA-C Interventional Radiology  10/10/13  1:06 PM

## 2013-10-10 NOTE — Progress Notes (Signed)
Feeling better.  Pelvic drain with minimal output.  I removed.  No flatus.  Abdomen densely softer.  Nasogastric output going down.  Hopefully ileus resolving.  Consider IV parenteral nutrition if he does not open up in the next 2 days.

## 2013-10-10 NOTE — Progress Notes (Signed)
Patient ambulating in hallway well.  He verbalized that he feels abdomen feels tighter after he walks than when he sits in chair.

## 2013-10-10 NOTE — Progress Notes (Signed)
Subjective: Patient states he is doing ok today, he denies any flatus. He c/o right TG perc drain pain and is concerned if LUQ drain is leaking because of dressing saturation.   Objective: Physical Exam: BP 146/80  Pulse 81  Temp(Src) 98.2 F (36.8 C) (Oral)  Resp 20  Ht 5\' 11"  (1.803 m)  Wt 190 lb (86.183 kg)  BMI 26.51 kg/m2  SpO2 94%  General: A&Ox3, NAD Abd: Soft, slightly distended, Right TG perc drain (IR 7/3) with 1-3 cc of serous output, 24 hr 3cc documented, tenderness at site. Surgical JP drains intact with output.   Labs: CBC  Recent Labs  10/08/13 0534 10/09/13 0455  WBC 10.9* 17.6*  HGB 10.8* 11.3*  HCT 31.6* 33.6*  PLT 486* 535*   BMET  Recent Labs  10/08/13 0534 10/09/13 0455  NA  --  133*  K  --  4.8  CL  --  97  CO2  --  23  GLUCOSE  --  131*  BUN  --  7  CREATININE 1.02 0.98  CALCIUM  --  8.7   LFT  Recent Labs  10/09/13 0455  PROT 6.4  ALBUMIN 2.6*  AST 47*  ALT 50  ALKPHOS 75  BILITOT 0.8   PT/INR No results found for this basename: LABPROT, INR,  in the last 72 hours   Studies/Results: No results found.  Assessment/Plan: Recurrent sigmoid diverticulitis with perforation S/p Right TG perc drain placed in IR 7/3, follow up CT 7/6 revealed increased interloop abscess S/p Lap lysis of adhesions and abscess drainage with (2) additional JP drains placed in OR 7/6 Right TG drain with minimal output, recent imaging reviewed with Dr. Archer AsaMcCullough today, will slightly pull right TG drain back to relieve any tube kink or malfunction today, if output continues to be minimal consider repeat CT 7/9 and possible removal of right TG.  Plans per [facility]   LOS: 11 days    Berneta LevinsMORGAN, Pamula Luther D PA-C 10/10/2013 11:20 AM

## 2013-10-11 LAB — BASIC METABOLIC PANEL
ANION GAP: 11 (ref 5–15)
BUN: 11 mg/dL (ref 6–23)
CO2: 24 meq/L (ref 19–32)
CREATININE: 0.97 mg/dL (ref 0.50–1.35)
Calcium: 8.5 mg/dL (ref 8.4–10.5)
Chloride: 98 mEq/L (ref 96–112)
GFR calc Af Amer: >90 mL/min (ref >90)
GFR calc non Af Amer: >90 mL/min (ref >90)
GLUCOSE: 106 mg/dL — AB (ref 70–99)
Potassium: 4.1 mEq/L (ref 3.7–5.3)
Sodium: 133 mEq/L — ABNORMAL LOW (ref 137–147)

## 2013-10-11 LAB — CBC
HCT: 31.3 % — ABNORMAL LOW (ref 39.0–52.0)
HEMOGLOBIN: 10.5 g/dL — AB (ref 13.0–17.0)
MCH: 30.7 pg (ref 26.0–34.0)
MCHC: 33.5 g/dL (ref 30.0–36.0)
MCV: 91.5 fL (ref 78.0–100.0)
PLATELETS: 579 10*3/uL — AB (ref 150–400)
RBC: 3.42 MIL/uL — AB (ref 4.22–5.81)
RDW: 14.2 % (ref 11.5–15.5)
WBC: 11.7 10*3/uL — AB (ref 4.0–10.5)

## 2013-10-11 LAB — CULTURE, ROUTINE-ABSCESS

## 2013-10-11 MED ORDER — ACETAMINOPHEN 160 MG/5ML PO SOLN
650.0000 mg | ORAL | Status: DC | PRN
  Administered 2013-10-12: 650 mg via ORAL
  Filled 2013-10-11: qty 20.3

## 2013-10-11 MED ORDER — OXYCODONE HCL 5 MG/5ML PO SOLN
5.0000 mg | ORAL | Status: DC | PRN
  Administered 2013-10-12 – 2013-10-13 (×2): 10 mg via ORAL
  Filled 2013-10-11 (×2): qty 10

## 2013-10-11 NOTE — Progress Notes (Signed)
3 Days Post-Op  Subjective: He is feeling better, sore walking last PM, but he says he is using less pain medicine.  Objective: Vital signs in last 24 hours: Temp:  [98.2 F (36.8 C)-98.9 F (37.2 C)] 98.3 F (36.8 C) (07/09 0657) Pulse Rate:  [80-88] 80 (07/09 0657) Resp:  [20] 20 (07/09 0657) BP: (156-161)/(87-92) 158/87 mmHg (07/09 0657) SpO2:  [90 %-96 %] 96 % (07/09 0657) Last BM Date: 10/07/13 360 in NG recorded yesterday, 350 ml out NG yesterday Description  ABSCESS INTRALOOP   Culture  MODERATE CANDIDA ALBICANS  No growth from IR culture Afebrile, VSS WBC down, BMP OK Film yesterday shows NG in place along with SB ileus Intake/Output from previous day: 07/08 0701 - 07/09 0700 In: 1560 [I.V.:1200; NG/GT:360] Out: 1275 [Urine:725; Emesis/NG output:350; Drains:200] Intake/Output this shift:    General appearance: alert, cooperative and no distress Resp: clear to auscultation bilaterally GI: soft, mildly distended, has some bowel sounds now, and passing some flatus.  Sites look fine.  drainage from JP's is clear.  Lab Results:   Recent Labs  10/09/13 0455 10/11/13 0531  WBC 17.6* 11.7*  HGB 11.3* 10.5*  HCT 33.6* 31.3*  PLT 535* 579*    BMET  Recent Labs  10/09/13 0455 10/11/13 0531  NA 133* 133*  K 4.8 4.1  CL 97 98  CO2 23 24  GLUCOSE 131* 106*  BUN 7 11  CREATININE 0.98 0.97  CALCIUM 8.7 8.5   PT/INR No results found for this basename: LABPROT, INR,  in the last 72 hours   Recent Labs Lab 10/09/13 0455  AST 47*  ALT 50  ALKPHOS 75  BILITOT 0.8  PROT 6.4  ALBUMIN 2.6*     Lipase     Component Value Date/Time   LIPASE 12 09/29/2013 1848     Studies/Results: Dg Abd 1 View  10/10/2013   CLINICAL DATA:  Ruptured diverticulitis and status post percutaneous and laparoscopic drainage of abscesses.  EXAM: ABDOMEN - 1 VIEW  COMPARISON:  CT on 10/08/2013.  FINDINGS: Nasogastric tube extends into the stomach with the tip located in the  proximal body. Gas-filled and mildly dilated loops of small bowel present. Findings most likely relate to postoperative ileus. No gross free air identified.  IMPRESSION: Nasogastric tube extends into stomach.  Small bowel ileus.   Electronically Signed   By: Irish LackGlenn  Yamagata M.D.   On: 10/10/2013 11:50    Medications: . enoxaparin (LOVENOX) injection  40 mg Subcutaneous Q24H  . fluconazole (DIFLUCAN) IV  200 mg Intravenous Q24H  . imipenem-cilastatin  500 mg Intravenous 4 times per day  . lip balm  1 application Topical BID  . metronidazole  500 mg Intravenous Q6H  . pantoprazole (PROTONIX) IV  40 mg Intravenous QHS    Assessment/Plan 1. Recurrent Sigmoid diverticulitis with contained perforation admitted 09/29/13  2. New SB dilatation and possible pneumoperitoneum. CT scan show: rim enhancing 5.5 x 6.9 cm fluid collection in the pelvis,10/03/13; reflecting a developing abscess. IR drain placement; 10/05/13, Dr. Lowella DandyHenn  3. Sigmoid diverticulitis with interloop & pelvic abscesses; SBO: LAPAROSCOPIC DRAINAGE OF ABDOMINAL ABSCESS X2 with drain placement, LAPAROSCOPIC LYSIS OF ADHESIONS x 90min. 10/08/2013, Ardeth SportsmanSteven C. Gross, MD.  4. Pain control   Plan:  Clamp NG, continue to mobilize, start ice chips and sips.  He is on diflucan to cover Candida.     LOS: 12 days    Cathie Bonnell 10/11/2013

## 2013-10-11 NOTE — Progress Notes (Signed)
Ileus seems to be resolving.  Agree with nasogastric tube clamping trials.  Followup on cultures.  Candida albicans = continue fluconazle

## 2013-10-11 NOTE — Progress Notes (Signed)
NG Tube clamped  For 2 hrs, Pt unable to tolerate, back to suction. Will continue to monitor.

## 2013-10-12 ENCOUNTER — Encounter: Payer: BC Managed Care – PPO | Admitting: Gastroenterology

## 2013-10-12 MED ORDER — HYDROMORPHONE HCL PF 1 MG/ML IJ SOLN
1.0000 mg | INTRAMUSCULAR | Status: DC | PRN
  Administered 2013-10-12: 2 mg via INTRAVENOUS
  Administered 2013-10-12 – 2013-10-13 (×3): 1 mg via INTRAVENOUS
  Administered 2013-10-13: 2 mg via INTRAVENOUS
  Administered 2013-10-13: 1 mg via INTRAVENOUS
  Administered 2013-10-13 – 2013-10-16 (×10): 2 mg via INTRAVENOUS
  Administered 2013-10-16: 1 mg via INTRAVENOUS
  Administered 2013-10-17 (×7): 2 mg via INTRAVENOUS
  Administered 2013-10-17: 1 mg via INTRAVENOUS
  Administered 2013-10-17 – 2013-10-18 (×2): 2 mg via INTRAVENOUS
  Administered 2013-10-18: 1 mg via INTRAVENOUS
  Administered 2013-10-18: 2 mg via INTRAVENOUS
  Administered 2013-10-18: 1 mg via INTRAVENOUS
  Administered 2013-10-18 (×2): 2 mg via INTRAVENOUS
  Filled 2013-10-12: qty 2
  Filled 2013-10-12: qty 1
  Filled 2013-10-12 (×7): qty 2
  Filled 2013-10-12: qty 1
  Filled 2013-10-12 (×6): qty 2
  Filled 2013-10-12: qty 1
  Filled 2013-10-12 (×7): qty 2
  Filled 2013-10-12: qty 1
  Filled 2013-10-12 (×3): qty 2
  Filled 2013-10-12: qty 1
  Filled 2013-10-12: qty 2
  Filled 2013-10-12: qty 1
  Filled 2013-10-12: qty 2

## 2013-10-12 NOTE — Progress Notes (Signed)
ANTIBIOTIC CONSULT NOTE - FOLLOW UP  Pharmacy Consult for Primaxin Indication: sigmoid diverticulitis with contained perforation  Allergies  Allergen Reactions  . Amoxicillin Rash    Patient Measurements: Height: 5\' 11"  (180.3 cm) Weight: 190 lb (86.183 kg) IBW/kg (Calculated) : 75.3   Vital Signs: Temp: 98.3 F (36.8 C) (07/10 0615) Temp src: Oral (07/10 0615) BP: 147/87 mmHg (07/10 0615) Pulse Rate: 80 (07/10 0615) Intake/Output from previous day: 07/09 0701 - 07/10 0700 In: 5272.2 [I.V.:1572.2; IV Piggyback:3700] Out: 2005 [Urine:1675; Emesis/NG output:200; Drains:130] Intake/Output from this shift: Total I/O In: 0  Out: 500 [Urine:500]  Labs:  Recent Labs  10/11/13 0531  WBC 11.7*  HGB 10.5*  PLT 579*  CREATININE 0.97   Estimated Creatinine Clearance: 97 ml/min (by C-G formula based on Cr of 0.97). No results found for this basename: VANCOTROUGH, Leodis BinetVANCOPEAK, VANCORANDOM, GENTTROUGH, GENTPEAK, GENTRANDOM, TOBRATROUGH, TOBRAPEAK, TOBRARND, AMIKACINPEAK, AMIKACINTROU, AMIKACIN,  in the last 72 hours   Microbiology: Recent Results (from the past 720 hour(s))  CULTURE, ROUTINE-ABSCESS     Status: None   Collection Time    10/05/13 10:05 AM      Result Value Ref Range Status   Specimen Description PERITONEAL CAVITY   Final   Special Requests NONE   Final   Gram Stain     Final   Value: RARE WBC PRESENT,BOTH PMN AND MONONUCLEAR     NO SQUAMOUS EPITHELIAL CELLS SEEN     NO ORGANISMS SEEN     [facility]formed [facility]   Culture     Final   Value: NO GROWTH 3 DAYS     Performed at [facility]   Report Status 10/08/2013 FINAL   Final  BODY FLUID CULTURE     Status: None   Collection Time    10/05/13 12:00 PM      Result Value Ref Range Status   Specimen Description PELVIS   Final   Special Requests NONE   Final   Gram Stain     Final   Value: WBC PRESENT,BOTH PMN AND MONONUCLEAR     NO ORGANISMS SEEN     Performed at [facility]    Culture     Final   Value: NO GROWTH 3 DAYS     Performed at [facility]   Report Status 10/08/2013 FINAL   Final  MRSA PCR SCREENING     Status: None   Collection Time    10/08/13 11:02 AM      Result Value Ref Range Status   MRSA by PCR NEGATIVE  NEGATIVE Final   Comment:            The GeneXpert MRSA Assay (FDA     approved for NASAL specimens     only), is one component of a     comprehensive MRSA colonization     surveillance program. It is not     intended to diagnose MRSA     infection nor to guide or     monitor treatment for     MRSA infections.  CULTURE, ROUTINE-ABSCESS     Status: None   Collection Time    10/08/13  1:37 PM      Result Value Ref Range Status   Specimen Description ABSCESS INTRALOOP   Final   Special Requests NONE   Final   Gram Stain     Final   Value: RARE WBC PRESENT, PREDOMINANTLY MONONUCLEAR     NO SQUAMOUS EPITHELIAL  CELLS SEEN     NO ORGANISMS SEEN     Performed at [facility]   Culture     Final   Value: MODERATE CANDIDA ALBICANS     Performed at [facility]   Report Status 10/11/2013 FINAL   Final  ANAEROBIC CULTURE     Status: None   Collection Time    10/08/13  1:37 PM      Result Value Ref Range Status   Specimen Description ABSCESS INTRALOOP   Final   Special Requests NONE   Final   Gram Stain     Final   Value: RARE WBC PRESENT, PREDOMINANTLY MONONUCLEAR     NO SQUAMOUS EPITHELIAL CELLS SEEN     NO ORGANISMS SEEN     Performed at [facility]   Culture     Final   Value: NO ANAEROBES ISOLATED; CULTURE IN PROGRESS FOR 5 DAYS     Performed at [facility]   Report Status PENDING   Incomplete   Anti-infectives: 6/27 >> Flagyl >> 6/28 6/28 >> Primaxin >> 7/6 >> Fluconazole (MD) >> 7/6 >> Flagyl (MD) >> .  Assessment: 40 yoM with sigmoid diverticulitis with contained perforation admitted for bowel rest and IV antibiotics.  Pharmacy was consulted to dose Primaxin.  Went to  surgery 7/6 for laparoscopic drainage of abdominal abscess and two drains were placed. 6L of antibiotic solution (Clinda/Gent) was irrigated in the cavity. Fluconazole and Flagyl were added postoperatively.   7/10: D#13 Primaxin 500 mg IV q6h D#5   Flagyl 500 mg IV q6h [MD dosing] D#5   Fluconazole 200 mg IV q24h [MD dosing] Afebrile WBC improving when last checked (7/9) No new culture data Renal function stable   Goal of Therapy:  Appropriate antibiotic dosing for renal function; eradication of infection.   Plan:  1. Continue Primaxin 500 mg IV q6h. 2. Additional antimicrobials as ordered by MD 3. Follow serum creatinine, any additional cultures, clinical course.  Elie Goody, PharmD, BCPS Pager: [phone] 10/12/2013  10:39 AM  .

## 2013-10-12 NOTE — Progress Notes (Signed)
4 Days Post-Op  Subjective: He is feeling better, more flatus, and +BS .  Sore but not like what he had before surgery. Objective: Vital signs in last 24 hours: Temp:  [98 F (36.7 C)-98.5 F (36.9 C)] 98.3 F (36.8 C) (07/10 0615) Pulse Rate:  [80-85] 80 (07/10 0615) Resp:  [18-20] 18 (07/10 0615) BP: (147-158)/(87-91) 147/87 mmHg (07/10 0615) SpO2:  [95 %-97 %] 97 % (07/10 0615) Last BM Date: 10/07/13 200 from NG, NPO 130 from drains Afebrile, BP still up some No labs Intake/Output from previous day: 07/09 0701 - 07/10 0700 In: 5272.2 [I.V.:1572.2; IV Piggyback:3700] Out: 2005 [Urine:1675; Emesis/NG output:200; Drains:130] Intake/Output this shift: Total I/O In: 0  Out: 500 [Urine:500]  General appearance: alert, cooperative and no distress Resp: clear to auscultation bilaterally GI: sore sitting up, no nausea, +BS, +flatus.  Lab Results:   Recent Labs  10/11/13 0531  WBC 11.7*  HGB 10.5*  HCT 31.3*  PLT 579*    BMET  Recent Labs  10/11/13 0531  NA 133*  K 4.1  CL 98  CO2 24  GLUCOSE 106*  BUN 11  CREATININE 0.97  CALCIUM 8.5   PT/INR No results found for this basename: LABPROT, INR,  in the last 72 hours   Recent Labs Lab 10/09/13 0455  AST 47*  ALT 50  ALKPHOS 75  BILITOT 0.8  PROT 6.4  ALBUMIN 2.6*     Lipase     Component Value Date/Time   LIPASE 12 09/29/2013 1848     Studies/Results: Dg Abd 1 View  10/10/2013   CLINICAL DATA:  Ruptured diverticulitis and status post percutaneous and laparoscopic drainage of abscesses.  EXAM: ABDOMEN - 1 VIEW  COMPARISON:  CT on 10/08/2013.  FINDINGS: Nasogastric tube extends into the stomach with the tip located in the proximal body. Gas-filled and mildly dilated loops of small bowel present. Findings most likely relate to postoperative ileus. No gross free air identified.  IMPRESSION: Nasogastric tube extends into stomach.  Small bowel ileus.   Electronically Signed   By: Irish LackGlenn  Yamagata M.D.   On:  10/10/2013 11:50    Medications: . enoxaparin (LOVENOX) injection  40 mg Subcutaneous Q24H  . fluconazole (DIFLUCAN) IV  200 mg Intravenous Q24H  . imipenem-cilastatin  500 mg Intravenous 4 times per day  . lip balm  1 application Topical BID  . metronidazole  500 mg Intravenous Q6H  . pantoprazole (PROTONIX) IV  40 mg Intravenous QHS    Assessment/Plan 1. Recurrent Sigmoid diverticulitis with contained perforation admitted 09/29/13  2. New SB dilatation and possible pneumoperitoneum. CT scan show: rim enhancing 5.5 x 6.9 cm fluid collection in the pelvis,10/03/13; reflecting a developing abscess. IR drain placement; 10/05/13, Dr. Lowella DandyHenn  3. Sigmoid diverticulitis with interloop & pelvic abscesses; SBO: LAPAROSCOPIC DRAINAGE OF ABDOMINAL ABSCESS X2 with drain placement, LAPAROSCOPIC LYSIS OF ADHESIONS x 90min. 10/08/2013, Ardeth SportsmanSteven C. Gross, MD.  4. Pain control   Plan:  Leave tube in but clamped, ice chips and sips, continue IV antibiotics.   LOS: 13 days    Even Budlong 10/12/2013

## 2013-10-12 NOTE — Progress Notes (Signed)
Patient seen and examined.  Agree with PA's note. Improving very slowly.  Still with some ileus.

## 2013-10-13 LAB — BASIC METABOLIC PANEL
Anion gap: 13 (ref 5–15)
BUN: 9 mg/dL (ref 6–23)
CO2: 25 mEq/L (ref 19–32)
Calcium: 8.7 mg/dL (ref 8.4–10.5)
Chloride: 97 mEq/L (ref 96–112)
Creatinine, Ser: 0.93 mg/dL (ref 0.50–1.35)
GFR calc Af Amer: >90 mL/min (ref >90)
GFR calc non Af Amer: >90 mL/min (ref >90)
GLUCOSE: 103 mg/dL — AB (ref 70–99)
POTASSIUM: 3.9 meq/L (ref 3.7–5.3)
Sodium: 135 mEq/L — ABNORMAL LOW (ref 137–147)

## 2013-10-13 LAB — CBC
HEMATOCRIT: 30.9 % — AB (ref 39.0–52.0)
HEMOGLOBIN: 10.8 g/dL — AB (ref 13.0–17.0)
MCH: 31.2 pg (ref 26.0–34.0)
MCHC: 35 g/dL (ref 30.0–36.0)
MCV: 89.3 fL (ref 78.0–100.0)
Platelets: 787 10*3/uL — ABNORMAL HIGH (ref 150–400)
RBC: 3.46 MIL/uL — ABNORMAL LOW (ref 4.22–5.81)
RDW: 13.8 % (ref 11.5–15.5)
WBC: 8.5 10*3/uL (ref 4.0–10.5)

## 2013-10-13 LAB — ANAEROBIC CULTURE

## 2013-10-13 NOTE — Progress Notes (Signed)
Patient ID: Randy Park, male   DOB: [birth year], 53 y.o.   MRN: [id number] 5 Days Post-Op  Subjective: Feels much better today. Denies abdominal pain. No nausea with NG clamped for 24 hours. Has had flatus and bowel movements.  Objective: Vital signs in last 24 hours: Temp:  [98 F (36.7 C)-98.2 F (36.8 C)] 98.1 F (36.7 C) (07/11 0607) Pulse Rate:  [74-79] 75 (07/11 0607) Resp:  [16-18] 18 (07/11 0607) BP: (135-152)/(86-93) 135/86 mmHg (07/11 0607) SpO2:  [94 %-96 %] 94 % (07/11 0607) Last BM Date: 10/07/13  Intake/Output from previous day: 07/10 0701 - 07/11 0700 In: 790 [I.V.:250; IV Piggyback:500] Out: 810 [Urine:750; Drains:60] Intake/Output this shift: Total I/O In: 1260 [I.V.:860; IV Piggyback:400] Out: -   General appearance: alert, cooperative and no distress GI: normal findings: soft, non-tender and nondistended Incision/Wound: dressing clean and dry. The JP drainage serosanguineous  Lab Results:   Recent Labs  10/11/13 0531 10/13/13 0601  WBC 11.7* 8.5  HGB 10.5* 10.8*  HCT 31.3* 30.9*  PLT 579* 787*   BMET  Recent Labs  10/11/13 0531 10/13/13 0601  NA 133* 135*  K 4.1 3.9  CL 98 97  CO2 24 25  GLUCOSE 106* 103*  BUN 11 9  CREATININE 0.97 0.93  CALCIUM 8.5 8.7     Studies/Results: No results found.  Anti-infectives: Anti-infectives   Start     Dose/Rate Route Frequency Ordered Stop   10/08/13 1830  fluconazole (DIFLUCAN) IVPB 200 mg     200 mg 100 mL/hr over 60 Minutes Intravenous Every 24 hours 10/08/13 1740     10/08/13 1346  clindamycin (CLEOCIN) 900 mg, gentamicin (GARAMYCIN) 240 mg in sodium chloride 0.9 % 1,000 mL for intraperitoneal lavage  Status:  Discontinued       As needed 10/08/13 1347 10/08/13 1545   10/08/13 1230  gentamicin (GARAMYCIN) 720 mg, clindamycin (CLEOCIN) 2,700 mg in sodium chloride irrigation 0.9 % 3,000 mL irrigation  Status:  Discontinued      Irrigation  Once 10/08/13 1227 10/11/13 0804   10/08/13  1230  gentamicin (GARAMYCIN) 720 mg, clindamycin (CLEOCIN) 2,700 mg in sodium chloride irrigation 0.9 % 3,000 mL irrigation  Status:  Discontinued      Irrigation  Once 10/08/13 1228 10/11/13 0803   10/08/13 1030  metroNIDAZOLE (FLAGYL) IVPB 500 mg     500 mg 100 mL/hr over 60 Minutes Intravenous Every 6 hours 10/08/13 1025     09/30/13 0600  metroNIDAZOLE (FLAGYL) IVPB 500 mg  Status:  Discontinued     500 mg 100 mL/hr over 60 Minutes Intravenous Every 8 hours 09/30/13 0026 09/30/13 1247   09/30/13 0600  imipenem-cilastatin (PRIMAXIN) 500 mg in sodium chloride 0.9 % 100 mL IVPB     500 mg 200 mL/hr over 30 Minutes Intravenous 4 times per day 09/30/13 0136     09/29/13 2200  imipenem-cilastatin (PRIMAXIN) 500 mg in sodium chloride 0.9 % 100 mL IVPB     500 mg 200 mL/hr over 30 Minutes Intravenous  Once 09/29/13 2157 09/29/13 2353   09/29/13 2200  metroNIDAZOLE (FLAGYL) IVPB 500 mg     500 mg 100 mL/hr over 60 Minutes Intravenous  Once 09/29/13 2157 09/29/13 2320      Assessment/Plan: 1. Recurrent Sigmoid diverticulitis with contained perforation admitted 09/29/13  2. New SB dilatation and possible pneumoperitoneum. CT scan show: rim enhancing 5.5 x 6.9 cm fluid collection in the pelvis,10/03/13; reflecting a developing abscess. IR drain placement; 10/05/13, Dr.  Henn  3. Sigmoid diverticulitis with interloop & pelvic abscesses; SBO: LAPAROSCOPIC DRAINAGE OF ABDOMINAL ABSCESS X2 with drain placement, LAPAROSCOPIC LYSIS OF ADHESIONS x . 10/08/2013, Randy Sportsman, MD Recent cultures for Candida. On Primaxin, Flagyl and Diflucan Ileus appears resolved with return of bowel function. White count normalized. Much improved Discontinue NG tube. Clear liquid diet.   LOS: 14 days    Randy Park T 10/13/2013

## 2013-10-14 MED ORDER — OXYCODONE HCL 5 MG PO TABS
10.0000 mg | ORAL_TABLET | ORAL | Status: DC | PRN
  Administered 2013-10-14 – 2013-10-15 (×4): 10 mg via ORAL
  Filled 2013-10-14 (×4): qty 2

## 2013-10-14 NOTE — Progress Notes (Signed)
Patient ID: Randy Park, male   DOB: [birth year], 53 y.o.   MRN: [id number] Patient ID: Randy Park, male   DOB: 12-08-1960, 53 y.o.   MRN: [id number] 6 Days Post-Op  Subjective: No complaints. Tolerating clear liquid diet. Has had further bowel movements.  Objective: Vital signs in last 24 hours: Temp:  [97.1 F (36.2 C)-98.3 F (36.8 C)] 97.9 F (36.6 C) (07/12 0657) Pulse Rate:  [73-89] 89 (07/12 0657) Resp:  [16-20] 20 (07/12 0657) BP: (133-149)/(77-84) 134/84 mmHg (07/12 0657) SpO2:  [95 %-99 %] 95 % (07/12 0657) Last BM Date: 10/13/13  Intake/Output from previous day: 07/11 0701 - 07/12 0700 In: 3595.8 [P.O.:240; I.V.:2000.8; IV Piggyback:1300] Out: 1077 [Urine:1020; Drains:55; Stool:2] Intake/Output this shift:    General appearance: alert, cooperative and no distress GI: normal findings: soft, non-tender and nondistended Incision/Wound: dressing clean and dry. The JP drainage serosanguineous  Lab Results:   Recent Labs  10/13/13 0601  WBC 8.5  HGB 10.8*  HCT 30.9*  PLT 787*   BMET  Recent Labs  10/13/13 0601  NA 135*  K 3.9  CL 97  CO2 25  GLUCOSE 103*  BUN 9  CREATININE 0.93  CALCIUM 8.7     Studies/Results: No results found.  Anti-infectives: Anti-infectives   Start     Dose/Rate Route Frequency Ordered Stop   10/08/13 1830  fluconazole (DIFLUCAN) IVPB 200 mg     200 mg 100 mL/hr over 60 Minutes Intravenous Every 24 hours 10/08/13 1740     10/08/13 1346  clindamycin (CLEOCIN) 900 mg, gentamicin (GARAMYCIN) 240 mg in sodium chloride 0.9 % 1,000 mL for intraperitoneal lavage  Status:  Discontinued       As needed 10/08/13 1347 10/08/13 1545   10/08/13 1230  gentamicin (GARAMYCIN) 720 mg, clindamycin (CLEOCIN) 2,700 mg in sodium chloride irrigation 0.9 % 3,000 mL irrigation  Status:  Discontinued      Irrigation  Once 10/08/13 1227 10/11/13 0804   10/08/13 1230  gentamicin (GARAMYCIN) 720 mg, clindamycin (CLEOCIN) 2,700 mg in  sodium chloride irrigation 0.9 % 3,000 mL irrigation  Status:  Discontinued      Irrigation  Once 10/08/13 1228 10/11/13 0803   10/08/13 1030  metroNIDAZOLE (FLAGYL) IVPB 500 mg     500 mg 100 mL/hr over 60 Minutes Intravenous Every 6 hours 10/08/13 1025     09/30/13 0600  metroNIDAZOLE (FLAGYL) IVPB 500 mg  Status:  Discontinued     500 mg 100 mL/hr over 60 Minutes Intravenous Every 8 hours 09/30/13 0026 09/30/13 1247   09/30/13 0600  imipenem-cilastatin (PRIMAXIN) 500 mg in sodium chloride 0.9 % 100 mL IVPB     500 mg 200 mL/hr over 30 Minutes Intravenous 4 times per day 09/30/13 0136     09/29/13 2200  imipenem-cilastatin (PRIMAXIN) 500 mg in sodium chloride 0.9 % 100 mL IVPB     500 mg 200 mL/hr over 30 Minutes Intravenous  Once 09/29/13 2157 09/29/13 2353   09/29/13 2200  metroNIDAZOLE (FLAGYL) IVPB 500 mg     500 mg 100 mL/hr over 60 Minutes Intravenous  Once 09/29/13 2157 09/29/13 2320      Assessment/Plan: 1. Recurrent Sigmoid diverticulitis with contained perforation admitted 09/29/13  2. New SB dilatation and possible pneumoperitoneum. CT scan show: rim enhancing 5.5 x 6.9 cm fluid collection in the pelvis,10/03/13; reflecting a developing abscess. IR drain placement; 10/05/13, Dr. Lowella Dandy  3. Sigmoid diverticulitis with interloop & pelvic abscesses; SBO: LAPAROSCOPIC DRAINAGE OF ABDOMINAL ABSCESS  X2 with drain placement, LAPAROSCOPIC LYSIS OF ADHESIONS x 90min. 10/08/2013, Ardeth SportsmanSteven C. Gross, MD Recent cultures for Candida. On Primaxin, Flagyl and Diflucan Ileus appears resolved with return of bowel function. White count normalized. Much improved Advanced to full liquid diet   LOS: 15 days    Randy Park T 10/14/2013

## 2013-10-15 LAB — URINALYSIS, ROUTINE W REFLEX MICROSCOPIC
Glucose, UA: NEGATIVE mg/dL
Hgb urine dipstick: NEGATIVE
KETONES UR: NEGATIVE mg/dL
Nitrite: POSITIVE — AB
PH: 6.5 (ref 5.0–8.0)
Protein, ur: NEGATIVE mg/dL
Specific Gravity, Urine: 1.017 (ref 1.005–1.030)
Urobilinogen, UA: 1 mg/dL (ref 0.0–1.0)

## 2013-10-15 LAB — URINE MICROSCOPIC-ADD ON

## 2013-10-15 LAB — CBC
HCT: 31.2 % — ABNORMAL LOW (ref 39.0–52.0)
Hemoglobin: 10.6 g/dL — ABNORMAL LOW (ref 13.0–17.0)
MCH: 30.5 pg (ref 26.0–34.0)
MCHC: 34 g/dL (ref 30.0–36.0)
MCV: 89.7 fL (ref 78.0–100.0)
Platelets: 867 10*3/uL — ABNORMAL HIGH (ref 150–400)
RBC: 3.48 MIL/uL — AB (ref 4.22–5.81)
RDW: 14.1 % (ref 11.5–15.5)
WBC: 8.6 10*3/uL (ref 4.0–10.5)

## 2013-10-15 MED ORDER — OXYCODONE HCL 5 MG PO TABS
5.0000 mg | ORAL_TABLET | ORAL | Status: DC | PRN
  Administered 2013-10-15 – 2013-10-16 (×5): 15 mg via ORAL
  Filled 2013-10-15 (×5): qty 3

## 2013-10-15 MED ORDER — ENSURE COMPLETE PO LIQD: 237.0000 mL | Freq: Two times a day (BID) | ORAL | Status: DC

## 2013-10-15 MED ORDER — SULFAMETHOXAZOLE-TMP DS 800-160 MG PO TABS
1.0000 | ORAL_TABLET | Freq: Two times a day (BID) | ORAL | Status: DC
  Administered 2013-10-15 – 2013-10-16 (×4): 1 via ORAL
  Filled 2013-10-15 (×6): qty 1

## 2013-10-15 MED ORDER — ENSURE COMPLETE PO LIQD
237.0000 mL | Freq: Two times a day (BID) | ORAL | Status: DC
  Administered 2013-10-15 – 2013-10-17 (×4): 237 mL via ORAL

## 2013-10-15 NOTE — Progress Notes (Signed)
7 Days Post-Op  Subjective: It still hurts to have BM, and voiding.  He says his urine is dark.  He is trying not to eat to much because of this. He feels much better. Drains are showing clear serous fluid.  Objective: Vital signs in last 24 hours: Temp:  [97.9 F (36.6 C)-98.5 F (36.9 C)] 98.5 F (36.9 C) (07/13 0439) Pulse Rate:  [70-79] 79 (07/13 0439) Resp:  [18] 18 (07/13 0439) BP: (135-139)/(77-85) 135/83 mmHg (07/13 0439) SpO2:  [94 %-97 %] 94 % (07/13 0439) Last BM Date: 10/15/13 40 ml from the drains,  1 BM 320 PO recorded Afebrile, VSS WBC 8.6 Intake/Output from previous day: 07/12 0701 - 07/13 0700 In: 2019.2 [P.O.:320; I.V.:1149.2; IV Piggyback:500] Out: 441 [Urine:400; Drains:40; Stool:1] Intake/Output this shift: Total I/O In: -  Out: 350 [Urine:350]  General appearance: alert, cooperative and no distress Resp: clear to auscultation bilaterally GI: soft sore, still using some IV pain medicine.  + BS, and BM.  No air with voiding.  Lab Results:   Recent Labs  10/13/13 0601 10/15/13 0535  WBC 8.5 8.6  HGB 10.8* 10.6*  HCT 30.9* 31.2*  PLT 787* 867*    BMET  Recent Labs  10/13/13 0601  NA 135*  K 3.9  CL 97  CO2 25  GLUCOSE 103*  BUN 9  CREATININE 0.93  CALCIUM 8.7   PT/INR No results found for this basename: LABPROT, INR,  in the last 72 hours   Recent Labs Lab 10/09/13 0455  AST 47*  ALT 50  ALKPHOS 75  BILITOT 0.8  PROT 6.4  ALBUMIN 2.6*     Lipase     Component Value Date/Time   LIPASE 12 09/29/2013 1848     Studies/Results: No results found.  Medications: . enoxaparin (LOVENOX) injection  40 mg Subcutaneous Q24H  . fluconazole (DIFLUCAN) IV  200 mg Intravenous Q24H  . imipenem-cilastatin  500 mg Intravenous 4 times per day  . lip balm  1 application Topical BID  . metronidazole  500 mg Intravenous Q6H  . pantoprazole (PROTONIX) IV  40 mg Intravenous QHS    Assessment/Plan 1. Recurrent Sigmoid diverticulitis  with contained perforation admitted 09/29/13  2. New SB dilatation and possible pneumoperitoneum. CT scan show: rim enhancing 5.5 x 6.9 cm fluid collection in the pelvis,10/03/13; reflecting a developing abscess. IR drain placement; 10/05/13, Dr. Lowella DandyHenn  3. Sigmoid diverticulitis with interloop & pelvic abscesses; SBO: LAPAROSCOPIC DRAINAGE OF ABDOMINAL ABSCESS X2 with drain placement, LAPAROSCOPIC LYSIS OF ADHESIONS x 90min. 10/08/2013, Ardeth SportsmanSteven C. Gross, MD.  4. Pain control   Plan:  Soft diet, continue IV antibiotics, check urine, saline lock IV.  I will check on plans for removing drains.   Day 8 of fluconazole today,    Day 8 of Flagyl  Day 16 of Primaxin  If he is doing well we hope to send him home tomorrow.   Dilaudid 6 mg yesterday, 20 mg oxycodone yesterday and today so far.  Urine:  + for nitrates, and some WBC's.  Urine culture pending  I will add Bactrim for now.  LOS: 16 days    Alaijah Gibler 10/15/2013

## 2013-10-15 NOTE — Progress Notes (Signed)
ANTIBIOTIC CONSULT NOTE - FOLLOW UP  Pharmacy Consult for Primaxin Indication: sigmoid diverticulitis with contained perforation  Allergies  Allergen Reactions  . Amoxicillin Rash    Patient Measurements: Height: 5\' 11"  (180.3 cm) Weight: 190 lb (86.183 kg) IBW/kg (Calculated) : 75.3   Vital Signs: Temp: 98.5 F (36.9 C) (07/13 0439) Temp src: Oral (07/13 0439) BP: 135/83 mmHg (07/13 0439) Pulse Rate: 79 (07/13 0439) Intake/Output from previous day: 07/12 0701 - 07/13 0700 In: 2019.2 [P.O.:320; I.V.:1149.2; IV Piggyback:500] Out: 441 [Urine:400; Drains:40; Stool:1] Intake/Output from this shift: Total I/O In: -  Out: 350 [Urine:350]  Labs:  Recent Labs  10/13/13 0601 10/15/13 0535  WBC 8.5 8.6  HGB 10.8* 10.6*  PLT 787* 867*  CREATININE 0.93  --    Estimated Creatinine Clearance: 101.2 ml/min (by C-G formula based on Cr of 0.93). No results found for this basename: VANCOTROUGH, Leodis BinetVANCOPEAK, VANCORANDOM, GENTTROUGH, GENTPEAK, GENTRANDOM, TOBRATROUGH, TOBRAPEAK, TOBRARND, AMIKACINPEAK, AMIKACINTROU, AMIKACIN,  in the last 72 hours   Microbiology: Recent Results (from the past 720 hour(s))  CULTURE, ROUTINE-ABSCESS     Status: None   Collection Time    10/05/13 10:05 AM      Result Value Ref Range Status   Specimen Description PERITONEAL CAVITY   Final   Special Requests NONE   Final   Gram Stain     Final   Value: RARE WBC PRESENT,BOTH PMN AND MONONUCLEAR     NO SQUAMOUS EPITHELIAL CELLS SEEN     NO ORGANISMS SEEN     [facility]   Culture     Final   Value: NO GROWTH 3 DAYS     Performed at [facility]   Report Status 10/08/2013 FINAL   Final  BODY FLUID CULTURE     Status: None   Collection Time    10/05/13 12:00 PM      Result Value Ref Range Status   Specimen Description PELVIS   Final   Special Requests NONE   Final   Gram Stain     Final   Value: WBC PRESENT,BOTH PMN AND MONONUCLEAR     NO ORGANISMS SEEN   Performed at [facility]   Culture     Final   Value: NO GROWTH 3 DAYS     Performed at [facility]   Report Status 10/08/2013 FINAL   Final  MRSA PCR SCREENING     Status: None   Collection Time    10/08/13 11:02 AM      Result Value Ref Range Status   MRSA by PCR NEGATIVE  NEGATIVE Final   Comment:            The GeneXpert MRSA Assay (FDA     approved for NASAL specimens     only), is one component of a     comprehensive MRSA colonization     surveillance program. It is not     intended to diagnose MRSA     infection nor to guide or     monitor treatment for     MRSA infections.  CULTURE, ROUTINE-ABSCESS     Status: None   Collection Time    10/08/13  1:37 PM      Result Value Ref Range Status   Specimen Description ABSCESS INTRALOOP   Final   Special Requests NONE   Final   Gram Stain     Final   Value: RARE WBC PRESENT, PREDOMINANTLY MONONUCLEAR  NO SQUAMOUS EPITHELIAL CELLS SEEN     NO ORGANISMS SEEN     Performed at [facility]   Culture     Final   Value: MODERATE CANDIDA ALBICANS     Performed at [facility]   Report Status 10/11/2013 FINAL   Final  ANAEROBIC CULTURE     Status: None   Collection Time    10/08/13  1:37 PM      Result Value Ref Range Status   Specimen Description ABSCESS INTRALOOP   Final   Special Requests NONE   Final   Gram Stain     Final   Value: RARE WBC PRESENT, PREDOMINANTLY MONONUCLEAR     NO SQUAMOUS EPITHELIAL CELLS SEEN     NO ORGANISMS SEEN     Performed at [facility]   Culture     Final   Value: NO ANAEROBES ISOLATED     Performed at [facility]   Report Status 10/13/2013 FINAL   Final   Anti-infectives: 6/27 >> Flagyl >> 6/28 6/28 >> Primaxin >> 7/6 >> Fluconazole (MD) >> 7/6 >> Flagyl (MD) >> .  Assessment: 21 yoM with sigmoid diverticulitis with contained perforation admitted for bowel rest and IV antibiotics.  Pharmacy was consulted to dose Primaxin.  Went to  surgery 7/6 for laparoscopic drainage of abdominal abscess and two drains were placed. 6L of antibiotic solution (Clinda/Gent) was irrigated in the cavity. Fluconazole and Flagyl were added postoperatively.   7/13: D#16 Primaxin 500 mg IV q6h D#8   Flagyl 500 mg IV q6h [MD dosing] D#8   Fluconazole 200 mg IV q24h [MD dosing] Afebrile since 7/1 WBC wnl since 7/9 No new culture data Renal function stable   Goal of Therapy:  Appropriate antibiotic dosing for renal function; eradication of infection.   Plan:  1. Continue Primaxin 500 mg IV q6h. 2. Additional antimicrobials as ordered by MD 3. Follow serum creatinine, any additional cultures, clinical course. 4. Plans for impending discharge noted.  Bernadene Person, PharmD [phone] 10/15/2013 12:34 PM

## 2013-10-15 NOTE — Progress Notes (Signed)
Patient interviewed and examined, agree with PA note above. Since he is still having some degree of pain we will repeat his CT scan tomorrow prior to discharge.  Mariella SaaBenjamin T Joeanthony Seeling MD, FACS  10/15/2013 1:14 PM

## 2013-10-15 NOTE — Progress Notes (Signed)
NUTRITION FOLLOW UP  Intervention:   - Continue Soft diet - Add Ensure Complete po BID, each supplement provides 350 kcal and 13 grams of protein - Encouraged intake of diet and fluid. - RD to monitor plan of care   Nutrition Dx:   Inadequate oral intake related to clear liquid diet as evidenced by diet order - ongoing, now related to decreased appetite.  Goal:   1. Advance diet as tolerated to soft diet - met 2.  Intake of meals and supplements to meet >90% estimated needs.  Monitor:   Weights, labs, diet advancement  Assessment:   Pt presents with lower abdominal pain that started Friday. 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 He went to local hospital where he was started on cipro and flagyl. CT shows sigmoid diverticulitis with contained perforation and partial small bowel obstruction per MD notes.   6/29: -Pt discussed during multidisciplinary rounds.  -Met with pt who reports eating the "wrong" kind of foods PTA, 4-5 meals/day of mostly fast foods  -States his weight has been stable  -Denies any diarrhea since admission, last emesis was Friday, abdominal pain reportedly has remained the same since admission  -Appeared uncomfortable, did not perform nutrition focused physical exam  -Starting clear liquids today  7/6: -Had CT guided drain placement in pelvic fluid collection from right transgluteal approach 7/3 -Per surgeon's notes, pt had CT scan with interloop abscess w gas - much larger -Still with abdominal distention this morning per PA notes -Currently in OR for open exploration of abdomen with drainage of abscesses  7/13: -Tolerating soft diet with early satiety and decreased appetite. - Intake remains poor.  Ate 1/2 sandwich for lunch. - Patient reported to PA, increased pain with BM and urination and limiting intake secondary to pain.   Height: Ht Readings from Last 1 Encounters:  09/29/13 _0  (1.803 m)    Weight Status:   Wt Readings from Last 1  Encounters:  09/29/13 190 lb (86.183 kg)    Re-estimated needs:  Kcal: 1950-2150  Protein: 90-110g  Fluid: 1.9-2.1L/day   Skin: Intact    Diet Order: Criss Rosales   Intake/Output Summary (Last 24 hours) at 10/15/13 1545 Last data filed at 10/15/13 1257  Gross per 24 hour  Intake   1250 ml  Output    916 ml  Net    334 ml    Last BM: 7/4   Labs:   Recent Labs Lab 10/09/13 0455 10/11/13 0531 10/13/13 0601  NA 133* 133* 135*  K 4.8 4.1 3.9  CL 97 98 97  CO2 _1 BUN _2 CREATININE 0.98 0.97 0.93  CALCIUM 8.7 8.5 8.7  GLUCOSE 131* 106* 103*    CBG (last 3)  No results found for this basename: GLUCAP,  in the last 72 hours  Scheduled Meds: . enoxaparin (LOVENOX) injection  40 mg Subcutaneous Q24H  . fluconazole (DIFLUCAN) IV  200 mg Intravenous Q24H  . imipenem-cilastatin  500 mg Intravenous 4 times per day  . lip balm  1 application Topical BID  . metronidazole  500 mg Intravenous Q6H  . pantoprazole (PROTONIX) IV  40 mg Intravenous QHS  . sulfamethoxazole-trimethoprim  1 tablet Oral Q12H    Antonieta Iba, RD, LDN Clinical Inpatient Dietitian Pager:  [phone] Weekend and after hours pager:  [phone]

## 2013-10-16 ENCOUNTER — Encounter (HOSPITAL_COMMUNITY): Payer: Self-pay | Admitting: Radiology

## 2013-10-16 ENCOUNTER — Inpatient Hospital Stay (HOSPITAL_COMMUNITY): Payer: BC Managed Care – PPO

## 2013-10-16 LAB — CBC
HCT: 30.7 % — ABNORMAL LOW (ref 39.0–52.0)
Hemoglobin: 10.3 g/dL — ABNORMAL LOW (ref 13.0–17.0)
MCH: 30.6 pg (ref 26.0–34.0)
MCHC: 33.6 g/dL (ref 30.0–36.0)
MCV: 91.1 fL (ref 78.0–100.0)
Platelets: 694 10*3/uL — ABNORMAL HIGH (ref 150–400)
RBC: 3.37 MIL/uL — ABNORMAL LOW (ref 4.22–5.81)
RDW: 14.4 % (ref 11.5–15.5)
WBC: 10 10*3/uL (ref 4.0–10.5)

## 2013-10-16 LAB — COMPREHENSIVE METABOLIC PANEL
ALBUMIN: 2.6 g/dL — AB (ref 3.5–5.2)
ALT: 26 U/L (ref 0–53)
ANION GAP: 9 (ref 5–15)
AST: 32 U/L (ref 0–37)
Alkaline Phosphatase: 51 U/L (ref 39–117)
BUN: 9 mg/dL (ref 6–23)
CO2: 26 mEq/L (ref 19–32)
Calcium: 9 mg/dL (ref 8.4–10.5)
Chloride: 97 mEq/L (ref 96–112)
Creatinine, Ser: 1.04 mg/dL (ref 0.50–1.35)
GFR calc non Af Amer: 82 mL/min — ABNORMAL LOW (ref >90)
Glucose, Bld: 99 mg/dL (ref 70–99)
POTASSIUM: 4.4 meq/L (ref 3.7–5.3)
SODIUM: 132 meq/L — AB (ref 137–147)
TOTAL PROTEIN: 6.6 g/dL (ref 6.0–8.3)
Total Bilirubin: 0.7 mg/dL (ref 0.3–1.2)

## 2013-10-16 LAB — URINE CULTURE
CULTURE: NO GROWTH
Colony Count: NO GROWTH

## 2013-10-16 MED ORDER — IOHEXOL 300 MG/ML  SOLN
100.0000 mL | Freq: Once | INTRAMUSCULAR | Status: AC | PRN
  Administered 2013-10-16: 100 mL via INTRAVENOUS

## 2013-10-16 MED ORDER — IOHEXOL 300 MG/ML  SOLN
25.0000 mL | Freq: Once | INTRAMUSCULAR | Status: AC | PRN
  Administered 2013-10-16: 25 mL via ORAL

## 2013-10-16 NOTE — Progress Notes (Signed)
8 Days Post-Op  Subjective: Complains of pain with Drains, says 15 mg oxycodone isn't enough.  Otherwise seems to be doing better.  Labs ok and exam is not real impressive.  Drains are clear with only 15 ml from them yesterday.  Objective: Vital signs in last 24 hours: Temp:  [97.7 F (36.5 C)-98 F (36.7 C)] 97.7 F (36.5 C) (07/14 0523) Pulse Rate:  [73-98] 73 (07/14 0523) Resp:  [16-18] 18 (07/14 0523) BP: (120-137)/(78-87) 137/87 mmHg (07/14 0523) SpO2:  [95 %-98 %] 95 % (07/14 0523) Last BM Date: 10/15/13 PO not recorded yesterday Afebrile, VSS Labs OK, Intake/Output from previous day: 07/13 0701 - 07/14 0700 In: -  Out: 1040 [Urine:1025; Drains:15] Intake/Output this shift:    General appearance: alert, cooperative and no distress GI: soft, sore, complains of pain from drains, but not like what he had before.  Less pain with voiding and 1 BM yesterday. he said it didn't hurt as much.  Lab Results:   Recent Labs  10/15/13 0535 10/16/13 0532  WBC 8.6 10.0  HGB 10.6* 10.3*  HCT 31.2* 30.7*  PLT 867* 694*    BMET  Recent Labs  10/16/13 0532  NA 132*  K 4.4  CL 97  CO2 26  GLUCOSE 99  BUN 9  CREATININE 1.04  CALCIUM 9.0   PT/INR No results found for this basename: LABPROT, INR,  in the last 72 hours   Recent Labs Lab 10/16/13 0532  AST 32  ALT 26  ALKPHOS 51  BILITOT 0.7  PROT 6.6  ALBUMIN 2.6*     Lipase     Component Value Date/Time   LIPASE 12 09/29/2013 1848     Studies/Results: No results found.  Medications: . enoxaparin (LOVENOX) injection  40 mg Subcutaneous Q24H  . feeding supplement (ENSURE COMPLETE)  237 mL Oral BID BM  . fluconazole (DIFLUCAN) IV  200 mg Intravenous Q24H  . imipenem-cilastatin  500 mg Intravenous 4 times per day  . lip balm  1 application Topical BID  . metronidazole  500 mg Intravenous Q6H  . pantoprazole (PROTONIX) IV  40 mg Intravenous QHS  . sulfamethoxazole-trimethoprim  1 tablet Oral Q12H   Day  9 of fluconazole today,  Day 9 of Flagyl  Day 17 of Primaxin  Assessment/Plan 1. Recurrent Sigmoid diverticulitis with contained perforation admitted 09/29/13  2. New SB dilatation and possible pneumoperitoneum. CT scan show: rim enhancing 5.5 x 6.9 cm fluid collection in the pelvis,10/03/13; reflecting a developing abscess. IR drain placement; 10/05/13, Dr. Lowella DandyHenn  3. Sigmoid diverticulitis with interloop & pelvic abscesses; SBO: LAPAROSCOPIC DRAINAGE OF ABDOMINAL ABSCESS X2 with drain placement, LAPAROSCOPIC LYSIS OF ADHESIONS x 90min. 10/08/2013, Ardeth SportsmanSteven C. Gross, MD.  4. Pain control   Plan:  We are repeating his CT scan this AM.  I will check on pulling the drains also.  If everything looks good we hope to send him home later today.       LOS: 17 days    Randy Park 10/16/2013

## 2013-10-16 NOTE — Progress Notes (Signed)
Patient ID: Randy Park, male   DOB: 09/26/1960, 53 y.o.   MRN: [id number]   Asked to review 7/14 CT abd/pelvis of this pt for possible drain placement into new abscess Dr Lowella DandyHenn has reviewed films No safe window to gain access Cannot perform percutaneous abscess drain  [facility]

## 2013-10-16 NOTE — Progress Notes (Signed)
Patient interviewed and examined, agree with PA note above. Clinically he seems to be improving but the CT has returned showing a new abscess cavity with a large amount of air and some fluid approximately 8 x 10 cm in the pelvis. Previous interloop abscesses had resolved. This was discussed with interventional radiology and there is no window for percutaneous drainage. I had an extensive discussion with the patient regarding options which would include [facility] but of course excepting a temporary colostomy. The other option would be laparoscopic or open abscess drainage. No decision was made tonight. We are going to get a contrast enema and if there is extravasation I would think he would definitely need a colectomy. Will reevaluate in the morning.  Mariella SaaBenjamin T Jarin Cornfield MD, FACS  10/16/2013 6:50 PM

## 2013-10-17 ENCOUNTER — Inpatient Hospital Stay (HOSPITAL_COMMUNITY): Payer: BC Managed Care – PPO

## 2013-10-17 MED ORDER — IOHEXOL 300 MG/ML  SOLN
150.0000 mL | Freq: Once | INTRAMUSCULAR | Status: AC | PRN
  Administered 2013-10-17: 450 mL via INTRATHECAL

## 2013-10-17 NOTE — Progress Notes (Signed)
Patient interviewed and examined, agree with PA note above. His water-soluble contrast enema and has returned showing extravasation from the mid sigmoid colon. I discussed this finding with him and based on this I believe he would be best served [facility]. He understands and he is in complete agreement. We discussed the nature of the surgery including colostomy and expected recovery as well as risks of bleeding, infection, injury to ureter or surrounding structure and the fact that his wound would be left open. All his questions were answered and he is ready to proceed tomorrow.  Mariella SaaBenjamin T Yaslyn Cumby MD, FACS  10/17/2013 5:05 PM

## 2013-10-17 NOTE — Progress Notes (Signed)
9 Days Post-Op  Subjective: He is still tight and sore, no BM for 2 days.  He is very frustrated over the whole thing.  Contrast enema is pending.  Objective: Vital signs in last 24 hours: Temp:  [97.7 F (36.5 C)-98.7 F (37.1 C)] 97.7 F (36.5 C) (07/15 0542) Pulse Rate:  [87-88] 88 (07/15 0542) Resp:  [16-18] 18 (07/15 0542) BP: (109-128)/(66-82) 127/81 mmHg (07/15 0542) SpO2:  [97 %-98 %] 98 % (07/15 0542) Last BM Date: 10/15/13 Nothing on Intake, 450 urine last shift recorded.  Diet: clears Nothing from the drain Afebrile, VSS Labs OK CT yesterday:  Predominantly air-filled abscess posteriorly within the pelvis that measures 10.3 x 6.3 x 5.4 cm with small amount of fluid present with then.  Intake/Output from previous day: 07/14 0701 - 07/15 0700 In: -  Out: 450 [Urine:450] Intake/Output this shift:    General appearance: alert, cooperative, no distress and pretty stressed out over the whole thing. GI: still distended and tight sitting up.  No Bm for 2 days.  Lab Results:   Recent Labs  10/15/13 0535 10/16/13 0532  WBC 8.6 10.0  HGB 10.6* 10.3*  HCT 31.2* 30.7*  PLT 867* 694*    BMET  Recent Labs  10/16/13 0532  NA 132*  K 4.4  CL 97  CO2 26  GLUCOSE 99  BUN 9  CREATININE 1.04  CALCIUM 9.0   PT/INR No results found for this basename: LABPROT, INR,  in the last 72 hours   Recent Labs Lab 10/16/13 0532  AST 32  ALT 26  ALKPHOS 51  BILITOT 0.7  PROT 6.6  ALBUMIN 2.6*     Lipase     Component Value Date/Time   LIPASE 12 09/29/2013 1848     Studies/Results: Ct Abdomen Pelvis W Contrast  10/16/2013   CLINICAL DATA:  Diverticular abscess.  EXAM: CT ABDOMEN AND PELVIS WITH CONTRAST  TECHNIQUE: Multidetector CT imaging of the abdomen and pelvis was performed using the standard protocol following bolus administration of intravenous contrast.  CONTRAST:  25mL OMNIPAQUE IOHEXOL 300 MG/ML SOLN, OMNIPAQUE IOHEXOL 300 MG/ML SOLN  COMPARISON:   CT scan of October 08, 2013.  FINDINGS: Bilateral basilar opacities are noted concerning for pneumonia or subsegmental atelectasis. No significant osseous abnormality is noted.  No gallstones are noted. The liver, spleen and pancreas appear normal. Adrenal glands and kidneys appear normal. There has been interval placement of 2 surgical drains, with 1 entering the right upper quadrant of the abdomen and its tip in the left lower quadrant. The other enters the left upper quadrant with its tip in the left lower quadrant is well. There is noted predominantly air-filled abscess posteriorly within the pelvis that measures 10.3 x 6.3 x 5.4 cm with small amount of fluid present with then. Urinary bladder appears normal. Percutaneous drainage catheter noted in posterior portion of pelvis on prior exam has been removed. Residual fluid collection remains in this area measuring 34 x 16 mm. No definite evidence of bowel obstruction is noted. Stool is noted in the rectum. Sigmoid diverticulosis is noted with mild wall thickening which may represent some degree of acute inflammation.  IMPRESSION: Mild bilateral posterior basilar opacities are noted concerning for subsegmental atelectasis or possibly pneumonia.  Interval placement of 2 surgical drains are noted as described above. There is continued presence of large predominantly air-filled abscess extending from the posterior and central portion of the abdomen into the pelvis, which contains a small amount of fluid.  This abnormality does appear to be slightly enlarged compared to prior exam.  Percutaneous drainage catheter noted on prior exam has been removed, with small residual fluid collection remaining in the posterior portion of the pelvis.   Electronically Signed   By: Roque LiasJames  Green M.D.   On: 10/16/2013 10:08    Medications: . enoxaparin (LOVENOX) injection  40 mg Subcutaneous Q24H  . feeding supplement (ENSURE COMPLETE)  237 mL Oral BID BM  . fluconazole (DIFLUCAN) IV   200 mg Intravenous Q24H  . imipenem-cilastatin  500 mg Intravenous 4 times per day  . lip balm  1 application Topical BID  . metronidazole  500 mg Intravenous Q6H  . pantoprazole (PROTONIX) IV  40 mg Intravenous QHS  . sulfamethoxazole-trimethoprim  1 tablet Oral Q12H    Assessment/Plan 1. Recurrent Sigmoid diverticulitis with contained perforation admitted 09/29/13  2. New SB dilatation and possible pneumoperitoneum. CT scan show: rim enhancing 5.5 x 6.9 cm fluid collection in the pelvis,10/03/13; reflecting a developing abscess. IR drain placement; 10/05/13, Dr. Lowella DandyHenn  3. Sigmoid diverticulitis with interloop & pelvic abscesses; SBO: LAPAROSCOPIC DRAINAGE OF ABDOMINAL ABSCESS X2 with drain placement, LAPAROSCOPIC LYSIS OF ADHESIONS x 90min. 10/08/2013, Ardeth SportsmanSteven C. Gross, MD.  4.  CT scan 10/16/13 shows a new, 8 x 10 cm air/fluid abscess cavity, prior interloop abscess resolved. Not amenable to IR drain. 4. Pain control    Plan:  Contrast enema today and Dr. Johna SheriffHoxworth will plan course after study results are back. Urine culture is negative so I have stopped Bactrim.  LOS: 18 days    Danaye Sobh 10/17/2013

## 2013-10-18 ENCOUNTER — Encounter (HOSPITAL_COMMUNITY): Payer: BC Managed Care – PPO | Admitting: Registered Nurse

## 2013-10-18 ENCOUNTER — Inpatient Hospital Stay (HOSPITAL_COMMUNITY): Payer: BC Managed Care – PPO | Admitting: Registered Nurse

## 2013-10-18 ENCOUNTER — Encounter (HOSPITAL_COMMUNITY): Payer: Self-pay | Admitting: Registered Nurse

## 2013-10-18 ENCOUNTER — Encounter (HOSPITAL_COMMUNITY): Admission: EM | Disposition: A | Discharge status: Home Health | Payer: Self-pay | Source: Home / Self Care

## 2013-10-18 DIAGNOSIS — K630 Abscess of intestine: Secondary | ICD-10-CM

## 2013-10-18 DIAGNOSIS — K5732 Diverticulitis of large intestine without perforation or abscess without bleeding: Secondary | ICD-10-CM

## 2013-10-18 HISTORY — PX: COLON RESECTION: SHX5231

## 2013-10-18 HISTORY — PX: COLOSTOMY: SHX63

## 2013-10-18 SURGERY — COLON RESECTION
Anesthesia: General | Site: Abdomen

## 2013-10-18 MED ORDER — GLYCOPYRROLATE 0.2 MG/ML IJ SOLN
INTRAMUSCULAR | Status: AC
  Filled 2013-10-18: qty 3

## 2013-10-18 MED ORDER — SUFENTANIL CITRATE 50 MCG/ML IV SOLN
INTRAVENOUS | Status: DC | PRN
  Administered 2013-10-18 (×2): 10 ug via INTRAVENOUS
  Administered 2013-10-18: 5 ug via INTRAVENOUS
  Administered 2013-10-18 (×3): 10 ug via INTRAVENOUS
  Administered 2013-10-18 (×5): 5 ug via INTRAVENOUS
  Administered 2013-10-18 (×2): 10 ug via INTRAVENOUS

## 2013-10-18 MED ORDER — LACTATED RINGERS IV SOLN: INTRAVENOUS | Status: DC

## 2013-10-18 MED ORDER — SUFENTANIL CITRATE 50 MCG/ML IV SOLN
INTRAVENOUS | Status: AC
  Filled 2013-10-18: qty 1

## 2013-10-18 MED ORDER — PROPOFOL 10 MG/ML IV BOLUS
INTRAVENOUS | Status: AC
  Filled 2013-10-18: qty 20

## 2013-10-18 MED ORDER — LACTATED RINGERS IV SOLN
INTRAVENOUS | Status: DC | PRN
  Administered 2013-10-18 (×4): via INTRAVENOUS

## 2013-10-18 MED ORDER — PROPOFOL 10 MG/ML IV BOLUS
INTRAVENOUS | Status: DC | PRN
  Administered 2013-10-18: 160 mg via INTRAVENOUS

## 2013-10-18 MED ORDER — ROCURONIUM BROMIDE 100 MG/10ML IV SOLN
INTRAVENOUS | Status: DC | PRN
  Administered 2013-10-18: 5 mg via INTRAVENOUS
  Administered 2013-10-18 (×3): 10 mg via INTRAVENOUS
  Administered 2013-10-18: 45 mg via INTRAVENOUS
  Administered 2013-10-18: 10 mg via INTRAVENOUS

## 2013-10-18 MED ORDER — ACETAMINOPHEN 10 MG/ML IV SOLN
INTRAVENOUS | Status: DC | PRN
  Administered 2013-10-18: 80 mg via INTRAVENOUS

## 2013-10-18 MED ORDER — SODIUM CHLORIDE 0.9 % IJ SOLN
INTRAMUSCULAR | Status: AC
  Filled 2013-10-18: qty 10

## 2013-10-18 MED ORDER — HYDROMORPHONE HCL PF 1 MG/ML IJ SOLN
INTRAMUSCULAR | Status: AC
  Filled 2013-10-18: qty 1

## 2013-10-18 MED ORDER — BUPIVACAINE-EPINEPHRINE (PF) 0.25% -1:200000 IJ SOLN
INTRAMUSCULAR | Status: AC
  Filled 2013-10-18: qty 30

## 2013-10-18 MED ORDER — ONDANSETRON HCL 4 MG/2ML IJ SOLN
INTRAMUSCULAR | Status: AC
  Filled 2013-10-18: qty 2

## 2013-10-18 MED ORDER — NEOSTIGMINE METHYLSULFATE 10 MG/10ML IV SOLN
INTRAVENOUS | Status: AC
  Filled 2013-10-18: qty 1

## 2013-10-18 MED ORDER — ENOXAPARIN SODIUM 40 MG/0.4ML ~~LOC~~ SOLN
40.0000 mg | SUBCUTANEOUS | Status: DC
  Administered 2013-10-19 – 2013-10-27 (×8): 40 mg via SUBCUTANEOUS
  Filled 2013-10-18 (×10): qty 0.4

## 2013-10-18 MED ORDER — HYDROMORPHONE HCL PF 2 MG/ML IJ SOLN
INTRAMUSCULAR | Status: AC
  Filled 2013-10-18: qty 1

## 2013-10-18 MED ORDER — DEXAMETHASONE SODIUM PHOSPHATE 10 MG/ML IJ SOLN
INTRAMUSCULAR | Status: AC
  Filled 2013-10-18: qty 1

## 2013-10-18 MED ORDER — LIDOCAINE HCL (CARDIAC) 20 MG/ML IV SOLN
INTRAVENOUS | Status: DC | PRN
  Administered 2013-10-18: 25 mg via INTRATRACHEAL
  Administered 2013-10-18: 75 mg via INTRAVENOUS

## 2013-10-18 MED ORDER — ONDANSETRON HCL 4 MG/2ML IJ SOLN
INTRAMUSCULAR | Status: DC | PRN
  Administered 2013-10-18: 4 mg via INTRAVENOUS

## 2013-10-18 MED ORDER — MIDAZOLAM HCL 2 MG/2ML IJ SOLN
INTRAMUSCULAR | Status: AC
  Filled 2013-10-18: qty 2

## 2013-10-18 MED ORDER — SUCCINYLCHOLINE CHLORIDE 20 MG/ML IJ SOLN
INTRAMUSCULAR | Status: DC | PRN
  Administered 2013-10-18: 100 mg via INTRAVENOUS

## 2013-10-18 MED ORDER — MIDAZOLAM HCL 5 MG/5ML IJ SOLN
INTRAMUSCULAR | Status: DC | PRN
  Administered 2013-10-18: 2 mg via INTRAVENOUS

## 2013-10-18 MED ORDER — PHENYLEPHRINE HCL 10 MG/ML IJ SOLN
INTRAMUSCULAR | Status: DC | PRN
  Administered 2013-10-18 (×3): 80 ug via INTRAVENOUS

## 2013-10-18 MED ORDER — DEXAMETHASONE SODIUM PHOSPHATE 10 MG/ML IJ SOLN
INTRAMUSCULAR | Status: DC | PRN
  Administered 2013-10-18: 10 mg via INTRAVENOUS

## 2013-10-18 MED ORDER — KCL IN DEXTROSE-NACL 20-5-0.9 MEQ/L-%-% IV SOLN
INTRAVENOUS | Status: AC
  Administered 2013-10-18 – 2013-10-19 (×3): via INTRAVENOUS
  Filled 2013-10-18 (×4): qty 1000

## 2013-10-18 MED ORDER — LIDOCAINE HCL (CARDIAC) 20 MG/ML IV SOLN
INTRAVENOUS | Status: AC
  Filled 2013-10-18: qty 5

## 2013-10-18 MED ORDER — HYDROMORPHONE HCL PF 1 MG/ML IJ SOLN
0.2500 mg | INTRAMUSCULAR | Status: DC | PRN
  Administered 2013-10-18 (×4): 0.5 mg via INTRAVENOUS

## 2013-10-18 MED ORDER — HYDROMORPHONE HCL PF 1 MG/ML IJ SOLN
1.0000 mg | INTRAMUSCULAR | Status: DC | PRN
  Administered 2013-10-18: 3 mg via INTRAVENOUS
  Administered 2013-10-18: 1 mg via INTRAVENOUS
  Administered 2013-10-18: 3 mg via INTRAVENOUS
  Administered 2013-10-18 (×2): 2 mg via INTRAVENOUS
  Administered 2013-10-19 – 2013-10-20 (×17): 3 mg via INTRAVENOUS
  Filled 2013-10-18 (×10): qty 3
  Filled 2013-10-18: qty 2
  Filled 2013-10-18 (×3): qty 3
  Filled 2013-10-18: qty 2
  Filled 2013-10-18 (×2): qty 3
  Filled 2013-10-18: qty 2
  Filled 2013-10-18 (×4): qty 3

## 2013-10-18 MED ORDER — LACTATED RINGERS IV SOLN
INTRAVENOUS | Status: DC
  Administered 2013-10-18: 1000 mL via INTRAVENOUS

## 2013-10-18 MED ORDER — LABETALOL HCL 5 MG/ML IV SOLN
INTRAVENOUS | Status: DC | PRN
  Administered 2013-10-18: 5 mg via INTRAVENOUS
  Administered 2013-10-18: 2.5 mg via INTRAVENOUS

## 2013-10-18 SURGICAL SUPPLY — 64 items
APPLICATOR COTTON TIP 6IN STRL (MISCELLANEOUS) ×4 IMPLANT
BLADE EXTENDED COATED 6.5IN (ELECTRODE) ×1 IMPLANT
BLADE HEX COATED 2.75 (ELECTRODE) ×2 IMPLANT
BLADE SURG SZ10 CARB STEEL (BLADE) ×2 IMPLANT
BRR ADH 6X5 SEPRAFILM 1 SHT (MISCELLANEOUS) ×1
CANISTER SUCTION 2500CC (MISCELLANEOUS) ×2 IMPLANT
CLIP TI LARGE 6 (CLIP) IMPLANT
COVER MAYO STAND STRL (DRAPES) ×2 IMPLANT
DRAPE LAPAROSCOPIC ABDOMINAL (DRAPES) ×2 IMPLANT
DRAPE LG THREE QUARTER DISP (DRAPES) ×1 IMPLANT
DRAPE WARM FLUID 44X44 (DRAPE) ×2 IMPLANT
DRSG PAD ABDOMINAL 8X10 ST (GAUZE/BANDAGES/DRESSINGS) ×1 IMPLANT
ELECT BLADE TIP CTD 4 INCH (ELECTRODE) ×1 IMPLANT
ELECT REM PT RETURN 9FT ADLT (ELECTROSURGICAL) ×2
ELECTRODE REM PT RTRN 9FT ADLT (ELECTROSURGICAL) ×1 IMPLANT
GAUZE SPONGE 4X4 12PLY STRL (GAUZE/BANDAGES/DRESSINGS) ×2 IMPLANT
GLOVE BIOGEL PI IND STRL 7.0 (GLOVE) ×1 IMPLANT
GLOVE BIOGEL PI IND STRL 7.5 (GLOVE) ×1 IMPLANT
GLOVE BIOGEL PI INDICATOR 7.0 (GLOVE) ×4
GLOVE BIOGEL PI INDICATOR 7.5 (GLOVE) ×2
GLOVE SS BIOGEL STRL SZ 7.5 (GLOVE) ×2 IMPLANT
GLOVE SUPERSENSE BIOGEL SZ 7.5 (GLOVE) ×2
GOWN STRL REUS W/TWL LRG LVL3 (GOWN DISPOSABLE) ×1 IMPLANT
GOWN STRL REUS W/TWL XL LVL3 (GOWN DISPOSABLE) ×6 IMPLANT
KIT BASIN OR (CUSTOM PROCEDURE TRAY) ×2 IMPLANT
LEGGING LITHOTOMY PAIR STRL (DRAPES) ×1 IMPLANT
LIGASURE IMPACT 36 18CM CVD LR (INSTRUMENTS) ×1 IMPLANT
NS IRRIG 1000ML POUR BTL (IV SOLUTION) ×4 IMPLANT
PACK GENERAL/GYN (CUSTOM PROCEDURE TRAY) ×2 IMPLANT
SEALER TISSUE G2 CVD JAW 35 (ENDOMECHANICALS) IMPLANT
SEALER TISSUE G2 CVD JAW 45CM (ENDOMECHANICALS)
SEPRAFILM MEMBRANE 5X6 (MISCELLANEOUS) ×1 IMPLANT
SHEARS HARMONIC ACE PLUS 36CM (ENDOMECHANICALS) IMPLANT
SPONGE GAUZE 4X4 12PLY (GAUZE/BANDAGES/DRESSINGS) ×1 IMPLANT
STAPLER CUT CVD 40MM BLUE (STAPLE) ×1 IMPLANT
STAPLER PROXIMATE 75MM BLUE (STAPLE) ×1 IMPLANT
STAPLER VISISTAT 35W (STAPLE) ×2 IMPLANT
SUCTION POOLE TIP (SUCTIONS) ×2 IMPLANT
SUT ETHILON 2 0 PS N (SUTURE) ×1 IMPLANT
SUT NOV 1 T60/GS (SUTURE) IMPLANT
SUT NOVA 1 T20/GS 25DT (SUTURE) IMPLANT
SUT NOVA NAB DX-16 0-1 5-0 T12 (SUTURE) ×1 IMPLANT
SUT NOVA T20/GS 25 (SUTURE) ×4 IMPLANT
SUT PDS AB 1 CTX 36 (SUTURE) ×2 IMPLANT
SUT PDS AB 1 TP1 54 (SUTURE) ×2 IMPLANT
SUT PDS AB 1 TP1 96 (SUTURE) ×2 IMPLANT
SUT PROLENE 0 SH 30 (SUTURE) ×1 IMPLANT
SUT PROLENE 2 0 KS (SUTURE) IMPLANT
SUT SILK 2 0 (SUTURE) ×4
SUT SILK 2 0 SH CR/8 (SUTURE) ×2 IMPLANT
SUT SILK 2 0SH CR/8 30 (SUTURE) IMPLANT
SUT SILK 2-0 18XBRD TIE 12 (SUTURE) ×1 IMPLANT
SUT SILK 2-0 30XBRD TIE 12 (SUTURE) IMPLANT
SUT SILK 3 0 (SUTURE) ×4
SUT SILK 3 0 SH CR/8 (SUTURE) ×2 IMPLANT
SUT SILK 3-0 18XBRD TIE 12 (SUTURE) ×2 IMPLANT
SUT VIC AB 3-0 54XBRD REEL (SUTURE) IMPLANT
SUT VIC AB 3-0 BRD 54 (SUTURE)
SUT VIC AB 3-0 SH 18 (SUTURE) ×2 IMPLANT
TAPE CLOTH SURG 6X10 WHT LF (GAUZE/BANDAGES/DRESSINGS) ×1 IMPLANT
TOWEL OR 17X26 10 PK STRL BLUE (TOWEL DISPOSABLE) ×4 IMPLANT
TRAY FOLEY CATH 14FRSI W/METER (CATHETERS) ×2 IMPLANT
WATER STERILE IRR 1500ML POUR (IV SOLUTION) IMPLANT
YANKAUER SUCT BULB TIP NO VENT (SUCTIONS) ×2 IMPLANT

## 2013-10-18 NOTE — Transfer of Care (Signed)
Immediate Anesthesia Transfer of Care Note  Patient: Randy Park  Procedure(s) Performed: Procedure(s): COLON RESECTION (N/A) HARTMANNS PROCEDURE, COLOSTOMY (N/A)  Patient Location: PACU  Anesthesia Type:General  Level of Consciousness: awake, alert  and oriented  Airway & Oxygen Therapy: Patient Spontanous Breathing and Patient connected to face mask oxygen  Post-op Assessment: Report given to PACU RN and Post -op Vital signs reviewed and stable  Post vital signs: Reviewed and stable  Complications: No apparent anesthesia complications 

## 2013-10-18 NOTE — Anesthesia Preprocedure Evaluation (Addendum)
Anesthesia Evaluation  Patient identified by MRN, date of birth, ID band Patient awake    Reviewed: Allergy & Precautions, H&P , NPO status , Patient's Chart, lab work & pertinent test results  Airway Mallampati: II TM Distance: >3 FB Neck ROM: Full    Dental no notable dental hx. (+) Teeth Intact, Dental Advisory Given   Pulmonary neg pulmonary ROS, Current Smoker,  breath sounds clear to auscultation  Pulmonary exam normal       Cardiovascular Exercise Tolerance: Good negative cardio ROS  Rhythm:Regular Rate:Normal     Neuro/Psych negative neurological ROS  negative psych ROS   GI/Hepatic negative GI ROS, Neg liver ROS, hiatal hernia, GERD-  Medicated and Controlled,  Endo/Other  negative endocrine ROS  Renal/GU negative Renal ROS  negative genitourinary   Musculoskeletal negative musculoskeletal ROS (+)   Abdominal   Peds negative pediatric ROS (+)  Hematology negative hematology ROS (+) anemia , hgb 10.3   Anesthesia Other Findings   Reproductive/Obstetrics negative OB ROS                          Anesthesia Physical  Anesthesia Plan  ASA: II  Anesthesia Plan: General   Post-op Pain Management:    Induction: Intravenous  Airway Management Planned: Oral ETT  Additional Equipment:   Intra-op Plan:   Post-operative Plan: Extubation in OR  Informed Consent: I have reviewed the patients History and Physical, chart, labs and discussed the procedure including the risks, benefits and alternatives for the proposed anesthesia with the patient or authorized representative who has indicated his/her understanding and acceptance.   Dental advisory given  Plan Discussed with: CRNA and Surgeon  Anesthesia Plan Comments:         Anesthesia Quick Evaluation

## 2013-10-18 NOTE — Op Note (Signed)
Preoperative Diagnosis: Diverticulitis of colon with perforation [562.11] Partial small bowel obstruction [560.9]  Postoprative Diagnosis: Diverticulitis of colon with perforation [562.11] Partial small bowel obstruction [560.9]  Procedure: Procedure(s): COLON RESECTION HARTMANNS PROCEDURE, COLOSTOMY   Surgeon: Glenna Fellows T   Assistants: Avel Peace  Anesthesia:  General endotracheal anesthesia and Epidural anesthesia  Indications: patient is a 53 year old male who presented approximately 3 weeks ago with a pelvic abscess from perforated diverticulitis. He initially underwent percutaneous drainage and improved but subsequently developed interloop abdominal abscesses and approximately 10 days ago underwent laparoscopy with lysis of adhesions and drainage of intra-abdominal abscesses with irrigation and examination of the sigmoid colon without apparent ongoing leak. The patient initially improved but has had some increased pain on followup CT scan he has a new large air and fluid-filled pelvic abscess. I obtained a Gastrografin enema which has shown extravasation into the abscess cavity. I recommend proceeding with [facility] colectomy. We discussed the indications and into the surgery and risks in detail after documented elsewhere. He is in agreement.  Procedure Detail:  Patient was brought to the operating room, placed in supine position on the operating table, and general endotracheal anesthesia induced. Foley catheter was placed. Nasogastric tube was placed. The abdomen was widely sterilely prepped and draped. He was already on broad-spectrum IV antibiotics. PAS were in place. Patient timeout was performed the correct procedure verified. A low midline incision skirting a blockage was used to dissect carried down to the midline fascia which was incised with cautery and the peritoneum entered under direct vision. There were numerous inflammatory small bowel adhesions to other loops of  small bowel and down into the pelvis. These were completely lysed mostly with careful blunt dissection and finger fracture and as I dissected down into the pelvis as I raised a loop of small bowel up a large air-filled cavity with stool and pus was entered. This was completely evacuated. The small bowel was mobilized so that it could be packed up into the upper abdomen. There was a very firm indurated segment of sigmoid colon adjacent to the abscess cavity in the right pelvis. The colon just proximal to this process where it became relatively soft it was isolated and mobilized dividing lateral peritoneal attachments and was divided with the GIA stapler. Dissection was then carried distally along the sigmoid colon mobilized away from the pelvic sidewall and to the mesentery with the LigaSure device staying right on the bowel and up out of where we felt the ureter was. We did identify the ureter just proximal to this and tracking well lateral in the retroperitoneum away from our dissection but did not expose it here as this tissue was very indurated. We came across an area of mesentery that was essentially fat necrosis and just fractured away and then continued distally with the LigaSure into we were just beyond the inflammatory process into relatively soft normal rectosigmoid. This was divided with the contour stapler and the specimen removed. I examined the specimen and did not see definitely the perforation that the bowel proximal and distal this was soft and we felt certainly we had the process removed. The abdomen was thoroughly irrigated with saline. We then carefully ran the small intestine a couple of times along its length and there were numerous areas of inflammatory adhesions and granulation tissue where the small bowel had walled off the pelvic abscess, but no evidence of small bowel injury. There was some relative obstruction with more dilated proximal bowel prior to the severe  adhesions. At the  completion all the small bowel was completely free. The abdomen was thoroughly irrigated with liters of warm saline. The rectal stump was marked with 2 Prolene sutures. A closed suction 19 Blake drain was brought out through a new right lower quadrant stab wound and left in the pelvic abscess cavity. The left colon was mobilized further to allow an end ostomy to be brought out without tension. The viscera were returned to anatomic position. The site for the stoma was chosen in the left lower quadrant and a skin button and subcutaneous tissue excised. The anterior fascia was incised in a cruciate fashion. The rectus muscle was bluntly split and the peritoneum opened and dilated to 2 fingers. The ostomy was brought out with no tension and  very good blood supply.  Seprafilm was applied over the loops of small bowel beneath the incision. The midline fascia was closed with looped running #1 PDS begun at either end of the incision and tied centrally along with intermittent interrupted #1 Novafil sutures. The ostomy was then matured and everted full-thickness to the skin. The midline wound was packed up with moist saline gauze an ostomy device applied. Sponge needle and instrument counts were correct.    Findings: As above  Estimated Blood Loss:  200 mL         Drains: 19 round Blake in the pelvis  Blood Given: none          Specimens: sigmoid colon        Complications:  * No complications entered in OR log *         Disposition: PACU - hemodynamically stable.         Condition: stable

## 2013-10-18 NOTE — Transfer of Care (Signed)
Immediate Anesthesia Transfer of Care Note  Patient: Randy Park  Procedure(s) Performed: Procedure(s): COLON RESECTION (N/A) HARTMANNS PROCEDURE, COLOSTOMY (N/A)  Patient Location: PACU  Anesthesia Type:General  Level of Consciousness: awake, alert  and oriented  Airway & Oxygen Therapy: Patient Spontanous Breathing and Patient connected to face mask oxygen  Post-op Assessment: Report given to PACU RN and Post -op Vital signs reviewed and stable  Post vital signs: Reviewed and stable  Complications: No apparent anesthesia complications

## 2013-10-18 NOTE — Anesthesia Postprocedure Evaluation (Signed)
  Anesthesia Post-op Note  Patient: Randy Park  Procedure(s) Performed: Procedure(s) (LRB): COLON RESECTION (N/A) HARTMANNS PROCEDURE, COLOSTOMY (N/A)  Patient Location: PACU  Anesthesia Type: General  Level of Consciousness: awake and alert   Airway and Oxygen Therapy: Patient Spontanous Breathing  Post-op Pain: mild  Post-op Assessment: Post-op Vital signs reviewed, Patient's Cardiovascular Status Stable, Respiratory Function Stable, Patent Airway and No signs of Nausea or vomiting  Last Vitals:  Filed Vitals:   10/18/13 1524  BP: 161/87  Pulse: 101  Temp: 36.9 C  Resp: 22    Post-op Vital Signs: stable   Complications: No apparent anesthesia complications

## 2013-10-19 ENCOUNTER — Encounter (HOSPITAL_COMMUNITY): Payer: Self-pay | Admitting: General Surgery

## 2013-10-19 DIAGNOSIS — E46 Unspecified protein-calorie malnutrition: Secondary | ICD-10-CM

## 2013-10-19 LAB — CBC
HCT: 31.9 % — ABNORMAL LOW (ref 39.0–52.0)
Hemoglobin: 10.5 g/dL — ABNORMAL LOW (ref 13.0–17.0)
MCH: 30.2 pg (ref 26.0–34.0)
MCHC: 32.9 g/dL (ref 30.0–36.0)
MCV: 91.7 fL (ref 78.0–100.0)
Platelets: 776 10*3/uL — ABNORMAL HIGH (ref 150–400)
RBC: 3.48 MIL/uL — ABNORMAL LOW (ref 4.22–5.81)
RDW: 14.4 % (ref 11.5–15.5)
WBC: 14.7 10*3/uL — ABNORMAL HIGH (ref 4.0–10.5)

## 2013-10-19 LAB — COMPREHENSIVE METABOLIC PANEL
ALBUMIN: 2.6 g/dL — AB (ref 3.5–5.2)
ALT: 17 U/L (ref 0–53)
ANION GAP: 12 (ref 5–15)
AST: 19 U/L (ref 0–37)
Alkaline Phosphatase: 48 U/L (ref 39–117)
BUN: 9 mg/dL (ref 6–23)
CO2: 24 mEq/L (ref 19–32)
Calcium: 8.8 mg/dL (ref 8.4–10.5)
Chloride: 92 mEq/L — ABNORMAL LOW (ref 96–112)
Creatinine, Ser: 0.86 mg/dL (ref 0.50–1.35)
GFR calc Af Amer: >90 mL/min (ref >90)
GFR calc non Af Amer: >90 mL/min (ref >90)
Glucose, Bld: 154 mg/dL — ABNORMAL HIGH (ref 70–99)
Potassium: 5.2 mEq/L (ref 3.7–5.3)
Sodium: 128 mEq/L — ABNORMAL LOW (ref 137–147)
TOTAL PROTEIN: 6.9 g/dL (ref 6.0–8.3)
Total Bilirubin: 0.5 mg/dL (ref 0.3–1.2)

## 2013-10-19 LAB — DIFFERENTIAL
Basophils Absolute: 0 10*3/uL (ref 0.0–0.1)
Basophils Relative: 0 % (ref 0–1)
EOS PCT: 0 % (ref 0–5)
Eosinophils Absolute: 0 10*3/uL (ref 0.0–0.7)
Lymphocytes Relative: 6 % — ABNORMAL LOW (ref 12–46)
Lymphs Abs: 0.9 10*3/uL (ref 0.7–4.0)
Monocytes Absolute: 1.8 10*3/uL — ABNORMAL HIGH (ref 0.1–1.0)
Monocytes Relative: 12 % (ref 3–12)
Neutro Abs: 12 10*3/uL — ABNORMAL HIGH (ref 1.7–7.7)
Neutrophils Relative %: 82 % — ABNORMAL HIGH (ref 43–77)

## 2013-10-19 LAB — TRIGLYCERIDES: Triglycerides: 43 mg/dL (ref <150)

## 2013-10-19 LAB — MAGNESIUM: Magnesium: 2.1 mg/dL (ref 1.5–2.5)

## 2013-10-19 LAB — PREALBUMIN: Prealbumin: 10.8 mg/dL — ABNORMAL LOW (ref 17.0–34.0)

## 2013-10-19 LAB — PHOSPHORUS: Phosphorus: 3.2 mg/dL (ref 2.3–4.6)

## 2013-10-19 MED ORDER — SODIUM CHLORIDE 0.9 % IJ SOLN
10.0000 mL | INTRAMUSCULAR | Status: DC | PRN
  Administered 2013-10-21 – 2013-10-26 (×4): 10 mL

## 2013-10-19 MED ORDER — TRACE MINERALS CR-CU-F-FE-I-MN-MO-SE-ZN IV SOLN
INTRAVENOUS | Status: AC
  Administered 2013-10-19: 18:00:00 via INTRAVENOUS
  Filled 2013-10-19: qty 2000

## 2013-10-19 MED ORDER — FAT EMULSION 20 % IV EMUL
250.0000 mL | INTRAVENOUS | Status: AC
  Administered 2013-10-19: 250 mL via INTRAVENOUS
  Filled 2013-10-19: qty 250

## 2013-10-19 MED ORDER — INSULIN ASPART 100 UNIT/ML ~~LOC~~ SOLN
0.0000 [IU] | Freq: Four times a day (QID) | SUBCUTANEOUS | Status: DC
  Administered 2013-10-20 (×4): 1 [IU] via SUBCUTANEOUS
  Administered 2013-10-22: 19:00:00 via SUBCUTANEOUS
  Administered 2013-10-22 – 2013-10-25 (×10): 1 [IU] via SUBCUTANEOUS
  Administered 2013-10-25: 2 [IU] via SUBCUTANEOUS

## 2013-10-19 MED ORDER — KCL IN DEXTROSE-NACL 20-5-0.9 MEQ/L-%-% IV SOLN
INTRAVENOUS | Status: AC
  Administered 2013-10-20: via INTRAVENOUS
  Filled 2013-10-19 (×2): qty 1000

## 2013-10-19 NOTE — Consult Note (Signed)
WOC ostomy consult note Stoma type/location: LMQ Colostomy Stomal assessment/size: 1 and 3/8 inches round, moist and edematous Peristomal assessment: intact, clear Treatment options for stomal/peristomal skin: skin barrier ring Output small amount of light yellow mucous Ostomy pouching: 2pc. 2 and 1/4 inch pouching system with skin barrier ring Education provided: Patient taught stoma characteristics and pouch characteristics.  Is falling asleep during teaching as he has recently been medicated for pain (in anticipation of midline dressing change).  Demonstration of stoma sizing, pouch preparation, pouching system application and Lock and Roll closure. Patient registered with [employer] today. WOC nursing team will follow, and will remain available to this patient, the nursing, surgical and medical teams.   Thanks, Ladona MowLaurie Eddy Liszewski, MSN, RN, GNP, LynnWOCN, CWON-AP [phone])

## 2013-10-19 NOTE — Progress Notes (Signed)
UR COMPLETED  

## 2013-10-19 NOTE — Progress Notes (Signed)
[facility] Progress Note  1 Day Post-Op  Subjective: Pt feels and looks great today.  He c/o incisional pain and stiffness, but feels overall much improved today.  He has a smile on his face for the first time in a while.  No N/V, NG tube aggravating.  Using Cepacol and throat spray.  Objective: Vital signs in last 24 hours: Temp:  [98 F (36.7 C)-99.3 F (37.4 C)] 98 F (36.7 C) (07/17 0544) Pulse Rate:  [92-106] 92 (07/17 0544) Resp:  [16-22] 18 (07/17 0544) BP: (137-177)/(87-110) 155/92 mmHg (07/17 0544) SpO2:  [98 %-100 %] 99 % (07/17 0544) Weight:  [190 lb 0.6 oz (86.2 kg)] 190 lb 0.6 oz (86.2 kg) (07/16 1355) Last BM Date: 10/18/13  Intake/Output from previous day: 07/16 0701 - 07/17 0700 In: 4705 [I.V.:4675; NG/GT:30] Out: 2970 [Urine:2300; Emesis/NG output:500; Drains:20; Blood:150] Intake/Output this shift:    PE: Gen:  Alert, NAD, pleasant Card:  RRR, no M/G/R heard Pulm:  CTA, no W/R/R Abd: Soft, mild distension, tender over midline abdomen and ostomy, diminished BS, no HSM, midline wound clean and pink, ostomy pink with some gas in bag, no BM yet.  NG output 553mL/24hr.   Lab Results:   Recent Labs  10/19/13 0532  WBC 14.7*  HGB 10.5*  HCT 31.9*  PLT 776*   BMET  Recent Labs  10/19/13 0532  NA 128*  K 5.2  CL 92*  CO2 24  GLUCOSE 154*  BUN 9  CREATININE 0.86  CALCIUM 8.8   PT/INR No results found for this basename: LABPROT, INR,  in the last 72 hours CMP     Component Value Date/Time   NA 128* 10/19/2013 0532   K 5.2 10/19/2013 0532   CL 92* 10/19/2013 0532   CO2 24 10/19/2013 0532   GLUCOSE 154* 10/19/2013 0532   BUN 9 10/19/2013 0532   CREATININE 0.86 10/19/2013 0532   CALCIUM 8.8 10/19/2013 0532   PROT 6.9 10/19/2013 0532   ALBUMIN 2.6* 10/19/2013 0532   AST 19 10/19/2013 0532   ALT 17 10/19/2013 0532   ALKPHOS 48 10/19/2013 0532   BILITOT 0.5 10/19/2013 0532   GFRNONAA >90 10/19/2013 0532   GFRAA >90 10/19/2013 0532    Lipase     Component Value Date/Time   LIPASE 12 09/29/2013 1848       Studies/Results: Dg Colon W/water Sol Cm  10/17/2013   CLINICAL DATA:  Perforated diverticulitis.  Evaluate for leak.  EXAM: WATER SOLUBLE CONTRAST ENEMA  TECHNIQUE: Initial scout AP supine abdominal image obtained. Water-soluble contrast (Omnipaque 300) was introduced into the colon in a retrograde fashion and refluxed from the rectum to the cecum. Spot images of the colon followed by overhead radiographs were obtained.  FLUOROSCOPY TIME:  1 min 43 seconds  COMPARISON:  CT abdomen and pelvis 10/16/2013  FINDINGS: Scout abdominal radiograph demonstrates 2 surgical drains in place, terminating in the left lower quadrant. There is mild gaseous distension of small and large bowel. A small amount of residual oral contrast is noted in the colon.  Contrast refluxed readily into the rectum and distal colon. Multiple sigmoid diverticula were seen. The contour of the sigmoid colon was irregular, with the sigmoid colon demonstrating less distension by contrast compared to other portions of the colon, compatible with inflammation and wall thickening seen on prior CTs. The medial margin of the proximal to mid sigmoid colon was particularly irregular with evidence of progressive accumulation of extraluminal contrast along this margin and  collecting posteriorly.  Scattered diverticula were noted in the descending colon. Contrast was refluxed to the level of the cecum. Retained fecal material was noted in the cecum and ascending colon. Allowing for this, limited evaluation of the transverse and more proximal colon demonstrated a normal contour.  Post evacuation overhead radiograph demonstrates a large amount of retained contrast throughout the colon with faint extravasated contrast in the left lower quadrant as previously described.  IMPRESSION: Contrast extravasation from the sigmoid colon consistent with persistent leak/ perforation related to  diverticulitis.  These results will be called to the ordering clinician or representative by the Radiologist Assistant, and communication documented in the PACS or zVision Dashboard.   Electronically Signed   By: Sebastian Ache   On: 10/17/2013 11:21    Anti-infectives: Anti-infectives   Start     Dose/Rate Route Frequency Ordered Stop   10/15/13 1400  sulfamethoxazole-trimethoprim (BACTRIM DS) 800-160 MG per tablet 1 tablet  Status:  Discontinued     1 tablet Oral Every 12 hours 10/15/13 1223 10/17/13 0929   10/08/13 1830  fluconazole (DIFLUCAN) IVPB 200 mg     200 mg 100 mL/hr over 60 Minutes Intravenous Every 24 hours 10/08/13 1740     10/08/13 1346  clindamycin (CLEOCIN) 900 mg, gentamicin (GARAMYCIN) 240 mg in sodium chloride 0.9 % 1,000 mL for intraperitoneal lavage  Status:  Discontinued       As needed 10/08/13 1347 10/08/13 1545   10/08/13 1230  gentamicin (GARAMYCIN) 720 mg, clindamycin (CLEOCIN) 2,700 mg in sodium chloride irrigation 0.9 % 3,000 mL irrigation  Status:  Discontinued      Irrigation  Once 10/08/13 1227 10/11/13 0804   10/08/13 1230  gentamicin (GARAMYCIN) 720 mg, clindamycin (CLEOCIN) 2,700 mg in sodium chloride irrigation 0.9 % 3,000 mL irrigation  Status:  Discontinued      Irrigation  Once 10/08/13 1228 10/11/13 0803   10/08/13 1030  metroNIDAZOLE (FLAGYL) IVPB 500 mg     500 mg 100 mL/hr over 60 Minutes Intravenous Every 6 hours 10/08/13 1025     09/30/13 0600  metroNIDAZOLE (FLAGYL) IVPB 500 mg  Status:  Discontinued     500 mg 100 mL/hr over 60 Minutes Intravenous Every 8 hours 09/30/13 0026 09/30/13 1247   09/30/13 0600  imipenem-cilastatin (PRIMAXIN) 500 mg in sodium chloride 0.9 % 100 mL IVPB     500 mg 200 mL/hr over 30 Minutes Intravenous 4 times per day 09/30/13 0136     09/29/13 2200  imipenem-cilastatin (PRIMAXIN) 500 mg in sodium chloride 0.9 % 100 mL IVPB     500 mg 200 mL/hr over 30 Minutes Intravenous  Once 09/29/13 2157 09/29/13 2353   09/29/13  2200  metroNIDAZOLE (FLAGYL) IVPB 500 mg     500 mg 100 mL/hr over 60 Minutes Intravenous  Once 09/29/13 2157 09/29/13 2320       Assessment/Plan 1. Recurrent Sigmoid diverticulitis with contained perforation admitted 09/29/13  2. New SB dilatation and possible pneumoperitoneum. CT scan show: rim enhancing 5.5 x 6.9 cm fluid collection in the pelvis,10/03/13; reflecting a developing abscess. IR drain placement; 10/05/13, Dr. Lowella Dandy  3. Sigmoid diverticulitis with interloop & pelvic abscesses; SBO: LAPAROSCOPIC DRAINAGE OF ABDOMINAL ABSCESS X2 with drain placement, LAPAROSCOPIC LYSIS OF ADHESIONS x . 10/08/2013, [name], MD.  4. CT scan 10/16/13 shows a new, 8 x 10 cm air/fluid abscess cavity, prior interloop abscess resolved. Not amenable to IR drain.  5.  Continued sigmoid perforation, pSBO - POD #1 s/p  Ex Lap, Hartmans procedure, colon resection/colostomy, washout, alleviation of pSBO 6.  Leukocytosis - like reaction from surgery 7.  Thrombocytosis - 776 - likely reaction from surgery  Plan: 1.  IVF, NPO/bowel rest, NG tube, antibiotics (Primaxin Day #18/flagyl Day #12), antiemetics 2.  Ambulate and IS 3.  SCD's and lovenox 4.  Continue NG tube and NPO until bowel function returns 5.  Start TPN, PICC line in 6.  Recheck labs tomorrow     LOS: 20 days    Randy Park, Randy Park 10/19/2013, 7:33 AM Pager: [phone]

## 2013-10-19 NOTE — Progress Notes (Signed)
PARENTERAL NUTRITION CONSULT NOTE - INITIAL  Pharmacy Consult for TPN Indication: Diverticulitis with perforation and abscess; s/p Hartmann's Procedure  Allergies  Allergen Reactions  . Amoxicillin Rash    Patient Measurements: Height: 5\' 11"  (180.3 cm) Weight: 190 lb 0.6 oz (86.2 kg) IBW/kg (Calculated) : 75.3   Vital Signs: Temp: 98 F (36.7 C) (07/17 0544) Temp src: Oral (07/17 0544) BP: 155/92 mmHg (07/17 0544) Pulse Rate: 92 (07/17 0544) Intake/Output from previous day: 07/16 0701 - 07/17 0700 In: 4705 [I.V.:4675; NG/GT:30] Out: 2970 [Urine:2300; Emesis/NG output:500; Drains:20; Blood:150] Intake/Output from this shift: Total I/O In: 1405 [I.V.:1375; NG/GT:30] Out: 1765 [Urine:1400; Emesis/NG output:350; Drains:15]  Labs:  Recent Labs  10/19/13 0532  WBC 14.7*  HGB 10.5*  HCT 31.9*  PLT 776*     Recent Labs  10/19/13 0532  NA 128*  K 5.2  CL 92*  CO2 24  GLUCOSE 154*  BUN 9  CREATININE 0.86  CALCIUM 8.8  MG 2.1  PHOS 3.2  PROT 6.9  ALBUMIN 2.6*  AST 19  ALT 17  ALKPHOS 48  BILITOT 0.5  Corrected Ca=9.9  Estimated Creatinine Clearance: 109.4 ml/min (by C-G formula based on Cr of 0.86).   No results found for this basename: GLUCAP,  in the last 72 hours  Medical History: Past Medical History  Diagnosis Date  . GERD (gastroesophageal reflux disease)   . Diverticulosis   . Hiatal hernia   . Hyperplastic colonic polyp - colonoscopy 2008 08/26/2007    Qualifier: Diagnosis of  By: Alesia Richards    . COLITIS 08/26/2007    Qualifier: Diagnosis of  By: Alesia Richards      Medications:  Scheduled:  . enoxaparin (LOVENOX) injection  40 mg Subcutaneous Q24H  . fluconazole (DIFLUCAN) IV  200 mg Intravenous Q24H  . imipenem-cilastatin  500 mg Intravenous 4 times per day  . lip balm  1 application Topical BID  . metronidazole  500 mg Intravenous Q6H  . pantoprazole (PROTONIX) IV  40 mg Intravenous QHS   Infusions:  . dextrose 5 %  and 0.9 % NaCl with KCl 20 mEq/L 125 mL/hr at 10/18/13 1900   PRN: diphenhydrAMINE, hydrALAZINE, HYDROmorphone (DILAUDID) injection, magic mouthwash, menthol-cetylpyridinium, metoprolol, ondansetron, phenol  Insulin Requirements in the past 24 hours:  None ordered yet  Current Nutrition:  NPO  Assessment: 37 yoM with sigmoid diverticulitis and contained perforation was admitted 6/27 with initial plans for percutaneous drainage, IV antibiotics, and definitive surgery in a few weeks. Developed a new abscess despite non-surgical interventions, however, and ultimately required Hartmann's procedure on 7/16.  Has been either NPO or on liquid diet since admission.  Orders received 7/16 for PICC placement and begin TPN with pharmacy dosing assistance.  VAS Team will reportedly place PICC later today.   Nutritional Goals: Per RD assessment:  1950-2150 kCal/day 90 - 110 grams of protein/ day Clinimix-E 5/15 at a goal rate of 90 mL/hr + Lipids 20% at 10 mL/hr will provide 108 grams protein/day, 2013 KCal/day  Plan:  After PICC is placed today: 1.  Begin Clinimix-E 5/15 at 50 mL/hr + Fat Emulsion 20% at 10 mL/hr. 2.  Begin CBGs q6h with Novolog SSI (sensitive scale) coverage q6h. 3.  Reduce maintenance IVF to 65 mL/hr unless surgery orders otherwise. 4.  TPN to include multivitamins and trace elements daily. 5.   Follow routine TPN labs starting tomorrow, then every Monday and Thursday. 6.  Over next few days advance Clinimix rate as tolerated  until goal rate is achieved. 7.   Follow for resolution of postoperative ileus and ability to begin POs.  [name] Hermie Reagor, PharmD, BCPS Pager: [phone] 10/19/2013  6:55 AM

## 2013-10-19 NOTE — Progress Notes (Signed)
ANTIBIOTIC CONSULT NOTE - FOLLOW UP  Pharmacy Consult for Primaxin Indication: diverticulitis of colon with perforation; s/p Hartmann's procedure 7/16  Allergies  Allergen Reactions  . Amoxicillin Rash    Patient Measurements: Height: 5\' 11"  (180.3 cm) Weight: 190 lb 0.6 oz (86.2 kg) IBW/kg (Calculated) : 75.3   Vital Signs: Temp: 98.1 F (36.7 C) (07/17 0200) Temp src: Oral (07/17 0200) BP: 137/93 mmHg (07/17 0200) Pulse Rate: 97 (07/17 0200) Intake/Output from previous day: 07/16 0701 - 07/17 0700 In: 4205 [I.V.:4175; NG/GT:30] Out: 2615 [Urine:2100; Emesis/NG output:350; Drains:15; Blood:150] Intake/Output from this shift: Total I/O In: 905 [I.V.:875; NG/GT:30] Out: 1410 [Urine:1200; Emesis/NG output:200; Drains:10]  Labs: No results found for this basename: WBC, HGB, PLT, LABCREA, CREATININE,  in the last 72 hours Estimated Creatinine Clearance: 90.5 ml/min (by C-G formula based on Cr of 1.04).    Microbiology: Recent Results (from the past 720 hour(s))  CULTURE, ROUTINE-ABSCESS     Status: None   Collection Time    10/05/13 10:05 AM      Result Value Ref Range Status   Specimen Description PERITONEAL CAVITY   Final   Special Requests NONE   Final   Gram Stain     Final   Value: RARE WBC PRESENT,BOTH PMN AND MONONUCLEAR     NO SQUAMOUS EPITHELIAL CELLS SEEN     NO ORGANISMS SEEN     Performed at [facility]   Culture     Final   Value: NO GROWTH 3 DAYS     Performed at [facility]   Report Status 10/08/2013 FINAL   Final  BODY FLUID CULTURE     Status: None   Collection Time    10/05/13 12:00 PM      Result Value Ref Range Status   Specimen Description PELVIS   Final   Special Requests NONE   Final   Gram Stain     Final   Value: WBC PRESENT,BOTH PMN AND MONONUCLEAR     NO ORGANISMS SEEN     Performed at [facility]   Culture     Final   Value: NO GROWTH 3 DAYS     Performed at [facility]   Report Status  10/08/2013 FINAL   Final  MRSA PCR SCREENING     Status: None   Collection Time    10/08/13 11:02 AM      Result Value Ref Range Status   MRSA by PCR NEGATIVE  NEGATIVE Final   Comment:            The GeneXpert MRSA Assay (FDA     approved for NASAL specimens     only), is one component of a     comprehensive MRSA colonization     surveillance program. It is not     intended to diagnose MRSA     infection nor to guide or     monitor treatment for     MRSA infections.  CULTURE, ROUTINE-ABSCESS     Status: None   Collection Time    10/08/13  1:37 PM      Result Value Ref Range Status   Specimen Description ABSCESS INTRALOOP   Final   Special Requests NONE   Final   Gram Stain     Final   Value: RARE WBC PRESENT, PREDOMINANTLY MONONUCLEAR     NO SQUAMOUS EPITHELIAL CELLS SEEN     NO ORGANISMS SEEN     Performed at [facility]   Culture     Final   Value: MODERATE CANDIDA ALBICANS     Performed at [facility]   Report Status 10/11/2013 FINAL   Final  ANAEROBIC CULTURE     Status: None   Collection Time    10/08/13  1:37 PM      Result Value Ref Range Status   Specimen Description ABSCESS INTRALOOP   Final   Special Requests NONE   Final   Gram Stain     Final   Value: RARE WBC PRESENT, PREDOMINANTLY MONONUCLEAR     NO SQUAMOUS EPITHELIAL CELLS SEEN     NO ORGANISMS SEEN     Performed at [facility]   Culture     Final   Value: NO ANAEROBES ISOLATED     Performed at [facility]   Report Status 10/13/2013 FINAL   Final  URINE CULTURE     Status: None   Collection Time    10/15/13 11:27 AM      Result Value Ref Range Status   Specimen Description URINE, CLEAN CATCH   Final   Special Requests NONE   Final   Culture  Setup Time     Final   Value: 10/15/2013 20:40     Performed at [facility]   Colony Count     Final   Value: NO GROWTH     Performed at [facility]   Culture     Final   Value: NO GROWTH      Performed at [facility]   Report Status 10/16/2013 FINAL   Final   Anti-infectives: 6/27 >> Flagyl >> 6/28 6/28 >> Primaxin >> 7/6 >> Fluconazole (MD) >> 7/6 >> Flagyl (MD) >> .  Assessment: Randy Park with sigmoid diverticulitis and contained perforation was admitted 6/27 with initial plans for percutaneous drainage, IV antibiotics, and definitive surgery in a few weeks.  Developed a new abscess despite non-surgical interventions, however, and ultimately required Hartmann's procedure on 7/16.  Empiric Primaxin was started on 6/28 with pharmacy dosing assistance requested.  Fluconazole was added on 7/6 after abscess culture grew Candida albicans.  Empiric Flagyl was added on 7/6 as well.  All antibiotics were resumed postoperatively.  7/17: D#20 Primaxin 500 mg IV q6h D#12   Flagyl 500 mg IV q6h [MD dosing] D#12   Fluconazole 200 mg IV q24h [MD dosing] Afebrile since 7/1 SCr and WBC pending today No new culture data, but pus and stool were evacuated from abscess cavity during surgery 7/16 so would anticipate treatment as a polymicrobial infection.   Goal of Therapy:  Appropriate antibiotic dosing for renal function; eradication of infection.   Plan:  1. Continue Primaxin 500 mg IV q6h. 2. Continue additional antimicrobials as ordered by MD 3. Follow serum creatinine, clinical course. 4. Await word from surgery on planned duration of antibiotic therapy  Randy Park Randy Park, PharmD, BCPS Pager: [phone] 10/19/2013  5:46 AM

## 2013-10-19 NOTE — Progress Notes (Signed)
NUTRITION FOLLOW UP/CONSULT FOR TPN  Intervention:   - TPN per pharmacy - Diet advancement per MD - RD to monitor plan of care   Nutrition Dx:   Inadequate oral intake related to clear liquid diet as evidenced by decreased appetite - ongoing but now related to inability to eat as evidenced by NPO.   Goal:   Intake of meals and supplements to meet >90% estimated needs - not met, pt NPO  New goal: TPN to meet >90% of estimated nutritional needs  Monitor:   Weights, labs, diet advancement, TPN, colostomy output   Assessment:   Pt presents with lower abdominal pain that started Friday. 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 He went to local hospital where he was started on cipro and flagyl. CT shows sigmoid diverticulitis with contained perforation and partial small bowel obstruction per MD notes.   6/29: -Pt discussed during multidisciplinary rounds.  -Met with pt who reports eating the "wrong" kind of foods PTA, 4-5 meals/day of mostly fast foods  -States his weight has been stable  -Denies any diarrhea since admission, last emesis was Friday, abdominal pain reportedly has remained the same since admission  -Appeared uncomfortable, did not perform nutrition focused physical exam  -Starting clear liquids today  7/6: -Had CT guided drain placement in pelvic fluid collection from right transgluteal approach 7/3 -Per surgeon's notes, pt had CT scan with interloop abscess w gas - much larger -Still with abdominal distention this morning per PA notes -Currently in OR for open exploration of abdomen with drainage of abscesses  7/13: -Tolerating soft diet with early satiety and decreased appetite. - Intake remains poor.  Ate 1/2 sandwich for lunch. - Patient reported to PA, increased pain with BM and urination and limiting intake secondary to pain.  7/17: - CT on 7/14 showed "new abscess cavity with a large amount of air and some fluid approximately 8 x 10 cm in the pelvis. Previous  interloop abscesses had resolved" - Had colon resection with Hartmanns procedure and colostomy placement 7/16 - No N/V reported today, has NGT in place, plan is for bowel rest and starting TPN - No colostomy output charted so far today  TPN of Clinimix-E 5/15 at a goal rate of 90 mL/hr + Lipids 20% at 10 mL/hr will provide 108 grams protein/day, 2013 KCal/day and meet 100% of estimated nutritional needs     Height: Ht Readings from Last 1 Encounters:  09/29/13 $RemoveB'5\' 11"'iGdETaCt$  (1.803 m)    Weight Status:   Wt Readings from Last 1 Encounters:  10/18/13 190 lb 0.6 oz (86.2 kg)  Admit wt         190 lb (86.1 kg)  Re-estimated needs:  Kcal: 1950-2150  Protein: 100-120g  Fluid: 1.9-2.1L/day   Skin: Intact    Diet Order: NPO   Intake/Output Summary (Last 24 hours) at 10/19/13 1212 Last data filed at 10/19/13 1000  Gross per 24 hour  Intake   4705 ml  Output   3070 ml  Net   1635 ml    Last BM: 7/16   Labs:   Recent Labs Lab 10/13/13 0601 10/16/13 0532 10/19/13 0532  NA 135* 132* 128*  K 3.9 4.4 5.2  CL 97 97 92*  CO2 $Re'25 26 24  'ZKX$ BUN $R'9 9 9  'vu$ CREATININE 0.93 1.04 0.86  CALCIUM 8.7 9.0 8.8  MG  --   --  2.1  PHOS  --   --  3.2  GLUCOSE 103*  99 154*    CBG (last 3)  No results found for this basename: GLUCAP,  in the last 72 hours  Scheduled Meds: . enoxaparin (LOVENOX) injection  40 mg Subcutaneous Q24H  . fluconazole (DIFLUCAN) IV  200 mg Intravenous Q24H  . imipenem-cilastatin  500 mg Intravenous 4 times per day  . [START ON 10/20/2013] insulin aspart  0-9 Units Subcutaneous 4 times per day  . lip balm  1 application Topical BID  . metronidazole  500 mg Intravenous Q6H  . pantoprazole (PROTONIX) IV  40 mg Intravenous QHS    Carlis Stable MS, RD, LDN [phone] Pager [phone] Weekend/After Hours Pager

## 2013-10-19 NOTE — Progress Notes (Signed)
Peripherally Inserted Central Catheter/Midline Placement  The IV Nurse has discussed with the patient and/or persons authorized to consent for the patient, the purpose of this procedure and the potential benefits and risks involved with this procedure.  The benefits include less needle sticks, lab draws from the catheter and patient may be discharged home with the catheter.  Risks include, but not limited to, infection, bleeding, blood clot (thrombus formation), and puncture of an artery; nerve damage and irregular heat beat.  Alternatives to this procedure were also discussed.  PICC/Midline Placement Documentation        Randy Park, Randy Park 10/19/2013, 8:47 AM

## 2013-10-19 NOTE — Progress Notes (Signed)
Patient interviewed and examined, agree with PA note above.  Mariella SaaBenjamin T Kirti Carl MD, FACS  10/19/2013 5:26 PM

## 2013-10-20 LAB — COMPREHENSIVE METABOLIC PANEL
ALBUMIN: 2.4 g/dL — AB (ref 3.5–5.2)
ALK PHOS: 40 U/L (ref 39–117)
ALT: 13 U/L (ref 0–53)
AST: 17 U/L (ref 0–37)
Anion gap: 9 (ref 5–15)
BUN: 12 mg/dL (ref 6–23)
CHLORIDE: 96 meq/L (ref 96–112)
CO2: 29 mEq/L (ref 19–32)
CREATININE: 0.9 mg/dL (ref 0.50–1.35)
Calcium: 8.3 mg/dL — ABNORMAL LOW (ref 8.4–10.5)
GFR calc Af Amer: >90 mL/min (ref >90)
GFR calc non Af Amer: >90 mL/min (ref >90)
Glucose, Bld: 122 mg/dL — ABNORMAL HIGH (ref 70–99)
POTASSIUM: 4.1 meq/L (ref 3.7–5.3)
Sodium: 134 mEq/L — ABNORMAL LOW (ref 137–147)
Total Bilirubin: 0.4 mg/dL (ref 0.3–1.2)
Total Protein: 6 g/dL (ref 6.0–8.3)

## 2013-10-20 LAB — CBC
HEMATOCRIT: 25.9 % — AB (ref 39.0–52.0)
Hemoglobin: 8.4 g/dL — ABNORMAL LOW (ref 13.0–17.0)
MCH: 30.2 pg (ref 26.0–34.0)
MCHC: 32.4 g/dL (ref 30.0–36.0)
MCV: 93.2 fL (ref 78.0–100.0)
PLATELETS: 589 10*3/uL — AB (ref 150–400)
RBC: 2.78 MIL/uL — ABNORMAL LOW (ref 4.22–5.81)
RDW: 14.6 % (ref 11.5–15.5)
WBC: 9 10*3/uL (ref 4.0–10.5)

## 2013-10-20 LAB — MAGNESIUM: MAGNESIUM: 2 mg/dL (ref 1.5–2.5)

## 2013-10-20 LAB — PHOSPHORUS: Phosphorus: 2.3 mg/dL (ref 2.3–4.6)

## 2013-10-20 LAB — GLUCOSE, CAPILLARY
GLUCOSE-CAPILLARY: 129 mg/dL — AB (ref 70–99)
GLUCOSE-CAPILLARY: 134 mg/dL — AB (ref 70–99)
GLUCOSE-CAPILLARY: 136 mg/dL — AB (ref 70–99)
Glucose-Capillary: 121 mg/dL — ABNORMAL HIGH (ref 70–99)

## 2013-10-20 LAB — TRIGLYCERIDES: Triglycerides: 71 mg/dL (ref <150)

## 2013-10-20 MED ORDER — POTASSIUM CHLORIDE IN NACL 20-0.9 MEQ/L-% IV SOLN
INTRAVENOUS | Status: AC
  Administered 2013-10-20: 19:00:00 via INTRAVENOUS
  Filled 2013-10-20 (×2): qty 1000

## 2013-10-20 MED ORDER — DIPHENHYDRAMINE HCL 50 MG/ML IJ SOLN
12.5000 mg | Freq: Four times a day (QID) | INTRAMUSCULAR | Status: DC | PRN
Start: 2013-10-20 — End: 2013-10-24

## 2013-10-20 MED ORDER — FAT EMULSION 20 % IV EMUL
250.0000 mL | INTRAVENOUS | Status: AC
  Administered 2013-10-20: 250 mL via INTRAVENOUS
  Filled 2013-10-20: qty 250

## 2013-10-20 MED ORDER — NALOXONE HCL 0.4 MG/ML IJ SOLN: 0.4000 mg | INTRAMUSCULAR | Status: DC | PRN

## 2013-10-20 MED ORDER — SODIUM CHLORIDE 0.9 % IJ SOLN: 9.0000 mL | INTRAMUSCULAR | Status: DC | PRN

## 2013-10-20 MED ORDER — DIPHENHYDRAMINE HCL 12.5 MG/5ML PO ELIX: 12.5000 mg | ORAL_SOLUTION | Freq: Four times a day (QID) | ORAL | Status: DC | PRN

## 2013-10-20 MED ORDER — TRACE MINERALS CR-CU-F-FE-I-MN-MO-SE-ZN IV SOLN
INTRAVENOUS | Status: AC
  Administered 2013-10-20: 18:00:00 via INTRAVENOUS
  Filled 2013-10-20: qty 2000

## 2013-10-20 MED ORDER — HYDROMORPHONE 0.3 MG/ML IV SOLN
INTRAVENOUS | Status: DC
  Administered 2013-10-20: 10:00:00 via INTRAVENOUS
  Administered 2013-10-20: 3.3 mg via INTRAVENOUS
  Administered 2013-10-20: 11.01 mg via INTRAVENOUS
  Administered 2013-10-20: 2.9 mL via INTRAVENOUS
  Administered 2013-10-21: 4.5 mg via INTRAVENOUS
  Administered 2013-10-21 (×2): via INTRAVENOUS
  Administered 2013-10-21: 0.6 mg via INTRAVENOUS
  Administered 2013-10-21: 6.83 mg via INTRAVENOUS
  Administered 2013-10-22: 1.5 mg via INTRAVENOUS
  Administered 2013-10-22: 0.9 mg via INTRAVENOUS
  Administered 2013-10-22: 3.64 mg via INTRAVENOUS
  Administered 2013-10-22: 2.1 mg via INTRAVENOUS
  Administered 2013-10-22: 1.2 mg via INTRAVENOUS
  Administered 2013-10-22: 2.7 mg via INTRAVENOUS
  Administered 2013-10-23: 1.2 mg via INTRAVENOUS
  Administered 2013-10-23: 0.3 mg via INTRAVENOUS
  Administered 2013-10-23: 1.5 mg via INTRAVENOUS
  Administered 2013-10-23: 2.1 mg via INTRAVENOUS
  Administered 2013-10-23: 09:00:00 via INTRAVENOUS
  Administered 2013-10-23: 0.9 mg via INTRAVENOUS
  Administered 2013-10-24: 0.6 mg via INTRAVENOUS
  Administered 2013-10-24: 0.84 mg via INTRAVENOUS
  Administered 2013-10-24: 0.6 mg via INTRAVENOUS
  Filled 2013-10-20 (×6): qty 25

## 2013-10-20 MED ORDER — ONDANSETRON HCL 4 MG/2ML IJ SOLN
4.0000 mg | Freq: Four times a day (QID) | INTRAMUSCULAR | Status: DC | PRN
  Administered 2013-10-22: 4 mg via INTRAVENOUS
  Filled 2013-10-20: qty 2

## 2013-10-20 NOTE — Progress Notes (Addendum)
PARENTERAL NUTRITION CONSULT NOTE - INITIAL  Pharmacy Consult for TPN Indication: Diverticulitis with perforation and abscess; s/p Hartmann's Procedure  Allergies  Allergen Reactions  . Amoxicillin Rash    Patient Measurements: Height: 5\' 11"  (180.3 cm) Weight: 190 lb 0.6 oz (86.2 kg) IBW/kg (Calculated) : 75.3   Vital Signs: Temp: 98.2 F (36.8 C) (07/18 0500) Temp src: Oral (07/18 0500) BP: 142/93 mmHg (07/18 0500) Pulse Rate: 85 (07/18 0500) Intake/Output from previous day: 07/17 0701 - 07/18 0700 In: 0  Out: 905 [Urine:700; Emesis/NG output:200; Drains:5] Intake/Output from this shift: Total I/O In: 5594.3 [I.V.:900.3; IV Piggyback:3900; TPN:794] Out: 100 [Emesis/NG output:100]  Labs:  Recent Labs  10/19/13 0532 10/20/13 0355  WBC 14.7* 9.0  HGB 10.5* 8.4*  HCT 31.9* 25.9*  PLT 776* 589*     Recent Labs  10/19/13 0532 10/20/13 0355  NA 128* 134*  K 5.2 4.1  CL 92* 96  CO2 24 29  GLUCOSE 154* 122*  BUN 9 12  CREATININE 0.86 0.90  CALCIUM 8.8 8.3*  MG 2.1 2.0  PHOS 3.2 2.3  PROT 6.9 6.0  ALBUMIN 2.6* 2.4*  AST 19 17  ALT 17 13  ALKPHOS 48 40  BILITOT 0.5 0.4  PREALBUMIN 10.8*  --   TRIG 43 71  Corrected Ca=9.9  Estimated Creatinine Clearance: 104.6 ml/min (by C-G formula based on Cr of 0.9).    Recent Labs  10/20/13 0008 10/20/13 0622  GLUCAP 136* 134*    Medical History: Past Medical History  Diagnosis Date  . GERD (gastroesophageal reflux disease)   . Diverticulosis   . Hiatal hernia   . Hyperplastic colonic polyp - colonoscopy 2008 08/26/2007    Qualifier: Diagnosis of  By: Alesia RichardsSmith NCMA, Barbara    . COLITIS 08/26/2007    Qualifier: Diagnosis of  By: Alesia RichardsSmith NCMA, Barbara      Medications:  Scheduled:  . enoxaparin (LOVENOX) injection  40 mg Subcutaneous Q24H  . fluconazole (DIFLUCAN) IV  200 mg Intravenous Q24H  . HYDROmorphone PCA 0.3 mg/mL   Intravenous 6 times per day  . imipenem-cilastatin  500 mg Intravenous 4 times  per day  . insulin aspart  0-9 Units Subcutaneous 4 times per day  . lip balm  1 application Topical BID  . metronidazole  500 mg Intravenous Q6H  . pantoprazole (PROTONIX) IV  40 mg Intravenous QHS   Infusions:  . dextrose 5 % and 0.9 % NaCl with KCl 20 mEq/L 65 mL/hr at 10/20/13 0005  . Marland Kitchen.TPN (CLINIMIX-E) Adult 50 mL/hr at 10/19/13 1816   And  . fat emulsion 250 mL (10/19/13 1816)   PRN: diphenhydrAMINE, diphenhydrAMINE, diphenhydrAMINE, hydrALAZINE, magic mouthwash, menthol-cetylpyridinium, metoprolol, naloxone, ondansetron, ondansetron (ZOFRAN) IV, phenol, sodium chloride, sodium chloride  Insulin Requirements in the past 24 hours:  2 units SSI  Current Nutrition:  NPO Clinimix E 5/15 at 5950ml/hr and lipids 20% 6410ml/hr  Assessment: [age] yoM with sigmoid diverticulitis and contained perforation was admitted 6/27 with initial plans for percutaneous drainage, IV antibiotics, and definitive surgery in a few weeks. Developed a new abscess despite non-surgical interventions, however, and ultimately required Hartmann's procedure on 7/16.  Has been either NPO or on liquid diet since admission.  Orders received 7/16 for PICC placement and begin TPN with pharmacy dosing assistance.  PICC placed 7/17   Glucose - at goal < 150mg /dl on Z6Xq6h Novolog sensitive SSI  Renal - WNL , I/O = incomplete from 7/17  Electrolytes - Na = 134 (improved), other  lytes WNL  LFTs - WNL  TGs - 71 (7/18)  Prealbumin - 10.8 (7/17)  Nutritional Goals: Per RD assessment:  1950-2150 kCal/day 90 - 110 grams of protein/ day Clinimix-E 5/15 at a goal rate of 83 mL/hr + Lipids 20% at 10 mL/hr will provide 100 grams protein/day, 1920kcal/day  Plan:   Tolerating TPN, advance Clinimix-E 5/15 to 53mL/hr + Fat Emulsion 20% at 10 mL/hr.  Expect will be able to advance to goal rate tomorrow  Continue CBGs q6h with Novolog SSI (sensitive scale) coverage q6h.  Reduce maintenance IVF to NS + KCl to 50 mL/hr at  18:00 (adjusted for inc TPN rate and remove D5W)  TPN to include multivitamins and trace elements daily.  TPN labs every Monday and Thursday.  BMP, Mg, Phos in am  Follow for resolution of postoperative ileus and ability to begin POs.  Juliette Alcide, PharmD, BCPS.   Pager: [phone] 10/20/2013  9:46 AM

## 2013-10-20 NOTE — Plan of Care (Signed)
Problem: Phase I Progression Outcomes Goal: Pain controlled with appropriate interventions Outcome: Not Progressing Pt currently on Dilaudid PCA. C/o pain 8/10.

## 2013-10-20 NOTE — Progress Notes (Signed)
2 Days Post-Op  Subjective: He says he is feeling much better  Objective: Vital signs in last 24 hours: Temp:  [97.9 F (36.6 C)-99.3 F (37.4 C)] 98.2 F (36.8 C) (07/18 0500) Pulse Rate:  [85-102] 85 (07/18 0500) Resp:  [14-18] 14 (07/18 1024) BP: (124-154)/(80-97) 142/93 mmHg (07/18 0500) SpO2:  [92 %-100 %] 99 % (07/18 1024) Last BM Date: 10/18/13  Intake/Output from previous day: 07/17 0701 - 07/18 0700 In: 0  Out: 905 [Urine:700; Emesis/NG output:200; Drains:5] Intake/Output this shift: Total I/O In: 5594.3 [I.V.:900.3; IV Piggyback:3900; TPN:794] Out: 550 [Urine:450; Emesis/NG output:100]  Resp: clear to auscultation bilaterally Cardio: regular rate and rhythm GI: soft, appropriately tender. dressing clean. ostomy pink, no output yet  Lab Results:   Recent Labs  10/19/13 0532 10/20/13 0355  WBC 14.7* 9.0  HGB 10.5* 8.4*  HCT 31.9* 25.9*  PLT 776* 589*   BMET  Recent Labs  10/19/13 0532 10/20/13 0355  NA 128* 134*  K 5.2 4.1  CL 92* 96  CO2 24 29  GLUCOSE 154* 122*  BUN 9 12  CREATININE 0.86 0.90  CALCIUM 8.8 8.3*   PT/INR No results found for this basename: LABPROT, INR,  in the last 72 hours ABG No results found for this basename: PHART, PCO2, PO2, HCO3,  in the last 72 hours  Studies/Results: No results found.  Anti-infectives: Anti-infectives   Start     Dose/Rate Route Frequency Ordered Stop   10/15/13 1400  sulfamethoxazole-trimethoprim (BACTRIM DS) 800-160 MG per tablet 1 tablet  Status:  Discontinued     1 tablet Oral Every 12 hours 10/15/13 1223 10/17/13 0929   10/08/13 1830  fluconazole (DIFLUCAN) IVPB 200 mg     200 mg 100 mL/hr over 60 Minutes Intravenous Every 24 hours 10/08/13 1740     10/08/13 1346  clindamycin (CLEOCIN) 900 mg, gentamicin (GARAMYCIN) 240 mg in sodium chloride 0.9 % 1,000 mL for intraperitoneal lavage  Status:  Discontinued       As needed 10/08/13 1347 10/08/13 1545   10/08/13 1230  gentamicin (GARAMYCIN)  720 mg, clindamycin (CLEOCIN) 2,700 mg in sodium chloride irrigation 0.9 % 3,000 mL irrigation  Status:  Discontinued      Irrigation  Once 10/08/13 1227 10/11/13 0804   10/08/13 1230  gentamicin (GARAMYCIN) 720 mg, clindamycin (CLEOCIN) 2,700 mg in sodium chloride irrigation 0.9 % 3,000 mL irrigation  Status:  Discontinued      Irrigation  Once 10/08/13 1228 10/11/13 0803   10/08/13 1030  metroNIDAZOLE (FLAGYL) IVPB 500 mg     500 mg 100 mL/hr over 60 Minutes Intravenous Every 6 hours 10/08/13 1025     09/30/13 0600  metroNIDAZOLE (FLAGYL) IVPB 500 mg  Status:  Discontinued     500 mg 100 mL/hr over 60 Minutes Intravenous Every 8 hours 09/30/13 0026 09/30/13 1247   09/30/13 0600  imipenem-cilastatin (PRIMAXIN) 500 mg in sodium chloride 0.9 % 100 mL IVPB     500 mg 200 mL/hr over 30 Minutes Intravenous 4 times per day 09/30/13 0136     09/29/13 2200  imipenem-cilastatin (PRIMAXIN) 500 mg in sodium chloride 0.9 % 100 mL IVPB     500 mg 200 mL/hr over 30 Minutes Intravenous  Once 09/29/13 2157 09/29/13 2353   09/29/13 2200  metroNIDAZOLE (FLAGYL) IVPB 500 mg     500 mg 100 mL/hr over 60 Minutes Intravenous  Once 09/29/13 2157 09/29/13 2320      Assessment/Plan: Park/p Procedure(Park): COLON RESECTION (N/A) HARTMANNS  PROCEDURE, COLOSTOMY (N/A) continue ng and bowel rest until bowel function returns Ambulate Dressing changes twice a day tpn for nutrition support  LOS: 21 days    Randy Park,Randy Park 10/20/2013

## 2013-10-20 NOTE — Progress Notes (Signed)
Pt continues to refuse to have his foley removed.  Day RN Diane reported that pt wants to discuss it with the MD in the morning.  Pt did walk a full lap around floor with assistance of 2 staff.  NAD.

## 2013-10-21 LAB — GLUCOSE, CAPILLARY
GLUCOSE-CAPILLARY: 118 mg/dL — AB (ref 70–99)
Glucose-Capillary: 116 mg/dL — ABNORMAL HIGH (ref 70–99)
Glucose-Capillary: 120 mg/dL — ABNORMAL HIGH (ref 70–99)
Glucose-Capillary: 126 mg/dL — ABNORMAL HIGH (ref 70–99)

## 2013-10-21 LAB — BASIC METABOLIC PANEL
Anion gap: 8 (ref 5–15)
BUN: 9 mg/dL (ref 6–23)
CALCIUM: 8.9 mg/dL (ref 8.4–10.5)
CO2: 30 mEq/L (ref 19–32)
Chloride: 97 mEq/L (ref 96–112)
Creatinine, Ser: 0.8 mg/dL (ref 0.50–1.35)
GFR calc Af Amer: >90 mL/min (ref >90)
GLUCOSE: 121 mg/dL — AB (ref 70–99)
Potassium: 4.1 mEq/L (ref 3.7–5.3)
Sodium: 135 mEq/L — ABNORMAL LOW (ref 137–147)

## 2013-10-21 LAB — PHOSPHORUS: Phosphorus: 3.2 mg/dL (ref 2.3–4.6)

## 2013-10-21 LAB — MAGNESIUM: Magnesium: 2 mg/dL (ref 1.5–2.5)

## 2013-10-21 MED ORDER — POTASSIUM CHLORIDE IN NACL 20-0.9 MEQ/L-% IV SOLN
INTRAVENOUS | Status: DC
  Administered 2013-10-21 – 2013-10-25 (×2): via INTRAVENOUS
  Filled 2013-10-21 (×5): qty 1000

## 2013-10-21 MED ORDER — TRACE MINERALS CR-CU-F-FE-I-MN-MO-SE-ZN IV SOLN
INTRAVENOUS | Status: AC
  Administered 2013-10-21: 18:00:00 via INTRAVENOUS
  Filled 2013-10-21: qty 2000

## 2013-10-21 MED ORDER — FAT EMULSION 20 % IV EMUL
250.0000 mL | INTRAVENOUS | Status: AC
  Administered 2013-10-21: 250 mL via INTRAVENOUS
  Filled 2013-10-21: qty 250

## 2013-10-21 NOTE — Progress Notes (Signed)
PARENTERAL NUTRITION CONSULT NOTE - Follow-up  Pharmacy Consult for TPN Indication: Diverticulitis with perforation and abscess; s/p Hartmann's Procedure  Allergies  Allergen Reactions  . Amoxicillin Rash    Patient Measurements: Height: 5\' 11"  (180.3 cm) Weight: 190 lb 0.6 oz (86.2 kg) IBW/kg (Calculated) : 75.3   Vital Signs: Temp: 98.9 F (37.2 C) (07/19 0605) Temp src: Oral (07/19 0605) BP: 157/88 mmHg (07/19 0605) Pulse Rate: 90 (07/19 0605) Intake/Output from previous day: 07/18 0701 - 07/19 0700 In: 11921.8 [I.V.:2182.8; NG/GT:90; IV Piggyback:4700; TPN:2174] Out: 2757 [Urine:2200; Emesis/NG output:550; Drains:7] Intake/Output from this shift: Total I/O In: 1000 [Other:1000] Out: 0   Labs:  Recent Labs  10/19/13 0532 10/20/13 0355  WBC 14.7* 9.0  HGB 10.5* 8.4*  HCT 31.9* 25.9*  PLT 776* 589*     Recent Labs  10/19/13 0532 10/20/13 0355 10/21/13 0424  NA 128* 134* 135*  K 5.2 4.1 4.1  CL 92* 96 97  CO2 24 29 30   GLUCOSE 154* 122* 121*  BUN 9 12 9   CREATININE 0.86 0.90 0.80  CALCIUM 8.8 8.3* 8.9  MG 2.1 2.0 2.0  PHOS 3.2 2.3 3.2  PROT 6.9 6.0  --   ALBUMIN 2.6* 2.4*  --   AST 19 17  --   ALT 17 13  --   ALKPHOS 48 40  --   BILITOT 0.5 0.4  --   PREALBUMIN 10.8*  --   --   TRIG 43 71  --   Corrected Ca=9.9  Estimated Creatinine Clearance: 117.7 ml/min (by C-G formula based on Cr of 0.8).    Recent Labs  10/20/13 1815 10/21/13 0007 10/21/13 0604  GLUCAP 129* 126* 116*    Medications:  Scheduled:  . enoxaparin (LOVENOX) injection  40 mg Subcutaneous Q24H  . fluconazole (DIFLUCAN) IV  200 mg Intravenous Q24H  . HYDROmorphone PCA 0.3 mg/mL   Intravenous 6 times per day  . imipenem-cilastatin  500 mg Intravenous 4 times per day  . insulin aspart  0-9 Units Subcutaneous 4 times per day  . lip balm  1 application Topical BID  . metronidazole  500 mg Intravenous Q6H  . pantoprazole (PROTONIX) IV  40 mg Intravenous QHS    Infusions:  . 0.9 % NaCl with KCl 20 mEq / L 50 mL/hr at 10/20/13 1846  . Marland KitchenTPN (CLINIMIX-E) Adult 65 mL/hr at 10/20/13 1825   And  . fat emulsion 250 mL (10/20/13 1825)   PRN: diphenhydrAMINE, diphenhydrAMINE, diphenhydrAMINE, hydrALAZINE, magic mouthwash, menthol-cetylpyridinium, metoprolol, naloxone, ondansetron, ondansetron (ZOFRAN) IV, phenol, sodium chloride, sodium chloride  Insulin Requirements in the past 24 hours:  2 units Novolog SSI  Current Nutrition:  NPO Clinimix E 5/15 at 19ml/hr and lipids 20% 40ml/hr  Assessment: 20 yoM with sigmoid diverticulitis and contained perforation was admitted 6/27 with initial plans for percutaneous drainage, IV antibiotics, and definitive surgery in a few weeks. Developed a new abscess despite non-surgical interventions, however, and ultimately required Hartmann's procedure on 7/16.  Has been either NPO or on liquid diet since admission.  Orders received 7/16 for PICC placement and begin TPN with pharmacy dosing assistance.   PICC placed 7/17 Day #3 TPN   Glucose - at goal < 150mg /dl on J1B Novolog sensitive SSI  Renal - WNL , I/O = incomplete from 7/17  Electrolytes - Na = 135 (improved), other lytes WNL  LFTs - WNL  TGs - 71 (7/18)  Prealbumin - 10.8 (7/17)  Nutritional Goals: Per RD assessment:  [id number]  kCal/day 90 - 110 grams of protein/ day Clinimix-E 5/15 at a goal rate of 83 mL/hr + Lipids 20% at 10 mL/hr will provide 100 grams protein/day, 1920kcal/day  Plan:   Tolerating TPN, advance Clinimix-E 5/15 to goal rate 7283mL/hr + Fat Emulsion 20% at 10 mL/hr.  Expect will be able to advance to goal rate tomorrow  Continue CBGs q6h with Novolog SSI (sensitive scale) coverage q6h.  Reduce MIVF to 30 mL/hr at 18:00 (adjusted for inc TPN rate)  TPN to include multivitamins and trace elements daily.  TPN labs every Monday and Thursday.  Follow for resolution of postoperative ileus and ability to begin POs.  Juliette Alcideustin  Earlyne Feeser, PharmD, BCPS.   Pager: [phone] 10/21/2013  11:32 AM

## 2013-10-21 NOTE — Progress Notes (Signed)
3 Days Post-Op  Subjective: Feels better, pain controlled  Objective: Vital signs in last 24 hours: Temp:  [98.4 F (36.9 C)-98.9 F (37.2 C)] 98.9 F (37.2 C) (07/19 0605) Pulse Rate:  [86-90] 90 (07/19 0605) Resp:  [14-28] 16 (07/19 0605) BP: (134-163)/(84-88) 157/88 mmHg (07/19 0605) SpO2:  [97 %-100 %] 97 % (07/19 0605) Last BM Date: 10/18/13 (pre op)  Intake/Output from previous day: 07/18 0701 - 07/19 0700 In: 11921.8 [I.V.:2182.8; NG/GT:90; IV Piggyback:4700; TPN:2174] Out: 2757 [Urine:2200; Emesis/NG output:550; Drains:7] Intake/Output this shift:    Resp: clear to auscultation bilaterally Cardio: regular rate and rhythm GI: soft, appropriately tender. dressing clean. ostomy pink, no output yet  Lab Results:   Recent Labs  10/19/13 0532 10/20/13 0355  WBC 14.7* 9.0  HGB 10.5* 8.4*  HCT 31.9* 25.9*  PLT 776* 589*   BMET  Recent Labs  10/20/13 0355 10/21/13 0424  NA 134* 135*  K 4.1 4.1  CL 96 97  CO2 29 30  GLUCOSE 122* 121*  BUN 12 9  CREATININE 0.90 0.80  CALCIUM 8.3* 8.9   PT/INR No results found for this basename: LABPROT, INR,  in the last 72 hours ABG No results found for this basename: PHART, PCO2, PO2, HCO3,  in the last 72 hours  Studies/Results: No results found.  Anti-infectives: Anti-infectives   Start     Dose/Rate Route Frequency Ordered Stop   10/15/13 1400  sulfamethoxazole-trimethoprim (BACTRIM DS) 800-160 MG per tablet 1 tablet  Status:  Discontinued     1 tablet Oral Every 12 hours 10/15/13 1223 10/17/13 0929   10/08/13 1830  fluconazole (DIFLUCAN) IVPB 200 mg     200 mg 100 mL/hr over 60 Minutes Intravenous Every 24 hours 10/08/13 1740     10/08/13 1346  clindamycin (CLEOCIN) 900 mg, gentamicin (GARAMYCIN) 240 mg in sodium chloride 0.9 % 1,000 mL for intraperitoneal lavage  Status:  Discontinued       As needed 10/08/13 1347 10/08/13 1545   10/08/13 1230  gentamicin (GARAMYCIN) 720 mg, clindamycin (CLEOCIN) 2,700 mg in  sodium chloride irrigation 0.9 % 3,000 mL irrigation  Status:  Discontinued      Irrigation  Once 10/08/13 1227 10/11/13 0804   10/08/13 1230  gentamicin (GARAMYCIN) 720 mg, clindamycin (CLEOCIN) 2,700 mg in sodium chloride irrigation 0.9 % 3,000 mL irrigation  Status:  Discontinued      Irrigation  Once 10/08/13 1228 10/11/13 0803   10/08/13 1030  metroNIDAZOLE (FLAGYL) IVPB 500 mg     500 mg 100 mL/hr over 60 Minutes Intravenous Every 6 hours 10/08/13 1025     09/30/13 0600  metroNIDAZOLE (FLAGYL) IVPB 500 mg  Status:  Discontinued     500 mg 100 mL/hr over 60 Minutes Intravenous Every 8 hours 09/30/13 0026 09/30/13 1247   09/30/13 0600  imipenem-cilastatin (PRIMAXIN) 500 mg in sodium chloride 0.9 % 100 mL IVPB     500 mg 200 mL/hr over 30 Minutes Intravenous 4 times per day 09/30/13 0136     09/29/13 2200  imipenem-cilastatin (PRIMAXIN) 500 mg in sodium chloride 0.9 % 100 mL IVPB     500 mg 200 mL/hr over 30 Minutes Intravenous  Once 09/29/13 2157 09/29/13 2353   09/29/13 2200  metroNIDAZOLE (FLAGYL) IVPB 500 mg     500 mg 100 mL/hr over 60 Minutes Intravenous  Once 09/29/13 2157 09/29/13 2320      Assessment/Plan: s/p Procedure(s): COLON RESECTION (N/A) HARTMANNS PROCEDURE, COLOSTOMY (N/A) continue ng and bowel rest  until bowel function returns Ambulate Dressing changes twice a day tpn for nutrition support  LOS: 22 days    Symia Herdt C. 10/21/2013

## 2013-10-22 LAB — COMPREHENSIVE METABOLIC PANEL
ALK PHOS: 47 U/L (ref 39–117)
ALT: 12 U/L (ref 0–53)
AST: 16 U/L (ref 0–37)
Albumin: 2.9 g/dL — ABNORMAL LOW (ref 3.5–5.2)
Anion gap: 10 (ref 5–15)
BUN: 14 mg/dL (ref 6–23)
CALCIUM: 9.4 mg/dL (ref 8.4–10.5)
CO2: 28 meq/L (ref 19–32)
Chloride: 96 mEq/L (ref 96–112)
Creatinine, Ser: 0.79 mg/dL (ref 0.50–1.35)
GFR calc Af Amer: >90 mL/min (ref >90)
Glucose, Bld: 123 mg/dL — ABNORMAL HIGH (ref 70–99)
POTASSIUM: 4.2 meq/L (ref 3.7–5.3)
SODIUM: 134 meq/L — AB (ref 137–147)
Total Bilirubin: 0.5 mg/dL (ref 0.3–1.2)
Total Protein: 7.3 g/dL (ref 6.0–8.3)

## 2013-10-22 LAB — PREALBUMIN: Prealbumin: 16.7 mg/dL — ABNORMAL LOW (ref 17.0–34.0)

## 2013-10-22 LAB — CBC
HEMATOCRIT: 31.3 % — AB (ref 39.0–52.0)
HEMOGLOBIN: 10.3 g/dL — AB (ref 13.0–17.0)
MCH: 29.9 pg (ref 26.0–34.0)
MCHC: 32.9 g/dL (ref 30.0–36.0)
MCV: 91 fL (ref 78.0–100.0)
Platelets: 684 10*3/uL — ABNORMAL HIGH (ref 150–400)
RBC: 3.44 MIL/uL — ABNORMAL LOW (ref 4.22–5.81)
RDW: 14.3 % (ref 11.5–15.5)
WBC: 8.3 10*3/uL (ref 4.0–10.5)

## 2013-10-22 LAB — GLUCOSE, CAPILLARY
GLUCOSE-CAPILLARY: 115 mg/dL — AB (ref 70–99)
GLUCOSE-CAPILLARY: 121 mg/dL — AB (ref 70–99)
Glucose-Capillary: 114 mg/dL — ABNORMAL HIGH (ref 70–99)
Glucose-Capillary: 140 mg/dL — ABNORMAL HIGH (ref 70–99)
Glucose-Capillary: 142 mg/dL — ABNORMAL HIGH (ref 70–99)

## 2013-10-22 LAB — TRIGLYCERIDES: Triglycerides: 97 mg/dL (ref <150)

## 2013-10-22 LAB — DIFFERENTIAL
Basophils Absolute: 0.1 10*3/uL (ref 0.0–0.1)
Basophils Relative: 1 % (ref 0–1)
Eosinophils Absolute: 0.3 10*3/uL (ref 0.0–0.7)
Eosinophils Relative: 4 % (ref 0–5)
LYMPHS PCT: 15 % (ref 12–46)
Lymphs Abs: 1.2 10*3/uL (ref 0.7–4.0)
MONO ABS: 1.3 10*3/uL — AB (ref 0.1–1.0)
MONOS PCT: 16 % — AB (ref 3–12)
NEUTROS ABS: 5.5 10*3/uL (ref 1.7–7.7)
Neutrophils Relative %: 66 % (ref 43–77)

## 2013-10-22 LAB — PHOSPHORUS: Phosphorus: 3.3 mg/dL (ref 2.3–4.6)

## 2013-10-22 LAB — MAGNESIUM: Magnesium: 2.1 mg/dL (ref 1.5–2.5)

## 2013-10-22 MED ORDER — TRACE MINERALS CR-CU-F-FE-I-MN-MO-SE-ZN IV SOLN
INTRAVENOUS | Status: DC
  Filled 2013-10-22: qty 2160

## 2013-10-22 MED ORDER — TRACE MINERALS CR-CU-F-FE-I-MN-MO-SE-ZN IV SOLN
INTRAVENOUS | Status: AC
  Administered 2013-10-22: 18:00:00 via INTRAVENOUS
  Filled 2013-10-22: qty 2000

## 2013-10-22 MED ORDER — FAT EMULSION 20 % IV EMUL
250.0000 mL | INTRAVENOUS | Status: AC
  Administered 2013-10-22: 250 mL via INTRAVENOUS
  Filled 2013-10-22: qty 250

## 2013-10-22 NOTE — Progress Notes (Signed)
Patient ID: Randy Park, male   DOB: 1961/03/05, 53 y.o.   MRN: [id number] 4 Days Post-Op  Subjective: Patient feeling okay today. He states he has a little bit in his ostomy bag. His pain is well controlled.  Objective: Vital signs in last 24 hours: Temp:  [98 F (36.7 C)-99.1 F (37.3 C)] 98.6 F (37 C) (07/20 1000) Pulse Rate:  [84-100] 100 (07/20 1000) Resp:  [12-18] 14 (07/20 1000) BP: (130-160)/(84-99) 132/86 mmHg (07/20 1000) SpO2:  [95 %-99 %] 97 % (07/20 1000) Last BM Date: 10/19/13  Intake/Output from previous day: 07/19 0701 - 07/20 0700 In: 2152.3 [I.V.:862.3; NG/GT:90; IV Piggyback:200] Out: 2780 [Urine:2275; Emesis/NG output:500; Drains:5] Intake/Output this shift: Total I/O In: 30 [NG/GT:30] Out: 552 [Urine:400; Emesis/NG output:150; Stool:2]  PE: Abd: Soft, appropriately tender. Wound is packed. This was just completed and not taken down. Nurse says this is clean. Stoma is pink and viable. Ostomy pouch has no significant output or air. However, he has great bowel sounds today. NG tube still with some bilious output.  Lab Results:   Recent Labs  10/20/13 0355 10/22/13 0501  WBC 9.0 8.3  HGB 8.4* 10.3*  HCT 25.9* 31.3*  PLT 589* 684*   BMET  Recent Labs  10/21/13 0424 10/22/13 0501  NA 135* 134*  K 4.1 4.2  CL 97 96  CO2 30 28  GLUCOSE 121* 123*  BUN 9 14  CREATININE 0.80 0.79  CALCIUM 8.9 9.4   PT/INR No results found for this basename: LABPROT, INR,  in the last 72 hours CMP     Component Value Date/Time   NA 134* 10/22/2013 0501   K 4.2 10/22/2013 0501   CL 96 10/22/2013 0501   CO2 28 10/22/2013 0501   GLUCOSE 123* 10/22/2013 0501   BUN 14 10/22/2013 0501   CREATININE 0.79 10/22/2013 0501   CALCIUM 9.4 10/22/2013 0501   PROT 7.3 10/22/2013 0501   ALBUMIN 2.9* 10/22/2013 0501   AST 16 10/22/2013 0501   ALT 12 10/22/2013 0501   ALKPHOS 47 10/22/2013 0501   BILITOT 0.5 10/22/2013 0501   GFRNONAA >90 10/22/2013 0501   GFRAA >90 10/22/2013  0501   Lipase     Component Value Date/Time   LIPASE 12 09/29/2013 1848       Studies/Results: No results found.  Anti-infectives: Anti-infectives   Start     Dose/Rate Route Frequency Ordered Stop   10/15/13 1400  sulfamethoxazole-trimethoprim (BACTRIM DS) 800-160 MG per tablet 1 tablet  Status:  Discontinued     1 tablet Oral Every 12 hours 10/15/13 1223 10/17/13 0929   10/08/13 1830  fluconazole (DIFLUCAN) IVPB 200 mg     200 mg 100 mL/hr over 60 Minutes Intravenous Every 24 hours 10/08/13 1740     10/08/13 1346  clindamycin (CLEOCIN) 900 mg, gentamicin (GARAMYCIN) 240 mg in sodium chloride 0.9 % 1,000 mL for intraperitoneal lavage  Status:  Discontinued       As needed 10/08/13 1347 10/08/13 1545   10/08/13 1230  gentamicin (GARAMYCIN) 720 mg, clindamycin (CLEOCIN) 2,700 mg in sodium chloride irrigation 0.9 % 3,000 mL irrigation  Status:  Discontinued      Irrigation  Once 10/08/13 1227 10/11/13 0804   10/08/13 1230  gentamicin (GARAMYCIN) 720 mg, clindamycin (CLEOCIN) 2,700 mg in sodium chloride irrigation 0.9 % 3,000 mL irrigation  Status:  Discontinued      Irrigation  Once 10/08/13 1228 10/11/13 0803   10/08/13 1030  metroNIDAZOLE (FLAGYL) IVPB 500  mg     500 mg 100 mL/hr over 60 Minutes Intravenous Every 6 hours 10/08/13 1025     09/30/13 0600  metroNIDAZOLE (FLAGYL) IVPB 500 mg  Status:  Discontinued     500 mg 100 mL/hr over 60 Minutes Intravenous Every 8 hours 09/30/13 0026 09/30/13 1247   09/30/13 0600  imipenem-cilastatin (PRIMAXIN) 500 mg in sodium chloride 0.9 % 100 mL IVPB     500 mg 200 mL/hr over 30 Minutes Intravenous 4 times per day 09/30/13 0136     09/29/13 2200  imipenem-cilastatin (PRIMAXIN) 500 mg in sodium chloride 0.9 % 100 mL IVPB     500 mg 200 mL/hr over 30 Minutes Intravenous  Once 09/29/13 2157 09/29/13 2353   09/29/13 2200  metroNIDAZOLE (FLAGYL) IVPB 500 mg     500 mg 100 mL/hr over 60 Minutes Intravenous  Once 09/29/13 2157 09/29/13 2320        Assessment/Plan   1. Recurrent Sigmoid diverticulitis with contained perforation admitted 09/29/13  2. New SB dilatation and possible pneumoperitoneum. CT scan show: rim enhancing 5.5 x 6.9 cm fluid collection in the pelvis,10/03/13; reflecting a developing abscess. IR drain placement; 10/05/13, Dr. Lowella DandyHenn  3. Sigmoid diverticulitis with interloop & pelvic abscesses; SBO: LAPAROSCOPIC DRAINAGE OF ABDOMINAL ABSCESS X2 with drain placement, LAPAROSCOPIC LYSIS OF ADHESIONS x 90min. 10/08/2013, Ardeth SportsmanSteven C. Gross, MD.  4. Continued sigmoid perforation, pSBO - POD #4 s/p Ex Lap, Hartmans procedure, colon resection/colostomy, washout, alleviation of pSBO  5. Leukocytosis - like reaction from surgery  6. Thrombocytosis - 684 7. PCM/TMA  Plan: 1. Continue TNA while unable to eat for nutritional support. 2. The patient does not have much in the way of ostomy output; however, he has very active bowel sounds today. I will clamp his NG tube and if he tolerates this, hopefully we can discontinue that tomorrow. 3. Continue PCA for pain control 4. Continue mobilization and pulmonary toilet 5. Continue antibiotic therapy.   LOS: 23 days    Burl Tauzin E 10/22/2013, 10:13 AM Pager: [phone]

## 2013-10-22 NOTE — Progress Notes (Signed)
ANTIBIOTIC CONSULT NOTE - FOLLOW UP  Pharmacy Consult for Primaxin Indication: diverticulitis of colon with perforation; s/p Hartmann's procedure 7/16  Allergies  Allergen Reactions  . Amoxicillin Rash    Patient Measurements: Height: 5\' 11"  (180.3 cm) Weight: 190 lb 0.6 oz (86.2 kg) IBW/kg (Calculated) : 75.3   Vital Signs: Temp: 98 F (36.7 C) (07/20 0500) Temp src: Oral (07/20 0500) BP: 142/99 mmHg (07/20 0500) Pulse Rate: 92 (07/20 0500) Intake/Output from previous day: 07/19 0701 - 07/20 0700 In: 2152.3 [I.V.:862.3; NG/GT:90; IV Piggyback:200] Out: 2780 [Urine:2275; Emesis/NG output:500; Drains:5] Intake/Output from this shift: Total I/O In: 30 [NG/GT:30] Out: -   Labs:  Recent Labs  10/20/13 0355 10/21/13 0424 10/22/13 0501  WBC 9.0  --  8.3  HGB 8.4*  --  10.3*  PLT 589*  --  684*  CREATININE 0.90 0.80 0.79   Estimated Creatinine Clearance: 117.7 ml/min (by C-G formula based on Cr of 0.79).    Microbiology: Recent Results (from the past 720 hour(s))  CULTURE, ROUTINE-ABSCESS     Status: None   Collection Time    10/05/13 10:05 AM      Result Value Ref Range Status   Specimen Description PERITONEAL CAVITY   Final   Special Requests NONE   Final   Gram Stain     Final   Value: RARE WBC PRESENT,BOTH PMN AND MONONUCLEAR     NO SQUAMOUS EPITHELIAL CELLS SEEN     NO ORGANISMS SEEN     [facility]   Culture     Final   Value: NO GROWTH 3 DAYS     Performed at [facility]   Report Status 10/08/2013 FINAL   Final  BODY FLUID CULTURE     Status: None   Collection Time    10/05/13 12:00 PM      Result Value Ref Range Status   Specimen Description PELVIS   Final   Special Requests NONE   Final   Gram Stain     Final   Value: WBC PRESENT,BOTH PMN AND MONONUCLEAR     NO ORGANISMS SEEN     Performed at [facility]   Culture     Final   Value: NO GROWTH 3 DAYS     Performed at [facility]   Report  Status 10/08/2013 FINAL   Final  MRSA PCR SCREENING     Status: None   Collection Time    10/08/13 11:02 AM      Result Value Ref Range Status   MRSA by PCR NEGATIVE  NEGATIVE Final   Comment:            The GeneXpert MRSA Assay (FDA     approved for NASAL specimens     only), is one component of a     comprehensive MRSA colonization     surveillance program. It is not     intended to diagnose MRSA     infection nor to guide or     monitor treatment for     MRSA infections.  CULTURE, ROUTINE-ABSCESS     Status: None   Collection Time    10/08/13  1:37 PM      Result Value Ref Range Status   Specimen Description ABSCESS INTRALOOP   Final   Special Requests NONE   Final   Gram Stain     Final   Value: RARE WBC PRESENT, PREDOMINANTLY MONONUCLEAR     NO  SQUAMOUS EPITHELIAL CELLS SEEN     NO ORGANISMS SEEN     Performed at [facility]   Culture     Final   Value: MODERATE CANDIDA ALBICANS     Performed at [facility]   Report Status 10/11/2013 FINAL   Final  ANAEROBIC CULTURE     Status: None   Collection Time    10/08/13  1:37 PM      Result Value Ref Range Status   Specimen Description ABSCESS INTRALOOP   Final   Special Requests NONE   Final   Gram Stain     Final   Value: RARE WBC PRESENT, PREDOMINANTLY MONONUCLEAR     NO SQUAMOUS EPITHELIAL CELLS SEEN     NO ORGANISMS SEEN     Performed at [facility]   Culture     Final   Value: NO ANAEROBES ISOLATED     Performed at [facility]   Report Status 10/13/2013 FINAL   Final  URINE CULTURE     Status: None   Collection Time    10/15/13 11:27 AM      Result Value Ref Range Status   Specimen Description URINE, CLEAN CATCH   Final   Special Requests NONE   Final   Culture  Setup Time     Final   Value: 10/15/2013 20:40     Performed at [facility] Count     Final   Value: NO GROWTH     Performed at [facility]   Culture     Final   Value: NO GROWTH      Performed at [facility]   Report Status 10/16/2013 FINAL   Final   Anti-infectives: 6/27 >> Flagyl >> 6/28 6/28 >> Primaxin >> 7/6 >> Fluconazole (MD) >> 7/6 >> Flagyl (MD) >> .  Assessment: 27 yoM with sigmoid diverticulitis and contained perforation was admitted 6/27 with initial plans for percutaneous drainage, IV antibiotics, and definitive surgery in a few weeks.  Developed a new abscess despite non-surgical interventions, however, and ultimately required Hartmann's procedure on 7/16.  Empiric Primaxin was started on 6/28 with pharmacy dosing assistance requested.  Fluconazole was added on 7/6 after abscess culture grew Candida albicans.  Empiric Flagyl was added on 7/6 as well.  All antibiotics were resumed postoperatively.  7/20:  POD#4  D#23 Primaxin 500 mg IV q6h  D#15   Flagyl 500 mg IV q6h [MD dosing]  D#15   Fluconazole 200 mg IV q24h [MD dosing]  Fever and leukocytosis resolved  No new culture data, but pus and stool were evacuated from abscess cavity during surgery 7/16 so would anticipate treatment as a polymicrobial infection.   Goal of Therapy:  Appropriate antibiotic dosing for renal function; eradication of infection.   Plan:  1. Continue current Primaxin dosage (500 mg IV q6h). 2. Continue additional antimicrobials as ordered by MD. 3. Follow serum creatinine, clinical course. 4. Await word from surgery on planned duration of antibiotic therapy.  Elie Goody, PharmD, BCPS Pager: [phone] 10/22/2013  8:51 AM

## 2013-10-22 NOTE — Progress Notes (Signed)
PARENTERAL NUTRITION CONSULT NOTE - Follow-up  Pharmacy Consult for TPN Indication: Diverticulitis with perforation and abscess; s/p Hartmann's Procedure  Allergies  Allergen Reactions  . Amoxicillin Rash    Patient Measurements: Height: 5\' 11"  (180.3 cm) Weight: 190 lb 0.6 oz (86.2 kg) IBW/kg (Calculated) : 75.3   Vital Signs: Temp: 98 F (36.7 C) (07/20 0500) Temp src: Oral (07/20 0500) BP: 142/99 mmHg (07/20 0500) Pulse Rate: 92 (07/20 0500) Intake/Output from previous day: 07/19 0701 - 07/20 0700 In: 2152.3 [I.V.:862.3; NG/GT:90; IV Piggyback:200] Out: 2780 [Urine:2275; Emesis/NG output:500; Drains:5] Intake/Output from this shift: Total I/O In: 30 [NG/GT:30] Out: -   Labs:  Recent Labs  10/20/13 0355 10/22/13 0501  WBC 9.0 8.3  HGB 8.4* 10.3*  HCT 25.9* 31.3*  PLT 589* 684*     Recent Labs  10/20/13 0355 10/21/13 0424 10/22/13 0501  NA 134* 135* 134*  K 4.1 4.1 4.2  CL 96 97 96  CO2 29 30 28   GLUCOSE 122* 121* 123*  BUN 12 9 14   CREATININE 0.90 0.80 0.79  CALCIUM 8.3* 8.9 9.4  MG 2.0 2.0 2.1  PHOS 2.3 3.2 3.3  PROT 6.0  --  7.3  ALBUMIN 2.4*  --  2.9*  AST 17  --  16  ALT 13  --  12  ALKPHOS 40  --  47  BILITOT 0.4  --  0.5  TRIG 71  --  97  Corrected Ca = 10.3  Estimated Creatinine Clearance: 117.7 ml/min (by C-G formula based on Cr of 0.79).    Recent Labs  10/21/13 1750 10/22/13 0002 10/22/13 0625  GLUCAP 118* 114* 142*    Medications:  Scheduled:  . enoxaparin (LOVENOX) injection  40 mg Subcutaneous Q24H  . fluconazole (DIFLUCAN) IV  200 mg Intravenous Q24H  . HYDROmorphone PCA 0.3 mg/mL   Intravenous 6 times per day  . imipenem-cilastatin  500 mg Intravenous 4 times per day  . insulin aspart  0-9 Units Subcutaneous 4 times per day  . lip balm  1 application Topical BID  . metronidazole  500 mg Intravenous Q6H  . pantoprazole (PROTONIX) IV  40 mg Intravenous QHS   Infusions:  . 0.9 % NaCl with KCl 20 mEq / L 30 mL/hr  at 10/21/13 2132  . Marland Kitchen.TPN (CLINIMIX-E) Adult 83 mL/hr at 10/21/13 1819   And  . fat emulsion 250 mL (10/21/13 1819)   PRN: diphenhydrAMINE, diphenhydrAMINE, diphenhydrAMINE, hydrALAZINE, magic mouthwash, menthol-cetylpyridinium, metoprolol, naloxone, ondansetron, ondansetron (ZOFRAN) IV, phenol, sodium chloride, sodium chloride  Insulin Requirements in the past 24 hours:  1 unit Novolog sliding scale (on sensitive-scale coverage q6h)  Nutritional Goals: Per updated RD assessment 7/17: 1950-2150 kCal/day 100-120 grams protein/day Clinimix-E 5/15 at a goal rate of 90 mL/hr + Lipids 20% at 10 mL/hr will provide 108 grams protein/day,  2013 Kcal/day  Current Nutrition:  NPO NG tube in place Clinimix E 5/15 at 83 mL/hr and lipids 20% 3410ml/hr  IVF: NS + KCl 20 mEq/L at 30 mL/hr  Assessment: [age] yoM with sigmoid diverticulitis and contained perforation was admitted 6/27 with initial plans for percutaneous drainage, IV antibiotics, and definitive surgery in a few weeks. Developed a new abscess despite non-surgical interventions, however, and ultimately required Hartmann's procedure on 7/16.  Had been either NPO or on liquid diet since admission.  Orders received 7/16 for PICC placement and begin TPN with pharmacy dosing assistance. PICC was placed 7/17 and TPN was started that evening.  7/20: Beginning Day #4  TPN   Glucose - CBGs at goal < 150mg /dL on Z6X Novolog sensitive SSI  Renal - SCr and BUN WNL; I/O appears incomplete.   Electrolytes - Na slightly low but stable; other lytes WNL  LFTs - all below ULN  TGs - WNL  Prealbumin - 10.8 (7/17), today's value pending.  Overall demonstrating excellent tolerance of TPN.   Plan:  1. When next bag TPN begins tonight at 1800:  Increase Clinimix-E 5/15 to goal rate of 90 mL/hr  Continue Fat Emulsion 20% at 10 mL/hr 2. Continue CBGs q6h with Novolog SSI (sensitive scale) coverage q6h. 3. TPN to include multivitamins and trace  elements daily. 4. TPN labs every Monday and Thursday. 5. F/U on today's prealbumin when result available. 6. Follow daily for resolution of postoperative ileus and ability to begin POs.  Elie Goody, PharmD, BCPS Pager: [phone] 10/22/2013  8:37 AM

## 2013-10-22 NOTE — Progress Notes (Signed)
PARENTERAL NUTRITION CONSULT NOTE - Follow-up  Pharmacy Consult for TPN Indication: Diverticulitis with perforation and abscess; s/p Hartmann's Procedure  Allergies  Allergen Reactions  . Amoxicillin Rash    Patient Measurements: Height: 5\' 11"  (180.3 cm) Weight: 190 lb 0.6 oz (86.2 kg) IBW/kg (Calculated) : 75.3   Vital Signs: Temp: 98.6 F (37 C) (07/20 1000) Temp src: Oral (07/20 1000) BP: 132/86 mmHg (07/20 1000) Pulse Rate: 100 (07/20 1000) Intake/Output from previous day: 07/19 0701 - 07/20 0700 In: 2152.3 [I.V.:862.3; NG/GT:90; IV Piggyback:200] Out: 2780 [Urine:2275; Emesis/NG output:500; Drains:5] Intake/Output from this shift: Total I/O In: 30 [NG/GT:30] Out: 552 [Urine:400; Emesis/NG output:150; Stool:2]  Labs:  Recent Labs  10/20/13 0355 10/22/13 0501  WBC 9.0 8.3  HGB 8.4* 10.3*  HCT 25.9* 31.3*  PLT 589* 684*     Recent Labs  10/20/13 0355 10/21/13 0424 10/22/13 0501  NA 134* 135* 134*  K 4.1 4.1 4.2  CL 96 97 96  CO2 29 30 28   GLUCOSE 122* 121* 123*  BUN 12 9 14   CREATININE 0.90 0.80 0.79  CALCIUM 8.3* 8.9 9.4  MG 2.0 2.0 2.1  PHOS 2.3 3.2 3.3  PROT 6.0  --  7.3  ALBUMIN 2.4*  --  2.9*  AST 17  --  16  ALT 13  --  12  ALKPHOS 40  --  47  BILITOT 0.4  --  0.5  TRIG 71  --  97  Corrected Ca = 10.3  Estimated Creatinine Clearance: 117.7 ml/min (by C-G formula based on Cr of 0.79).    Recent Labs  10/21/13 1750 10/22/13 0002 10/22/13 0625  GLUCAP 118* 114* 142*    Medications:  Scheduled:  . enoxaparin (LOVENOX) injection  40 mg Subcutaneous Q24H  . fluconazole (DIFLUCAN) IV  200 mg Intravenous Q24H  . HYDROmorphone PCA 0.3 mg/mL   Intravenous 6 times per day  . imipenem-cilastatin  500 mg Intravenous 4 times per day  . insulin aspart  0-9 Units Subcutaneous 4 times per day  . lip balm  1 application Topical BID  . metronidazole  500 mg Intravenous Q6H  . pantoprazole (PROTONIX) IV  40 mg Intravenous QHS   Infusions:   . Marland KitchenTPN (CLINIMIX-E) Adult    . 0.9 % NaCl with KCl 20 mEq / L 30 mL/hr at 10/21/13 2132  . Marland KitchenTPN (CLINIMIX-E) Adult 83 mL/hr at 10/21/13 1819   And  . fat emulsion 250 mL (10/21/13 1819)  . fat emulsion     PRN: diphenhydrAMINE, diphenhydrAMINE, diphenhydrAMINE, hydrALAZINE, magic mouthwash, menthol-cetylpyridinium, metoprolol, naloxone, ondansetron, ondansetron (ZOFRAN) IV, phenol, sodium chloride, sodium chloride  Insulin Requirements in the past 24 hours:  1 unit Novolog sliding scale (on sensitive-scale coverage q6h)  Nutritional Goals: Per updated RD assessment 7/17: 1950-2150 kCal/day 100-120 grams protein/day Clinimix-E 5/20 at a goal rate of 83 mL/hr + Lipids 20% at 10 mL/hr will provide 100 grams protein/day,  2240 Kcal/day  Current Nutrition:  NPO NG tube in place Clinimix E 5/15 at 83 mL/hr and lipids 20% 33ml/hr  IVF: NS + KCl 20 mEq/L at 30 mL/hr  Assessment: 42 yoM with sigmoid diverticulitis and contained perforation was admitted 6/27 with initial plans for percutaneous drainage, IV antibiotics, and definitive surgery in a few weeks. Developed a new abscess despite non-surgical interventions, however, and ultimately required Hartmann's procedure on 7/16.  Had been either NPO or on liquid diet since admission.  Orders received 7/16 for PICC placement and begin TPN with pharmacy dosing  assistance. PICC was placed 7/17 and TPN was started that evening.  7/20: Beginning Day #4 TPN   Glucose - CBGs at goal < 150mg /dL on Z6Xq6h Novolog sensitive SSI  Renal - SCr and BUN WNL; I/O appears incomplete.   Electrolytes - Na slightly low but stable; other lytes WNL  LFTs - all below ULN  TGs - WNL  Prealbumin - 10.8 (7/17), today's value pending.  Overall demonstrating excellent tolerance of TPN.   Plan:  1. When next bag TPN begins tonight at 1800:  Change to Clinimix-E 5/20 at goal rate of 83 mL/hr  Continue Fat Emulsion 20% at 10 mL/hr 2. Continue CBGs q6h  with Novolog SSI (sensitive scale) coverage q6h. 3. TPN to include multivitamins and trace elements daily. 4. TPN labs every Monday and Thursday. 5. F/U on today's prealbumin when result available. 6. BMet, Mg, Phos tomorrow. 7. Follow daily for resolution of postoperative ileus and ability to begin POs.  [name] Cheo Selvey, PharmD, BCPS Pager: [phone] 10/22/2013  11:07 AM

## 2013-10-23 LAB — GLUCOSE, CAPILLARY
GLUCOSE-CAPILLARY: 148 mg/dL — AB (ref 70–99)
GLUCOSE-CAPILLARY: 124 mg/dL — AB (ref 70–99)
Glucose-Capillary: 123 mg/dL — ABNORMAL HIGH (ref 70–99)

## 2013-10-23 LAB — BASIC METABOLIC PANEL
ANION GAP: 9 (ref 5–15)
BUN: 17 mg/dL (ref 6–23)
CALCIUM: 9.2 mg/dL (ref 8.4–10.5)
CO2: 26 mEq/L (ref 19–32)
Chloride: 97 mEq/L (ref 96–112)
Creatinine, Ser: 0.83 mg/dL (ref 0.50–1.35)
GFR calc Af Amer: >90 mL/min (ref >90)
Glucose, Bld: 146 mg/dL — ABNORMAL HIGH (ref 70–99)
Potassium: 4.3 mEq/L (ref 3.7–5.3)
SODIUM: 132 meq/L — AB (ref 137–147)

## 2013-10-23 LAB — MAGNESIUM: Magnesium: 2.1 mg/dL (ref 1.5–2.5)

## 2013-10-23 LAB — PHOSPHORUS: Phosphorus: 3.1 mg/dL (ref 2.3–4.6)

## 2013-10-23 MED ORDER — TRACE MINERALS CR-CU-F-FE-I-MN-MO-SE-ZN IV SOLN
INTRAVENOUS | Status: AC
  Administered 2013-10-23 (×2): via INTRAVENOUS
  Filled 2013-10-23: qty 2000

## 2013-10-23 MED ORDER — FAT EMULSION 20 % IV EMUL
250.0000 mL | INTRAVENOUS | Status: AC
  Administered 2013-10-23: 250 mL via INTRAVENOUS
  Filled 2013-10-23: qty 250

## 2013-10-23 NOTE — Progress Notes (Signed)
Patient ID: Randy Park, male   DOB: 1960-08-21, 53 y.o.   MRN: [id number]   Subjective: NGT clamped x24hrs.  No n/v.  Pain well controlled.  No stool or flatus in ostomy.  Walking in hallways.   Objective:  Vital signs:  Filed Vitals:   10/23/13 0013 10/23/13 0428 10/23/13 0504 10/23/13 0733  BP:   140/97   Pulse:   97   Temp:   98.4 F (36.9 C)   TempSrc:   Oral   Resp: _0 Height:      Weight:      SpO2: 96%  97% 97%    Last BM Date: 10/19/13  Intake/Output   Yesterday:  07/20 0701 - 07/21 0700 In: 770 [P.O.:50; I.V.:690; NG/GT:30] Out: 2737 [Urine:2575; Emesis/NG output:150; Drains:10; Stool:2] This shift:    I/O last 3 completed shifts: In: 1269 [P.O.:50; I.V.:1099; NG/GT:120] Out: 3662 [Urine:4125; Emesis/NG output:500; Drains:15; Stool:2]     Physical Exam: General: Pt awake/alert/oriented x4 in no acute distress Chest: CTA.  No chest wall pain w good excursion CV:  Pulses intact.  Regular rhythm Abdomen: Soft.  +BS.  distended.  Appropriately tender.  RLQ drain with serous output.  Stoma is pink and viable, serosanguinous output in ostomy.   No evidence of peritonitis.  No incarcerated hernias. Ext:  SCDs BLE.  No mjr edema.  No cyanosis Skin: No petechiae / purpura   Problem List:   Active Problems:   Diverticulitis of large intestine with perforation and abscess    Results:   Labs: Results for orders placed during the hospital encounter of 09/29/13 (from the past 48 hour(s))  GLUCOSE, CAPILLARY     Status: Abnormal   Collection Time    10/21/13 12:28 PM      Result Value Ref Range   Glucose-Capillary 120 (*) 70 - 99 mg/dL  GLUCOSE, CAPILLARY     Status: Abnormal   Collection Time    10/21/13  5:50 PM      Result Value Ref Range   Glucose-Capillary 118 (*) 70 - 99 mg/dL  GLUCOSE, CAPILLARY     Status: Abnormal   Collection Time    10/22/13 12:02 AM      Result Value Ref Range   Glucose-Capillary 114 (*) 70 - 99 mg/dL   COMPREHENSIVE METABOLIC PANEL     Status: Abnormal   Collection Time    10/22/13  5:01 AM      Result Value Ref Range   Sodium 134 (*) 137 - 147 mEq/L   Potassium 4.2  3.7 - 5.3 mEq/L   Chloride 96  96 - 112 mEq/L   CO2 28  19 - 32 mEq/L   Glucose, Bld 123 (*) 70 - 99 mg/dL   BUN 14  6 - 23 mg/dL   Creatinine, Ser 0.79  0.50 - 1.35 mg/dL   Calcium 9.4  8.4 - 10.5 mg/dL   Total Protein 7.3  6.0 - 8.3 g/dL   Albumin 2.9 (*) 3.5 - 5.2 g/dL   AST 16  0 - 37 U/L   ALT 12  0 - 53 U/L   Alkaline Phosphatase 47  39 - 117 U/L   Total Bilirubin 0.5  0.3 - 1.2 mg/dL   GFR calc non Af Amer >90  >90 mL/min   GFR calc Af Amer >90  >90 mL/min   Comment: (NOTE)     The eGFR has been calculated using the CKD EPI equation.  This calculation has not been validated in all clinical situations.     eGFR's persistently <90 mL/min signify possible Chronic Kidney     Disease.   Anion gap 10  5 - 15  MAGNESIUM     Status: None   Collection Time    10/22/13  5:01 AM      Result Value Ref Range   Magnesium 2.1  1.5 - 2.5 mg/dL  PHOSPHORUS     Status: None   Collection Time    10/22/13  5:01 AM      Result Value Ref Range   Phosphorus 3.3  2.3 - 4.6 mg/dL  CBC     Status: Abnormal   Collection Time    10/22/13  5:01 AM      Result Value Ref Range   WBC 8.3  4.0 - 10.5 K/uL   RBC 3.44 (*) 4.22 - 5.81 MIL/uL   Hemoglobin 10.3 (*) 13.0 - 17.0 g/dL   HCT 31.3 (*) 39.0 - 52.0 %   MCV 91.0  78.0 - 100.0 fL   MCH 29.9  26.0 - 34.0 pg   MCHC 32.9  30.0 - 36.0 g/dL   RDW 14.3  11.5 - 15.5 %   Platelets 684 (*) 150 - 400 K/uL  DIFFERENTIAL     Status: Abnormal   Collection Time    10/22/13  5:01 AM      Result Value Ref Range   Neutrophils Relative % 66  43 - 77 %   Neutro Abs 5.5  1.7 - 7.7 K/uL   Lymphocytes Relative 15  12 - 46 %   Lymphs Abs 1.2  0.7 - 4.0 K/uL   Monocytes Relative 16 (*) 3 - 12 %   Monocytes Absolute 1.3 (*) 0.1 - 1.0 K/uL   Eosinophils Relative 4  0 - 5 %    Eosinophils Absolute 0.3  0.0 - 0.7 K/uL   Basophils Relative 1  0 - 1 %   Basophils Absolute 0.1  0.0 - 0.1 K/uL  TRIGLYCERIDES     Status: None   Collection Time    10/22/13  5:01 AM      Result Value Ref Range   Triglycerides 97  <150 mg/dL   Comment: Performed at [place]     Status: Abnormal   Collection Time    10/22/13  5:01 AM      Result Value Ref Range   Prealbumin 16.7 (*) 17.0 - 34.0 mg/dL   Comment: Performed at [place], CAPILLARY     Status: Abnormal   Collection Time    10/22/13  6:25 AM      Result Value Ref Range   Glucose-Capillary 142 (*) 70 - 99 mg/dL  GLUCOSE, CAPILLARY     Status: Abnormal   Collection Time    10/22/13 11:34 AM      Result Value Ref Range   Glucose-Capillary 115 (*) 70 - 99 mg/dL  GLUCOSE, CAPILLARY     Status: Abnormal   Collection Time    10/22/13  5:57 PM      Result Value Ref Range   Glucose-Capillary 121 (*) 70 - 99 mg/dL  GLUCOSE, CAPILLARY     Status: Abnormal   Collection Time    10/22/13 11:37 PM      Result Value Ref Range   Glucose-Capillary 140 (*) 70 - 99 mg/dL  GLUCOSE, CAPILLARY     Status: Abnormal   Collection Time  10/23/13  5:08 AM      Result Value Ref Range   Glucose-Capillary 148 (*) 70 - 99 mg/dL  BASIC METABOLIC PANEL     Status: Abnormal   Collection Time    10/23/13  5:35 AM      Result Value Ref Range   Sodium 132 (*) 137 - 147 mEq/L   Potassium 4.3  3.7 - 5.3 mEq/L   Chloride 97  96 - 112 mEq/L   CO2 26  19 - 32 mEq/L   Glucose, Bld 146 (*) 70 - 99 mg/dL   BUN 17  6 - 23 mg/dL   Creatinine, Ser 0.83  0.50 - 1.35 mg/dL   Calcium 9.2  8.4 - 10.5 mg/dL   GFR calc non Af Amer >90  >90 mL/min   GFR calc Af Amer >90  >90 mL/min   Comment: (NOTE)     The eGFR has been calculated using the CKD EPI equation.     This calculation has not been validated in all clinical situations.     eGFR's persistently <90 mL/min signify possible Chronic Kidney      Disease.   Anion gap 9  5 - 15  MAGNESIUM     Status: None   Collection Time    10/23/13  5:35 AM      Result Value Ref Range   Magnesium 2.1  1.5 - 2.5 mg/dL  PHOSPHORUS     Status: None   Collection Time    10/23/13  5:35 AM      Result Value Ref Range   Phosphorus 3.1  2.3 - 4.6 mg/dL    Imaging / Studies: No results found.  Scheduled Meds: . enoxaparin (LOVENOX) injection  40 mg Subcutaneous Q24H  . fluconazole (DIFLUCAN) IV  200 mg Intravenous Q24H  . HYDROmorphone PCA 0.3 mg/mL   Intravenous 6 times per day  . imipenem-cilastatin  500 mg Intravenous 4 times per day  . insulin aspart  0-9 Units Subcutaneous 4 times per day  . lip balm  1 application Topical BID  . metronidazole  500 mg Intravenous Q6H  . pantoprazole (PROTONIX) IV  40 mg Intravenous QHS   Continuous Infusions: . Marland KitchenTPN (CLINIMIX-E) Adult 83 mL/hr at 10/22/13 1747  . 0.9 % NaCl with KCl 20 mEq / L 30 mL/hr at 10/23/13 0600  . fat emulsion 250 mL (10/22/13 1747)   PRN Meds:.diphenhydrAMINE, diphenhydrAMINE, diphenhydrAMINE, hydrALAZINE, magic mouthwash, menthol-cetylpyridinium, metoprolol, naloxone, ondansetron, ondansetron (ZOFRAN) IV, phenol, sodium chloride, sodium chloride   Antibiotics: Anti-infectives   Start     Dose/Rate Route Frequency Ordered Stop   10/15/13 1400  sulfamethoxazole-trimethoprim (BACTRIM DS) 800-160 MG per tablet 1 tablet  Status:  Discontinued     1 tablet Oral Every 12 hours 10/15/13 1223 10/17/13 0929   10/08/13 1830  fluconazole (DIFLUCAN) IVPB 200 mg     200 mg 100 mL/hr over 60 Minutes Intravenous Every 24 hours 10/08/13 1740     10/08/13 1346  clindamycin (CLEOCIN) 900 mg, gentamicin (GARAMYCIN) 240 mg in sodium chloride 0.9 % 1,000 mL for intraperitoneal lavage  Status:  Discontinued       As needed 10/08/13 1347 10/08/13 1545   10/08/13 1230  gentamicin (GARAMYCIN) 720 mg, clindamycin (CLEOCIN) 2,700 mg in sodium chloride irrigation 0.9 % 3,000 mL irrigation  Status:   Discontinued      Irrigation  Once 10/08/13 1227 10/11/13 0804   10/08/13 1230  gentamicin (GARAMYCIN) 720 mg, clindamycin (CLEOCIN) 2,700 mg  in sodium chloride irrigation 0.9 % 3,000 mL irrigation  Status:  Discontinued      Irrigation  Once 10/08/13 1228 10/11/13 0803   10/08/13 1030  metroNIDAZOLE (FLAGYL) IVPB 500 mg     500 mg 100 mL/hr over 60 Minutes Intravenous Every 6 hours 10/08/13 1025     09/30/13 0600  metroNIDAZOLE (FLAGYL) IVPB 500 mg  Status:  Discontinued     500 mg 100 mL/hr over 60 Minutes Intravenous Every 8 hours 09/30/13 0026 09/30/13 1247   09/30/13 0600  imipenem-cilastatin (PRIMAXIN) 500 mg in sodium chloride 0.9 % 100 mL IVPB     500 mg 200 mL/hr over 30 Minutes Intravenous 4 times per day 09/30/13 0136     09/29/13 2200  imipenem-cilastatin (PRIMAXIN) 500 mg in sodium chloride 0.9 % 100 mL IVPB     500 mg 200 mL/hr over 30 Minutes Intravenous  Once 09/29/13 2157 09/29/13 2353   09/29/13 2200  metroNIDAZOLE (FLAGYL) IVPB 500 mg     500 mg 100 mL/hr over 60 Minutes Intravenous  Once 09/29/13 2157 09/29/13 2320      Assessment/Plan  1. Recurrent Sigmoid diverticulitis with contained perforation admitted 09/29/13  2. New SB dilatation and possible pneumoperitoneum. CT scan show: rim enhancing 5.5 x 6.9 cm fluid collection in the pelvis,10/03/13; reflecting a developing abscess. IR drain placement; 10/05/13, Dr. Anselm Pancoast  3. Sigmoid diverticulitis with interloop & pelvic abscesses; SBO: LAPAROSCOPIC DRAINAGE OF ABDOMINAL ABSCESS X2 with drain placement, LAPAROSCOPIC LYSIS OF ADHESIONS x 64mn. 10/08/2013, SAdin Hector MD.  4. Continued sigmoid perforation, pSBO - POD #5 s/p Ex Lap, Hartmans procedure, colon resection/colostomy, washout, alleviation of pSBO  5. Leukocytosis -resolved 6. Thrombocytosis - 684  7. PCM/TNA  -Continue with TNA -DC NGT and continue NPO until return of bowel function -Continue PCA for pain control -Mobilize -IS -SCD/lovneox -Continue  drain(163mserous output 24h) -primaxin D#23.  Flagyl D#15.  Fluconazole D#15.  ?duration of total therapy    EmErby PianANCommunity First Healthcare Of Illinois Dba Medical Centerurgery Pager [phone]/21/2015 8:09 AM

## 2013-10-23 NOTE — Progress Notes (Signed)
PARENTERAL NUTRITION CONSULT NOTE - Follow-up  Pharmacy Consult for TPN Indication: Diverticulitis with perforation and abscess; s/p Hartmann's Procedure  Allergies  Allergen Reactions  . Amoxicillin Rash    Patient Measurements: Height: 5\' 11"  (180.3 cm) Weight: 190 lb 0.6 oz (86.2 kg) IBW/kg (Calculated) : 75.3   Vital Signs: Temp: 98.4 F (36.9 C) (07/21 0504) Temp src: Oral (07/21 0504) BP: 140/97 mmHg (07/21 0504) Pulse Rate: 97 (07/21 0504) Intake/Output from previous day: 07/20 0701 - 07/21 0700 In: 770 [P.O.:50; I.V.:690; NG/GT:30] Out: 2737 [Urine:2575; Emesis/NG output:150; Drains:10; Stool:2] Intake/Output from this shift:    Labs:  Recent Labs  10/22/13 0501  WBC 8.3  HGB 10.3*  HCT 31.3*  PLT 684*     Recent Labs  10/21/13 0424 10/22/13 0501 10/23/13 0535  NA 135* 134* 132*  K 4.1 4.2 4.3  CL 97 96 97  CO2 30 28 26   GLUCOSE 121* 123* 146*  BUN 9 14 17   CREATININE 0.80 0.79 0.83  CALCIUM 8.9 9.4 9.2  MG 2.0 2.1 2.1  PHOS 3.2 3.3 3.1  PROT  --  7.3  --   ALBUMIN  --  2.9*  --   AST  --  16  --   ALT  --  12  --   ALKPHOS  --  47  --   BILITOT  --  0.5  --   PREALBUMIN  --  16.7*  --   TRIG  --  97  --   Corrected Ca = 10.1  Estimated Creatinine Clearance: 112.1 ml/min (by C-G formula based on Cr of 0.83).    Recent Labs  10/22/13 1757 10/22/13 2337 10/23/13 0508  GLUCAP 121* 140* 148*    Medications:  Scheduled:  . enoxaparin (LOVENOX) injection  40 mg Subcutaneous Q24H  . fluconazole (DIFLUCAN) IV  200 mg Intravenous Q24H  . HYDROmorphone PCA 0.3 mg/mL   Intravenous 6 times per day  . imipenem-cilastatin  500 mg Intravenous 4 times per day  . insulin aspart  0-9 Units Subcutaneous 4 times per day  . lip balm  1 application Topical BID  . metronidazole  500 mg Intravenous Q6H  . pantoprazole (PROTONIX) IV  40 mg Intravenous QHS   Infusions:  . Marland Kitchen.TPN (CLINIMIX-E) Adult 83 mL/hr at 10/22/13 1747  . 0.9 % NaCl with KCl  20 mEq / L 30 mL/hr at 10/23/13 0600  . fat emulsion 250 mL (10/22/13 1747)   PRN: diphenhydrAMINE, diphenhydrAMINE, diphenhydrAMINE, hydrALAZINE, magic mouthwash, menthol-cetylpyridinium, metoprolol, naloxone, ondansetron, ondansetron (ZOFRAN) IV, phenol, sodium chloride, sodium chloride  Insulin Requirements in the past 24 hours:  2 units Novolog sliding scale (on sensitive-scale coverage q6h)  Nutritional Goals: Per updated RD assessment 7/17: 1950-2150 kCal/day 100-120 grams protein/day Clinimix-E 5/20 at a goal rate of 83 mL/hr + Lipids 20% at 10 mL/hr will provide 100 grams protein/day,  2240 Kcal/day  Current Nutrition:  NPO NG tube clamped; being removed today. Clinimix E 5/15 at 83 mL/hr and lipids 20% 7310ml/hr  IVF: NS + KCl 20 mEq/L at 30 mL/hr  Assessment: [age] yoM with sigmoid diverticulitis and contained perforation was admitted 6/27 with initial plans for percutaneous drainage, IV antibiotics, and definitive surgery in a few weeks. Developed a new abscess despite non-surgical interventions, however, and ultimately required Hartmann's procedure on 7/16.  Had been either NPO or on liquid diet since admission.  Orders received 7/16 for PICC placement and begin TPN with pharmacy dosing assistance. PICC was placed 7/17 and  TPN was started that evening.  TPN goal rate/formula were achieved on 7/20.  7/21: Beginning Day #5 TPN   Glucose - CBGs at goal < 150mg /dL on Z6X Novolog sensitive SSI  Renal - SCr and BUN WNL; I/O appears incomplete.   Electrolytes - Na slightly low but stable; other lytes WNL  LFTs - all below ULN  TGs - WNL  Prealbumin - 10.8 (7/17), 16.7 (7/20) - improving.  Overall demonstrating excellent tolerance of TPN on goal formula and rate.   Plan:  1. Continue Clinimix-E 5/20 at goal rate of 83 mL/hr 2. Continue Fat Emulsion 20% at 10 mL/hr 3. Continue CBGs q6h with Novolog SSI (sensitive scale) coverage q6h. 4. TPN to include multivitamins and  trace elements daily. 5. TPN labs every Monday and Thursday. 6. Follow daily for resolution of postoperative ileus and ability to begin POs.  Elie Goody, PharmD, BCPS Pager: [phone] 10/23/2013  8:56 AM

## 2013-10-23 NOTE — Consult Note (Signed)
WOC ostomy follow up Stoma type/location: LMQ colostomy Stomal assessment/size: 1 and 1/2 inches round, pale, budded, os at center Peristomal assessment: intact, clear Treatment options for stomal/peristomal skin: skin barrier ring Output yellow tinged mucous Ostomy pouching: 2pc., 2 and 1/4 inch pouching system with skin barrier ring Education provided: patient observing pouching system change and asking appropriate questions.  Dr. Carolynne Edouardoth in at time of visit. Visit scheduled with wife for Saturday, July 25 at 11am. [place] [facility] [place]: Yes WOC nursing team will follow, and will remain available to this patient, the nursing, surgical and medical teams.   Thanks, Ladona MowLaurie Dia Donate, MSN, RN, GNP, [place], CWON-AP [phone]

## 2013-10-24 LAB — GLUCOSE, CAPILLARY
GLUCOSE-CAPILLARY: 140 mg/dL — AB (ref 70–99)
GLUCOSE-CAPILLARY: 152 mg/dL — AB (ref 70–99)
Glucose-Capillary: 124 mg/dL — ABNORMAL HIGH (ref 70–99)
Glucose-Capillary: 134 mg/dL — ABNORMAL HIGH (ref 70–99)
Glucose-Capillary: 142 mg/dL — ABNORMAL HIGH (ref 70–99)

## 2013-10-24 MED ORDER — FAT EMULSION 20 % IV EMUL
250.0000 mL | INTRAVENOUS | Status: DC
  Administered 2013-10-24: 250 mL via INTRAVENOUS
  Filled 2013-10-24: qty 250

## 2013-10-24 MED ORDER — HYDROMORPHONE HCL PF 1 MG/ML IJ SOLN
1.0000 mg | INTRAMUSCULAR | Status: DC | PRN
  Administered 2013-10-24 – 2013-10-25 (×5): 1 mg via INTRAVENOUS
  Filled 2013-10-24 (×5): qty 1

## 2013-10-24 MED ORDER — TRACE MINERALS CR-CU-F-FE-I-MN-MO-SE-ZN IV SOLN
INTRAVENOUS | Status: DC
  Administered 2013-10-24: 18:00:00 via INTRAVENOUS
  Filled 2013-10-24: qty 2000

## 2013-10-24 MED ORDER — OXYCODONE-ACETAMINOPHEN 5-325 MG PO TABS
1.0000 | ORAL_TABLET | ORAL | Status: DC | PRN
  Administered 2013-10-24 – 2013-10-27 (×10): 2 via ORAL
  Filled 2013-10-24 (×11): qty 2

## 2013-10-24 NOTE — Progress Notes (Signed)
Seen and agree  

## 2013-10-24 NOTE — Progress Notes (Signed)
Patient ID: Randy Park, male   DOB: February 04, 1961, 53 y.o.   MRN: [id number]   Subjective: Ostomy finally putting out.  Pain well controlled.  Voiding. VSS.  Afebrile.    Objective:  Vital signs:  Filed Vitals:   10/23/13 2219 10/24/13 0038 10/24/13 0427 10/24/13 0547  BP: 121/82   118/72  Pulse: 96   97  Temp: 98.1 F (36.7 C)   98.3 F (36.8 C)  TempSrc: Oral   Oral  Resp: $Remo'16 12 15 16  'zfDot$ Height:      Weight:      SpO2: 96% 96%  97%    Last BM Date: 10/19/13  Intake/Output   Yesterday:  07/21 0701 - 07/22 0700 In: 240 [I.V.:240] Out: 2530 [Urine:2525; Drains:5] This shift:    I/O last 3 completed shifts: In: 930 [I.V.:930] Out: 4431 [Urine:4050; Drains:15]    Physical Exam:  General: Pt awake/alert/oriented x4 in no acute distress  Chest: CTA. No chest wall pain w good excursion  CV: Pulses intact. Regular rhythm  Abdomen: Soft. +BS. nondistended. Appropriately tender. RLQ drain with serous output. Stoma is pink and viable, stool and air in ostomy bag.  Midline wound is beefy red. No evidence of peritonitis. No incarcerated hernias.  Ext: SCDs BLE. No mjr edema. No cyanosis  Skin: No petechiae / purpura   Problem List:   Active Problems:   Diverticulitis of large intestine with perforation and abscess    Results:   Labs: Results for orders placed during the hospital encounter of 09/29/13 (from the past 48 hour(s))  GLUCOSE, CAPILLARY     Status: Abnormal   Collection Time    10/22/13 11:34 AM      Result Value Ref Range   Glucose-Capillary 115 (*) 70 - 99 mg/dL  GLUCOSE, CAPILLARY     Status: Abnormal   Collection Time    10/22/13  5:57 PM      Result Value Ref Range   Glucose-Capillary 121 (*) 70 - 99 mg/dL  GLUCOSE, CAPILLARY     Status: Abnormal   Collection Time    10/22/13 11:37 PM      Result Value Ref Range   Glucose-Capillary 140 (*) 70 - 99 mg/dL  GLUCOSE, CAPILLARY     Status: Abnormal   Collection Time    10/23/13  5:08 AM       Result Value Ref Range   Glucose-Capillary 148 (*) 70 - 99 mg/dL  BASIC METABOLIC PANEL     Status: Abnormal   Collection Time    10/23/13  5:35 AM      Result Value Ref Range   Sodium 132 (*) 137 - 147 mEq/L   Potassium 4.3  3.7 - 5.3 mEq/L   Chloride 97  96 - 112 mEq/L   CO2 26  19 - 32 mEq/L   Glucose, Bld 146 (*) 70 - 99 mg/dL   BUN 17  6 - 23 mg/dL   Creatinine, Ser 0.83  0.50 - 1.35 mg/dL   Calcium 9.2  8.4 - 10.5 mg/dL   GFR calc non Af Amer >90  >90 mL/min   GFR calc Af Amer >90  >90 mL/min   Comment: (NOTE)     The eGFR has been calculated using the CKD EPI equation.     This calculation has not been validated in all clinical situations.     eGFR's persistently <90 mL/min signify possible Chronic Kidney     Disease.   Anion gap 9  5 - 15  MAGNESIUM     Status: None   Collection Time    10/23/13  5:35 AM      Result Value Ref Range   Magnesium 2.1  1.5 - 2.5 mg/dL  PHOSPHORUS     Status: None   Collection Time    10/23/13  5:35 AM      Result Value Ref Range   Phosphorus 3.1  2.3 - 4.6 mg/dL  GLUCOSE, CAPILLARY     Status: Abnormal   Collection Time    10/23/13 12:15 PM      Result Value Ref Range   Glucose-Capillary 124 (*) 70 - 99 mg/dL  GLUCOSE, CAPILLARY     Status: Abnormal   Collection Time    10/23/13  6:25 PM      Result Value Ref Range   Glucose-Capillary 123 (*) 70 - 99 mg/dL  GLUCOSE, CAPILLARY     Status: Abnormal   Collection Time    10/24/13 12:25 AM      Result Value Ref Range   Glucose-Capillary 142 (*) 70 - 99 mg/dL  GLUCOSE, CAPILLARY     Status: Abnormal   Collection Time    10/24/13  5:33 AM      Result Value Ref Range   Glucose-Capillary 124 (*) 70 - 99 mg/dL    Imaging / Studies: No results found.  Scheduled Meds: . enoxaparin (LOVENOX) injection  40 mg Subcutaneous Q24H  . imipenem-cilastatin  500 mg Intravenous 4 times per day  . insulin aspart  0-9 Units Subcutaneous 4 times per day  . lip balm  1 application Topical BID   . metronidazole  500 mg Intravenous Q6H  . pantoprazole (PROTONIX) IV  40 mg Intravenous QHS   Continuous Infusions: . Marland KitchenTPN (CLINIMIX-E) Adult 83 mL/hr at 10/23/13 1741  . 0.9 % NaCl with KCl 20 mEq / L 30 mL/hr at 10/23/13 0600  . fat emulsion 250 mL (10/23/13 1740)   PRN Meds:.diphenhydrAMINE, hydrALAZINE, HYDROmorphone (DILAUDID) injection, magic mouthwash, menthol-cetylpyridinium, metoprolol, ondansetron, oxyCODONE-acetaminophen, phenol, sodium chloride   Antibiotics: Anti-infectives   Start     Dose/Rate Route Frequency Ordered Stop   10/15/13 1400  sulfamethoxazole-trimethoprim (BACTRIM DS) 800-160 MG per tablet 1 tablet  Status:  Discontinued     1 tablet Oral Every 12 hours 10/15/13 1223 10/17/13 0929   10/08/13 1830  fluconazole (DIFLUCAN) IVPB 200 mg     200 mg 100 mL/hr over 60 Minutes Intravenous Every 24 hours 10/08/13 1740 10/23/13 2029   10/08/13 1346  clindamycin (CLEOCIN) 900 mg, gentamicin (GARAMYCIN) 240 mg in sodium chloride 0.9 % 1,000 mL for intraperitoneal lavage  Status:  Discontinued       As needed 10/08/13 1347 10/08/13 1545   10/08/13 1230  gentamicin (GARAMYCIN) 720 mg, clindamycin (CLEOCIN) 2,700 mg in sodium chloride irrigation 0.9 % 3,000 mL irrigation  Status:  Discontinued      Irrigation  Once 10/08/13 1227 10/11/13 0804   10/08/13 1230  gentamicin (GARAMYCIN) 720 mg, clindamycin (CLEOCIN) 2,700 mg in sodium chloride irrigation 0.9 % 3,000 mL irrigation  Status:  Discontinued      Irrigation  Once 10/08/13 1228 10/11/13 0803   10/08/13 1030  metroNIDAZOLE (FLAGYL) IVPB 500 mg     500 mg 100 mL/hr over 60 Minutes Intravenous Every 6 hours 10/08/13 1025 10/31/13 0755   09/30/13 0600  metroNIDAZOLE (FLAGYL) IVPB 500 mg  Status:  Discontinued     500 mg 100 mL/hr over 60 Minutes Intravenous  Every 8 hours 09/30/13 0026 09/30/13 1247   09/30/13 0600  imipenem-cilastatin (PRIMAXIN) 500 mg in sodium chloride 0.9 % 100 mL IVPB     500 mg 200 mL/hr over 30  Minutes Intravenous 4 times per day 09/30/13 0136 10/24/13 1759   09/29/13 2200  imipenem-cilastatin (PRIMAXIN) 500 mg in sodium chloride 0.9 % 100 mL IVPB     500 mg 200 mL/hr over 30 Minutes Intravenous  Once 09/29/13 2157 09/29/13 2353   09/29/13 2200  metroNIDAZOLE (FLAGYL) IVPB 500 mg     500 mg 100 mL/hr over 60 Minutes Intravenous  Once 09/29/13 2157 09/29/13 2320       Assessment/Plan  1. Recurrent Sigmoid diverticulitis with contained perforation admitted 09/29/13  2. New SB dilatation and possible pneumoperitoneum. CT scan show: rim enhancing 5.5 x 6.9 cm fluid collection in the pelvis,10/03/13; reflecting a developing abscess. IR drain placement; 10/05/13, Dr. Anselm Pancoast  3. Sigmoid diverticulitis with interloop & pelvic abscesses; SBO: LAPAROSCOPIC DRAINAGE OF ABDOMINAL ABSCESS X2 with drain placement, LAPAROSCOPIC LYSIS OF ADHESIONS x 42min. 10/08/2013, Adin Hector, MD.  4. Continued sigmoid perforation, pSBO - POD #6 s/p Ex Lap, Hartmans procedure, colon resection/colostomy, washout, alleviation of pSBO  5. Leukocytosis -resolved  6. Thrombocytosis - 684  7. PCM/TNA  -clear liquid diet, advance slowly -Continue with TNA, will wean if able to tolerate liquids  -DC PCA -Mobilize  -IS  -SCD/lovneox  -DC JP drain  -primaxin D#24. Flagyl D#16. Fluconazole D#16. Stop after todays dose -repeat labs in AM  [facility], [facility] Pager [phone] Office [phone]  10/24/2013 8:18 AM

## 2013-10-24 NOTE — Progress Notes (Signed)
PARENTERAL NUTRITION CONSULT NOTE - Follow-up  Pharmacy Consult for TPN Indication: Diverticulitis with perforation and abscess; s/p Hartmann's Procedure  Allergies  Allergen Reactions  . Amoxicillin Rash    Patient Measurements: Height: 5\' 11"  (180.3 cm) Weight: 190 lb 0.6 oz (86.2 kg) IBW/kg (Calculated) : 75.3   Vital Signs: Temp: 98.3 F (36.8 C) (07/22 0547) Temp src: Oral (07/22 0547) BP: 118/72 mmHg (07/22 0547) Pulse Rate: 97 (07/22 0547) Intake/Output from previous day: 07/21 0701 - 07/22 0700 In: 240 [I.V.:240] Out: 2530 [Urine:2525; Drains:5] Intake/Output from this shift: Total I/O In: 120 [P.O.:120] Out: 450 [Urine:450]  Labs:  Recent Labs  10/22/13 0501  WBC 8.3  HGB 10.3*  HCT 31.3*  PLT 684*     Recent Labs  10/22/13 0501 10/23/13 0535  NA 134* 132*  K 4.2 4.3  CL 96 97  CO2 28 26  GLUCOSE 123* 146*  BUN 14 17  CREATININE 0.79 0.83  CALCIUM 9.4 9.2  MG 2.1 2.1  PHOS 3.3 3.1  PROT 7.3  --   ALBUMIN 2.9*  --   AST 16  --   ALT 12  --   ALKPHOS 47  --   BILITOT 0.5  --   PREALBUMIN 16.7*  --   TRIG 97  --   Corrected Ca = 10.1  Estimated Creatinine Clearance: 112.1 ml/min (by C-G formula based on Cr of 0.83).    Recent Labs  10/23/13 1825 10/24/13 0025 10/24/13 0533  GLUCAP 123* 142* 124*    Medications:  Scheduled:  . enoxaparin (LOVENOX) injection  40 mg Subcutaneous Q24H  . imipenem-cilastatin  500 mg Intravenous 4 times per day  . insulin aspart  0-9 Units Subcutaneous 4 times per day  . lip balm  1 application Topical BID  . metronidazole  500 mg Intravenous Q6H  . pantoprazole (PROTONIX) IV  40 mg Intravenous QHS   Infusions:  . Marland KitchenTPN (CLINIMIX-E) Adult 83 mL/hr at 10/23/13 1741  . Marland KitchenTPN (CLINIMIX-E) Adult    . 0.9 % NaCl with KCl 20 mEq / L 30 mL/hr at 10/23/13 0600  . fat emulsion 250 mL (10/23/13 1740)  . fat emulsion     PRN: diphenhydrAMINE, hydrALAZINE, HYDROmorphone (DILAUDID) injection, magic  mouthwash, menthol-cetylpyridinium, metoprolol, ondansetron, oxyCODONE-acetaminophen, phenol, sodium chloride  Insulin Requirements in the past 24 hours:  3 units Novolog sliding scale (on sensitive-scale coverage q6h)  Nutritional Goals: Per updated RD assessment 7/17: 1950-2150 kCal/day 100-120 grams protein/day Clinimix-E 5/20 at a goal rate of 83 mL/hr + Lipids 20% at 10 mL/hr will provide 100 grams protein/day,  2240 Kcal/day  Current Nutrition:  Clear liquid diet NG tube clamped; Clinimix E 5/15 at 83 mL/hr and lipids 20% 5ml/hr  IVF: NS + KCl 20 mEq/L at 30 mL/hr  Assessment: Randy Park with sigmoid diverticulitis and contained perforation was admitted 6/27 with initial plans for percutaneous drainage, IV antibiotics, and definitive surgery in a few weeks. Developed a new abscess despite non-surgical interventions, however, and ultimately required Hartmann's procedure on 7/16.  Had been either NPO or on liquid diet since admission.  Orders received 7/16 for PICC placement and begin TPN with pharmacy dosing assistance. PICC was placed 7/17 and TPN was started that evening.  TPN goal rate/formula were achieved on 7/20.  7/22: Beginning Day #6 TPN   Glucose - CBGs at goal < 150mg /dL on R6E Novolog sensitive SSI  Renal - SCr and BUN WNL; I/O appears incomplete.   Electrolytes - Na slightly low  but stable; other lytes WNL  LFTs - all below ULN  TGs - WNL  Prealbumin - 10.8 (7/17), 16.7 (7/20) - improving.  Overall demonstrating excellent tolerance of TPN on goal formula and rate.   Plan:  1. Continue Clinimix-E 5/20 at goal rate of 83 mL/hr 2. Continue Fat Emulsion 20% at 10 mL/hr 3. Continue CBGs q6h with Novolog SSI (sensitive scale) coverage q6h. 4. TPN to include multivitamins and trace elements daily. 5. TPN labs every Monday and Thursday. 6.  Follow po intake and beginning of TNA wean  Arley Phenixllen Nicey Krah RPh 10/24/2013, 10:57 AM Pager [phone]

## 2013-10-24 NOTE — Progress Notes (Signed)
NUTRITION FOLLOW UP  Intervention:   - TPN per pharmacy - Diet advancement per MD - RD to monitor plan of care   Nutrition Dx:   Inadequate oral intake related to inability to eat as evidenced by NPO - ongoing now related to clear liquid diet as evidenced by diet order.   Goal:   TPN to meet >90% of estimated nutritional needs - met   New goal: Advance diet as tolerated to soft diet   Monitor:   Weights, labs, diet advancement, TPN, colostomy output   Assessment:   Pt presents with lower abdominal pain that started Friday. 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 He went to local hospital where he was started on cipro and flagyl. CT shows sigmoid diverticulitis with contained perforation and partial small bowel obstruction per MD notes.   6/29: -Pt discussed during multidisciplinary rounds.  -Met with pt who reports eating the "wrong" kind of foods PTA, 4-5 meals/day of mostly fast foods  -States his weight has been stable  -Denies any diarrhea since admission, last emesis was Friday, abdominal pain reportedly has remained the same since admission  -Appeared uncomfortable, did not perform nutrition focused physical exam  -Starting clear liquids today  7/6: -Had CT guided drain placement in pelvic fluid collection from right transgluteal approach 7/3 -Per surgeon's notes, pt had CT scan with interloop abscess w gas - much larger -Still with abdominal distention this morning per PA notes -Currently in OR for open exploration of abdomen with drainage of abscesses  7/13: -Tolerating soft diet with early satiety and decreased appetite. - Intake remains poor.  Ate 1/2 sandwich for lunch. - Patient reported to PA, increased pain with BM and urination and limiting intake secondary to pain.  7/17: - CT on 7/14 showed "new abscess cavity with a large amount of air and some fluid approximately 8 x 10 cm in the pelvis. Previous interloop abscesses had resolved" - Had colon resection with  Hartmanns procedure and colostomy placement 7/16 - No N/V reported today, has NGT in place, plan is for bowel rest and starting TPN - No colostomy output charted so far today  7/22: - NGT d/c yesterday - Ostomy functioning  - Met with pt who reports tolerating clear liquid diet - denies pain or nausea after eating - Not interested in [employer], says that he's been on it before and it tears up his stomach - Discussed diet intake with pharmacist   TPN: Clinimix-E 5/20 at a goal rate of 83 mL/hr + Lipids 20% at 10 mL/hr will provide 100 grams protein/day, 2240 Kcal/day - meeting 104% estimated calorie needs, 100% estimated protein needs   PALB low but improving  Triglycerides WNL CBGs < 150 mg/dL    Height: Ht Readings from Last 1 Encounters:  09/29/13 $RemoveB'5\' 11"'MomOjflI$  (1.803 m)    Weight Status:   Wt Readings from Last 1 Encounters:  10/18/13 190 lb 0.6 oz (86.2 kg)  Admit wt         190 lb (86.1 kg)  Re-estimated needs:  Kcal: 1950-2150  Protein: 100-120g  Fluid: 1.9-2.1L/day   Skin: Intact    Diet Order: Clear Liquid   Intake/Output Summary (Last 24 hours) at 10/24/13 1228 Last data filed at 10/24/13 0955  Gross per 24 hour  Intake    240 ml  Output   2255 ml  Net  -2015 ml    Last BM: 7/17   Labs:   Recent Labs Lab 10/21/13  0424 10/22/13 0501 10/23/13 0535  NA 135* 134* 132*  K 4.1 4.2 4.3  CL 97 96 97  CO2 $Re'30 28 26  'UJW$ BUN $R'9 14 17  'WS$ CREATININE 0.80 0.79 0.83  CALCIUM 8.9 9.4 9.2  MG 2.0 2.1 2.1  PHOS 3.2 3.3 3.1  GLUCOSE 121* 123* 146*    CBG (last 3)   Recent Labs  10/23/13 1825 10/24/13 0025 10/24/13 0533  GLUCAP 123* 142* 124*    Scheduled Meds: . enoxaparin (LOVENOX) injection  40 mg Subcutaneous Q24H  . insulin aspart  0-9 Units Subcutaneous 4 times per day  . lip balm  1 application Topical BID  . metronidazole  500 mg Intravenous Q6H  . pantoprazole (PROTONIX) IV  40 mg Intravenous QHS    Carlis Stable MS, RD, LDN [phone]  Pager [phone] Weekend/After Hours Pager

## 2013-10-25 LAB — CBC
HCT: 29.4 % — ABNORMAL LOW (ref 39.0–52.0)
Hemoglobin: 9.6 g/dL — ABNORMAL LOW (ref 13.0–17.0)
MCH: 30.1 pg (ref 26.0–34.0)
MCHC: 32.7 g/dL (ref 30.0–36.0)
MCV: 92.2 fL (ref 78.0–100.0)
PLATELETS: 645 10*3/uL — AB (ref 150–400)
RBC: 3.19 MIL/uL — ABNORMAL LOW (ref 4.22–5.81)
RDW: 14.7 % (ref 11.5–15.5)
WBC: 6.5 10*3/uL (ref 4.0–10.5)

## 2013-10-25 LAB — GLUCOSE, CAPILLARY: Glucose-Capillary: 124 mg/dL — ABNORMAL HIGH (ref 70–99)

## 2013-10-25 LAB — COMPREHENSIVE METABOLIC PANEL
ALT: 9 U/L (ref 0–53)
ANION GAP: 8 (ref 5–15)
AST: 15 U/L (ref 0–37)
Albumin: 2.9 g/dL — ABNORMAL LOW (ref 3.5–5.2)
Alkaline Phosphatase: 53 U/L (ref 39–117)
BILIRUBIN TOTAL: 0.4 mg/dL (ref 0.3–1.2)
BUN: 17 mg/dL (ref 6–23)
CALCIUM: 9 mg/dL (ref 8.4–10.5)
CO2: 26 meq/L (ref 19–32)
CREATININE: 0.87 mg/dL (ref 0.50–1.35)
Chloride: 101 mEq/L (ref 96–112)
Glucose, Bld: 128 mg/dL — ABNORMAL HIGH (ref 70–99)
Potassium: 4.5 mEq/L (ref 3.7–5.3)
Sodium: 135 mEq/L — ABNORMAL LOW (ref 137–147)
Total Protein: 7 g/dL (ref 6.0–8.3)

## 2013-10-25 LAB — MAGNESIUM: MAGNESIUM: 2.2 mg/dL (ref 1.5–2.5)

## 2013-10-25 LAB — PHOSPHORUS: PHOSPHORUS: 3.1 mg/dL (ref 2.3–4.6)

## 2013-10-25 MED ORDER — HYDROMORPHONE HCL PF 1 MG/ML IJ SOLN
1.0000 mg | INTRAMUSCULAR | Status: DC | PRN
  Administered 2013-10-25 – 2013-10-26 (×3): 1 mg via INTRAVENOUS
  Filled 2013-10-25 (×3): qty 1

## 2013-10-25 MED ORDER — ADULT MULTIVITAMIN W/MINERALS CH
1.0000 | ORAL_TABLET | Freq: Every day | ORAL | Status: DC
  Administered 2013-10-26 – 2013-10-27 (×2): 1 via ORAL
  Filled 2013-10-25 (×2): qty 1

## 2013-10-25 MED ORDER — FAT EMULSION 20 % IV EMUL
250.0000 mL | INTRAVENOUS | Status: DC
  Filled 2013-10-25: qty 250

## 2013-10-25 MED ORDER — TRACE MINERALS CR-CU-F-FE-I-MN-MO-SE-ZN IV SOLN
INTRAVENOUS | Status: DC
  Filled 2013-10-25: qty 2000

## 2013-10-25 MED ORDER — ENSURE COMPLETE PO LIQD
237.0000 mL | Freq: Three times a day (TID) | ORAL | Status: DC
  Administered 2013-10-25 – 2013-10-27 (×6): 237 mL via ORAL

## 2013-10-25 NOTE — Consult Note (Signed)
WOC ostomy follow up Stoma type/location: LLQ, colostomy Stomal assessment/size: pouch change not completed today, appears pink through pouch Peristomal assessment: pouch intact Output brown, liquid output Ostomy pouching: 2pc.2 1/4" pouch intact  Education provided: patient assisted with pouch empty, demonstrated cleaning the pouch opening with toilet paper. Pt closed lock and roll closure independently.  Pt verbalized gas release by "burping" the pouch.  Pt has interest in possible use of closed end pouches.  I did bring one to the room that is a one pc and we discussed the use of 2pc closed end pouches.  He does not seem very interested in emptying the pouch.  I explained insurance coverage and how the amount of pouches may limit the ability to "Just throw them away".   [facility]  WOC has scheduled educational session with wife since she can not be here during the week. Saturday at 11am with wife.  Discussed this with patient with [facility]CS team, plans to possible dc tomorrow am.  Will hold off on discharge until teaching session can be complotted with WOC nurse and wife on Saturday am.  Enrolled patient in [employer] Secure [employer] Discharge program: Yes  WOC team will follow along with you for ostomy care and teaching [place]Melody Brizeida Mcmurry RN,CWOCN [phone]

## 2013-10-25 NOTE — Progress Notes (Signed)
PARENTERAL NUTRITION CONSULT NOTE - Follow-up  Pharmacy Consult for TPN Indication: Diverticulitis with perforation and abscess; s/p Hartmann's Procedure  Allergies  Allergen Reactions  . Amoxicillin Rash    Patient Measurements: Height: 5\' 11"  (180.3 cm) Weight: 190 lb 0.6 oz (86.2 kg) IBW/kg (Calculated) : 75.3   Vital Signs: Temp: 98.1 F (36.7 C) (07/23 0612) Temp src: Oral (07/23 0612) BP: 106/75 mmHg (07/23 0612) Pulse Rate: 96 (07/23 0612) Intake/Output from previous day: 07/22 0701 - 07/23 0700 In: 480 [P.O.:480] Out: 2500 [Urine:2350; Stool:150] Intake/Output from this shift:    Labs:  Recent Labs  10/25/13 0455  WBC 6.5  HGB 9.6*  HCT 29.4*  PLT 645*     Recent Labs  10/23/13 0535 10/25/13 0455  NA 132* 135*  K 4.3 4.5  CL 97 101  CO2 26 26  GLUCOSE 146* 128*  BUN 17 17  CREATININE 0.83 0.87  CALCIUM 9.2 9.0  MG 2.1 2.2  PHOS 3.1 3.1  PROT  --  7.0  ALBUMIN  --  2.9*  AST  --  15  ALT  --  9  ALKPHOS  --  53  BILITOT  --  0.4  Corrected Ca = 9.9  Estimated Creatinine Clearance: 107 ml/min (by C-G formula based on Cr of 0.87).    Recent Labs  10/24/13 1804 10/24/13 2341 10/25/13 0609  GLUCAP 140* 152* 124*    Medications:  Scheduled:  . enoxaparin (LOVENOX) injection  40 mg Subcutaneous Q24H  . insulin aspart  0-9 Units Subcutaneous 4 times per day  . lip balm  1 application Topical BID  . metronidazole  500 mg Intravenous Q6H  . pantoprazole (PROTONIX) IV  40 mg Intravenous QHS   Infusions:  . Marland KitchenTPN (CLINIMIX-E) Adult 83 mL/hr at 10/24/13 1756  . 0.9 % NaCl with KCl 20 mEq / L 30 mL/hr at 10/23/13 0600  . fat emulsion 250 mL (10/24/13 1756)   PRN: diphenhydrAMINE, hydrALAZINE, HYDROmorphone (DILAUDID) injection, magic mouthwash, menthol-cetylpyridinium, metoprolol, ondansetron, oxyCODONE-acetaminophen, phenol, sodium chloride  Insulin Requirements in the past 24 hours:  5 units Novolog sliding scale (on sensitive-scale  coverage q6h)  Nutritional Goals: Per updated RD assessment 7/17: 1950-2150 kCal/day 100-120 grams protein/day Clinimix-E 5/20 at a goal rate of 83 mL/hr + Lipids 20% at 10 mL/hr will provide 100 grams protein/day,  2240 Kcal/day  Current Nutrition:  Clear liquid diet (started 7/22) Clinimix E 5/15 at 83 mL/hr and lipids 20% 41ml/hr (goal rate achieved 7/20)  IVF: NS + KCl 20 mEq/L at 30 mL/hr  Assessment: [name] with sigmoid diverticulitis and contained perforation was admitted 6/27 with initial plans for percutaneous drainage, IV antibiotics, and definitive surgery in a few weeks. Developed a new abscess despite non-surgical interventions, however, and ultimately required Hartmann's procedure on 7/16.  Had been either NPO or on liquid diet since admission.  Orders received 7/16 for PICC placement and begin TPN with pharmacy dosing assistance. PICC was placed 7/17 and TPN was started that evening.  TPN goal rate/formula were achieved on 7/20.   NG tube was removed 7/21. Clear liquids were started 7/22.  7/23: Beginning Day #7 TPN   Glucose - CBGs mostly < 150mg /dL on Z6X Novolog sensitive SSI  Renal - SCr and BUN WNL, stable; I/O appears incomplete.   Electrolytes - Na slightly low but improved; other lytes WNL  LFTs - all below ULN  TGs - WNL on 7/20  Prealbumin - 10.8 (7/17), 16.7 (7/20) - improving.  Overall demonstrating excellent tolerance of TPN on goal formula and rate. Patient states he is taking clear liquids without difficulty.   Plan:  1. Continue Clinimix-E 5/20 at goal rate of 83 mL/hr 2. Continue Fat Emulsion 20% at 10 mL/hr 3. Continue CBGs q6h with Novolog SSI (sensitive scale) coverage q6h. 4. TPN to include multivitamins and trace elements daily. 5. TPN labs every Monday and Thursday. 6.  Follow PO intake, diet advancement, and await word on when to start weaning TNA.  Randy Park, PharmD, BCPS Pager: [phone] 10/25/2013  7:45  AM

## 2013-10-25 NOTE — Progress Notes (Signed)
NUTRITION FOLLOW UP  Intervention:   - Ensure Complete TID, provided pt with Ensure coupons  - Used teach back method to educate pt on low fiber diet for new colostomy and advancing fibrous foods back in gradually. Encouraged pt to chew food thoroughly and avoid foods that could block opening of stoma. Emphasized importance of adequate hydration. Handouts provided with RD contact information.  - RD to monitor plan of care   Nutrition Dx:   Inadequate oral intake clear liquid diet as evidenced by diet order - ongoing related to poor appetite and gas pain as evidenced by pt report.   Goal:   1. TPN to meet >90% of estimated nutritional needs - not met, in process of being weaned 2. Advance diet as tolerated to soft diet - met   New goal: Pt to consume >90% of meals/supplements   Monitor:   Weights, labs, intake, colostomy output   Assessment:   Pt presents with lower abdominal pain that started Friday. 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 He went to local hospital where he was started on cipro and flagyl. CT shows sigmoid diverticulitis with contained perforation and partial small bowel obstruction per MD notes.   6/29: -Pt discussed during multidisciplinary rounds.  -Met with pt who reports eating the "wrong" kind of foods PTA, 4-5 meals/day of mostly fast foods  -States his weight has been stable  -Denies any diarrhea since admission, last emesis was Friday, abdominal pain reportedly has remained the same since admission  -Appeared uncomfortable, did not perform nutrition focused physical exam  -Starting clear liquids today  7/6: -Had CT guided drain placement in pelvic fluid collection from right transgluteal approach 7/3 -Per surgeon's notes, pt had CT scan with interloop abscess w gas - much larger -Still with abdominal distention this morning per PA notes -Currently in OR for open exploration of abdomen with drainage of abscesses  7/13: -Tolerating soft diet with early  satiety and decreased appetite. - Intake remains poor.  Ate 1/2 sandwich for lunch. - Patient reported to PA, increased pain with BM and urination and limiting intake secondary to pain.  7/17: - CT on 7/14 showed "new abscess cavity with a large amount of air and some fluid approximately 8 x 10 cm in the pelvis. 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 - No N/V reported today, has NGT in place, plan is for bowel rest and starting TPN - No colostomy output charted so far today  7/22: - NGT d/c yesterday - Ostomy functioning  - Met with pt who reports tolerating clear liquid diet - denies pain or nausea after eating - Not interested in [employer], says that he's been on it before and it tears up his stomach - Discussed diet intake with pharmacist   7/23: - Diet advanced to soft today - Plan is to wean off TPN with likely d/c tomorrow  - Met with pt who reports eating some of grits and beef broth this morning, didn't eat all of it due to having gas pain - Ate a few bites of chocolate pudding on lunch tray - Agreeable to getting Ensure Complete, will order  - Having good ostomy output per NP  TPN currently: Clinimix 5/20 at 48m/hr with Lipids 20% at 5 mL/hr  Provides 1506 calories, 72g protein meeting 77% estimated calorie needs, 72% estimated protein needs     Height: Ht Readings from Last 1 Encounters:  09/29/13 _0  (1.803 m)    Weight Status:   Wt Readings from Last 1 Encounters:  10/18/13 190 lb 0.6 oz (86.2 kg)  Admit wt         190 lb (86.1 kg)  Re-estimated needs:  Kcal: 1950-2150  Protein: 100-120g  Fluid: 1.9-2.1L/day   Skin: Intact    Diet Order: Criss Rosales   Intake/Output Summary (Last 24 hours) at 10/25/13 1323 Last data filed at 10/25/13 1000  Gross per 24 hour  Intake    972 ml  Output   2250 ml  Net  -1278 ml    Last BM: 7/17   Labs:   Recent Labs Lab  10/22/13 0501 10/23/13 0535 10/25/13 0455  NA 134* 132* 135*  K 4.2 4.3 4.5  CL 96 97 101  CO2 _1 BUN _2 CREATININE 0.79 0.83 0.87  CALCIUM 9.4 9.2 9.0  MG 2.1 2.1 2.2  PHOS 3.3 3.1 3.1  GLUCOSE 123* 146* 128*    CBG (last 3)   Recent Labs  10/24/13 1804 10/24/13 2341 10/25/13 0609  GLUCAP 140* 152* 124*    Scheduled Meds: . enoxaparin (LOVENOX) injection  40 mg Subcutaneous Q24H  . lip balm  1 application Topical BID  . [START ON 10/26/2013] multivitamin with minerals  1 tablet Oral Daily  . pantoprazole (PROTONIX) IV  40 mg Intravenous QHS    Carlis Stable MS, RD, [place] [phone] Pager [phone] Weekend/After Hours Pager

## 2013-10-25 NOTE — Progress Notes (Signed)
PARENTERAL NUTRITION CONSULT NOTE - Follow-up  Pharmacy Consult for TPN Indication: Diverticulitis with perforation and abscess; s/p Hartmann's Procedure  Allergies  Allergen Reactions  . Amoxicillin Rash    Patient Measurements: Height: 5\' 11"  (180.3 cm) Weight: 190 lb 0.6 oz (86.2 kg) IBW/kg (Calculated) : 75.3   Vital Signs: Temp: 98.1 F (36.7 C) (07/23 0612) Temp src: Oral (07/23 0612) BP: 106/75 mmHg (07/23 0612) Pulse Rate: 96 (07/23 0612) Intake/Output from previous day: 07/22 0701 - 07/23 0700 In: 480 [P.O.:480] Out: 2500 [Urine:2350; Stool:150] Intake/Output from this shift:    Labs:  Recent Labs  10/25/13 0455  WBC 6.5  HGB 9.6*  HCT 29.4*  PLT 645*     Recent Labs  10/23/13 0535 10/25/13 0455  NA 132* 135*  K 4.3 4.5  CL 97 101  CO2 26 26  GLUCOSE 146* 128*  BUN 17 17  CREATININE 0.83 0.87  CALCIUM 9.2 9.0  MG 2.1 2.2  PHOS 3.1 3.1  PROT  --  7.0  ALBUMIN  --  2.9*  AST  --  15  ALT  --  9  ALKPHOS  --  53  BILITOT  --  0.4  Corrected Ca = 9.9  Estimated Creatinine Clearance: 107 ml/min (by C-G formula based on Cr of 0.87).    Recent Labs  10/24/13 1804 10/24/13 2341 10/25/13 0609  GLUCAP 140* 152* 124*    Medications:  Scheduled:  . enoxaparin (LOVENOX) injection  40 mg Subcutaneous Q24H  . insulin aspart  0-9 Units Subcutaneous 4 times per day  . lip balm  1 application Topical BID  . pantoprazole (PROTONIX) IV  40 mg Intravenous QHS   Infusions:  . 0.9 % NaCl with KCl 20 mEq / L 30 mL/hr at 10/23/13 0600  . fat emulsion     PRN: diphenhydrAMINE, hydrALAZINE, HYDROmorphone (DILAUDID) injection, magic mouthwash, menthol-cetylpyridinium, metoprolol, ondansetron, oxyCODONE-acetaminophen, phenol, sodium chloride  Insulin Requirements in the past 24 hours:  5 units Novolog sliding scale (on sensitive-scale coverage q6h)  Nutritional Goals: Per updated RD assessment 7/17: 1950-2150 kCal/day 100-120 grams  protein/day Clinimix-E 5/20 at a goal rate of 83 mL/hr + Lipids 20% at 10 mL/hr will provide 100 grams protein/day,  2240 Kcal/day  Current Nutrition:  Clear liquid diet (started 7/22) Clinimix E 5/15 at 83 mL/hr and lipids 20% 7510ml/hr (goal rate achieved 7/20)  IVF: NS + KCl 20 mEq/L at 30 mL/hr  Assessment: [age] yoM with sigmoid diverticulitis and contained perforation was admitted 6/27 with initial plans for percutaneous drainage, IV antibiotics, and definitive surgery in a few weeks. Developed a new abscess despite non-surgical interventions, however, and ultimately required Hartmann's procedure on 7/16.  Had been either NPO or on liquid diet since admission.  Orders received 7/16 for PICC placement and begin TPN with pharmacy dosing assistance. PICC was placed 7/17 and TPN was started that evening.  TPN goal rate/formula were achieved on 7/20.   NG tube was removed 7/21. Clear liquids were started 7/22.  7/23: Beginning Day #7 TPN   Glucose - CBGs mostly < 150mg /dL on Z6Xq6h Novolog sensitive SSI  Renal - SCr and BUN WNL, stable; I/O appears incomplete.   Electrolytes - Na slightly low but improved; other lytes WNL  LFTs - all below ULN  TGs - WNL on 7/20  Prealbumin - 10.8 (7/17), 16.7 (7/20) - improving.  Overall demonstrating excellent tolerance of TPN on goal formula and rate. Patient states he is taking clear liquids without difficulty.  Plan:  1. Continue Clinimix-E 5/20 at goal rate of 83 mL/hr 2. Continue Fat Emulsion 20% at 10 mL/hr 3. Continue CBGs q6h with Novolog SSI (sensitive scale) coverage q6h. 4. TPN to include multivitamins and trace elements daily. 5. TPN labs every Monday and Thursday. 6.  Follow PO intake, diet advancement, and await word on when to start weaning TNA.  ADDENDUM:  Orders received from surgery to advance diet as tolerated and wean off TPN today. Will wean as follows: At 10 AM, reduce TPN to 60 mL/hr and Lipids to 8 mL/hr At 2 PM,  reduce TPN to 40 mL/hr and Lipids to 5 mL/hr At 6 PM, discontinue TPN and Lipids. DC CBGs, sliding scale insulin, TPN labs Convert multivitamins/minerals to PO route.  Elie Goody, PharmD, BCPS Pager: [phone] 10/25/2013  8:24 AM

## 2013-10-25 NOTE — Progress Notes (Signed)
Patient ID: Randy Park, male   DOB: 06-26-1960, 53 y.o.   MRN: [id number]  Subjective: Adequate ostomy output.  Hiccups resolved. No n/v.  Tolerated clears. Hungry.  No dysuria.    Objective:  Vital signs:  Filed Vitals:   10/24/13 0818 10/24/13 1351 10/24/13 2159 10/25/13 0612  BP:  114/74 126/80 106/75  Pulse:  97 88 96  Temp:  98.6 F (37 C) 98.3 F (36.8 C) 98.1 F (36.7 C)  TempSrc:  Oral Oral Oral  Resp: $Remo'15 16 16 14  'Ccjam$ Height:      Weight:      SpO2: 99% 100% 97% 99%    Last BM Date: 10/19/13  Intake/Output   Yesterday:  07/22 0701 - 07/23 0700 In: 480 [P.O.:480] Out: 2500 [Urine:2350; Stool:150] This shift:    I/O last 3 completed shifts: In: 480 [P.O.:480] Out: 3905 [Urine:3750; Drains:5; Stool:150]    Physical Exam:  General: Pt awake/alert/oriented x4 in no acute distress  Chest: CTA. No chest wall pain w good excursion  CV: Pulses intact. Regular rhythm  Abdomen: Soft. +BS. nondistended. Appropriately tender. Stoma is pink and viable, stool and air in ostomy bag. Midline wound is beefy red. No evidence of peritonitis. No incarcerated hernias.  Ext: SCDs BLE. No mjr edema. No cyanosis  Skin: No petechiae / purpura    Problem List:   Active Problems:   Diverticulitis of large intestine with perforation and abscess    Results:   Labs: Results for orders placed during the hospital encounter of 09/29/13 (from the past 48 hour(s))  GLUCOSE, CAPILLARY     Status: Abnormal   Collection Time    10/23/13 12:15 PM      Result Value Ref Range   Glucose-Capillary 124 (*) 70 - 99 mg/dL  GLUCOSE, CAPILLARY     Status: Abnormal   Collection Time    10/23/13  6:25 PM      Result Value Ref Range   Glucose-Capillary 123 (*) 70 - 99 mg/dL  GLUCOSE, CAPILLARY     Status: Abnormal   Collection Time    10/24/13 12:25 AM      Result Value Ref Range   Glucose-Capillary 142 (*) 70 - 99 mg/dL  GLUCOSE, CAPILLARY     Status: Abnormal   Collection Time     10/24/13  5:33 AM      Result Value Ref Range   Glucose-Capillary 124 (*) 70 - 99 mg/dL  GLUCOSE, CAPILLARY     Status: Abnormal   Collection Time    10/24/13 12:59 PM      Result Value Ref Range   Glucose-Capillary 134 (*) 70 - 99 mg/dL  GLUCOSE, CAPILLARY     Status: Abnormal   Collection Time    10/24/13  6:04 PM      Result Value Ref Range   Glucose-Capillary 140 (*) 70 - 99 mg/dL  GLUCOSE, CAPILLARY     Status: Abnormal   Collection Time    10/24/13 11:41 PM      Result Value Ref Range   Glucose-Capillary 152 (*) 70 - 99 mg/dL  COMPREHENSIVE METABOLIC PANEL     Status: Abnormal   Collection Time    10/25/13  4:55 AM      Result Value Ref Range   Sodium 135 (*) 137 - 147 mEq/L   Potassium 4.5  3.7 - 5.3 mEq/L   Chloride 101  96 - 112 mEq/L   CO2 26  19 - 32 mEq/L  Glucose, Bld 128 (*) 70 - 99 mg/dL   BUN 17  6 - 23 mg/dL   Creatinine, Ser 0.87  0.50 - 1.35 mg/dL   Calcium 9.0  8.4 - 10.5 mg/dL   Total Protein 7.0  6.0 - 8.3 g/dL   Albumin 2.9 (*) 3.5 - 5.2 g/dL   AST 15  0 - 37 U/L   ALT 9  0 - 53 U/L   Alkaline Phosphatase 53  39 - 117 U/L   Total Bilirubin 0.4  0.3 - 1.2 mg/dL   GFR calc non Af Amer >90  >90 mL/min   GFR calc Af Amer >90  >90 mL/min   Comment: (NOTE)     The eGFR has been calculated using the CKD EPI equation.     This calculation has not been validated in all clinical situations.     eGFR's persistently <90 mL/min signify possible Chronic Kidney     Disease.   Anion gap 8  5 - 15  MAGNESIUM     Status: None   Collection Time    10/25/13  4:55 AM      Result Value Ref Range   Magnesium 2.2  1.5 - 2.5 mg/dL  PHOSPHORUS     Status: None   Collection Time    10/25/13  4:55 AM      Result Value Ref Range   Phosphorus 3.1  2.3 - 4.6 mg/dL  CBC     Status: Abnormal   Collection Time    10/25/13  4:55 AM      Result Value Ref Range   WBC 6.5  4.0 - 10.5 K/uL   RBC 3.19 (*) 4.22 - 5.81 MIL/uL   Hemoglobin 9.6 (*) 13.0 - 17.0 g/dL   HCT  29.4 (*) 39.0 - 52.0 %   MCV 92.2  78.0 - 100.0 fL   MCH 30.1  26.0 - 34.0 pg   MCHC 32.7  30.0 - 36.0 g/dL   RDW 14.7  11.5 - 15.5 %   Platelets 645 (*) 150 - 400 K/uL  GLUCOSE, CAPILLARY     Status: Abnormal   Collection Time    10/25/13  6:09 AM      Result Value Ref Range   Glucose-Capillary 124 (*) 70 - 99 mg/dL    Imaging / Studies: No results found.  Scheduled Meds: . enoxaparin (LOVENOX) injection  40 mg Subcutaneous Q24H  . insulin aspart  0-9 Units Subcutaneous 4 times per day  . lip balm  1 application Topical BID  . pantoprazole (PROTONIX) IV  40 mg Intravenous QHS   Continuous Infusions: . Marland KitchenTPN (CLINIMIX-E) Adult    . 0.9 % NaCl with KCl 20 mEq / L 30 mL/hr at 10/23/13 0600  . fat emulsion     PRN Meds:.diphenhydrAMINE, hydrALAZINE, HYDROmorphone (DILAUDID) injection, magic mouthwash, menthol-cetylpyridinium, metoprolol, ondansetron, oxyCODONE-acetaminophen, phenol, sodium chloride   Antibiotics: Anti-infectives   Start     Dose/Rate Route Frequency Ordered Stop   10/15/13 1400  sulfamethoxazole-trimethoprim (BACTRIM DS) 800-160 MG per tablet 1 tablet  Status:  Discontinued     1 tablet Oral Every 12 hours 10/15/13 1223 10/17/13 0929   10/08/13 1830  fluconazole (DIFLUCAN) IVPB 200 mg     200 mg 100 mL/hr over 60 Minutes Intravenous Every 24 hours 10/08/13 1740 10/23/13 2029   10/08/13 1346  clindamycin (CLEOCIN) 900 mg, gentamicin (GARAMYCIN) 240 mg in sodium chloride 0.9 % 1,000 mL for intraperitoneal lavage  Status:  Discontinued  As needed 10/08/13 1347 10/08/13 1545   10/08/13 1230  gentamicin (GARAMYCIN) 720 mg, clindamycin (CLEOCIN) 2,700 mg in sodium chloride irrigation 0.9 % 3,000 mL irrigation  Status:  Discontinued      Irrigation  Once 10/08/13 1227 10/11/13 0804   10/08/13 1230  gentamicin (GARAMYCIN) 720 mg, clindamycin (CLEOCIN) 2,700 mg in sodium chloride irrigation 0.9 % 3,000 mL irrigation  Status:  Discontinued      Irrigation  Once  10/08/13 1228 10/11/13 0803   10/08/13 1030  metroNIDAZOLE (FLAGYL) IVPB 500 mg  Status:  Discontinued     500 mg 100 mL/hr over 60 Minutes Intravenous Every 6 hours 10/08/13 1025 10/25/13 0803   09/30/13 0600  metroNIDAZOLE (FLAGYL) IVPB 500 mg  Status:  Discontinued     500 mg 100 mL/hr over 60 Minutes Intravenous Every 8 hours 09/30/13 0026 09/30/13 1247   09/30/13 0600  imipenem-cilastatin (PRIMAXIN) 500 mg in sodium chloride 0.9 % 100 mL IVPB     500 mg 200 mL/hr over 30 Minutes Intravenous 4 times per day 09/30/13 0136 10/24/13 1201   09/29/13 2200  imipenem-cilastatin (PRIMAXIN) 500 mg in sodium chloride 0.9 % 100 mL IVPB     500 mg 200 mL/hr over 30 Minutes Intravenous  Once 09/29/13 2157 09/29/13 2353   09/29/13 2200  metroNIDAZOLE (FLAGYL) IVPB 500 mg     500 mg 100 mL/hr over 60 Minutes Intravenous  Once 09/29/13 2157 09/29/13 2320       Assessment/Plan  1. Recurrent Sigmoid diverticulitis with contained perforation admitted 09/29/13  2. New SB dilatation and possible pneumoperitoneum. CT scan show: rim enhancing 5.5 x 6.9 cm fluid collection in the pelvis,10/03/13; reflecting a developing abscess. IR drain placement; 10/05/13, Dr. Anselm Pancoast  3. Sigmoid diverticulitis with interloop & pelvic abscesses; SBO: LAPAROSCOPIC DRAINAGE OF ABDOMINAL ABSCESS X2 with drain placement, LAPAROSCOPIC LYSIS OF ADHESIONS x 40min. 10/08/2013, Adin Hector, MD.  4. Continued sigmoid perforation, pSBO - POD #6 s/p Ex Lap, Hartmans procedure, colon resection/colostomy, washout, alleviation of pSBO  5. Leukocytosis -resolved  6. Thrombocytosis - 684  7. PCM/TNA  -full liquid diet, advance as tolerated -wean off TPN -c/w current PO pain regimen, cannot take NSAIDs.  Reduce dilaudid  -Mobilize  -IS  -SCD/lovneox -consult to [facility] -teach dressing changes and consult for [facility] -anticipate discharge in AM  [facility], [facility] Pager [phone] Office  [phone]  10/25/2013 8:06 AM

## 2013-10-25 NOTE — Progress Notes (Signed)
[facility]  Patient Status: New  [facility] is providing the following services: RN.  I have met with Randy Park today to discuss [facility] RN visits for his ostomy and wound care teaching, he is in agreement to accepting the services at discharge.   If patient discharges after hours, please call [phone].   [name] 10/25/2013, 3:23 PM

## 2013-10-25 NOTE — Care Management Note (Signed)
    Page 1 of 1   10/25/2013     12:10:34 PM CARE MANAGEMENT NOTE 10/25/2013  Patient:  RUTGER, SALTON   Account Number:  [id number]  Date Initiated:  10/25/2013  Documentation initiated by:  [facility]  Subjective/Objective Assessment:   adm: Diverticulitis with perforation and abscess; s/p Hartmann's Procedure     Action/Plan:   discharge planning   Anticipated DC Date:  10/25/2013   Anticipated DC Plan:  [place]  CM consult      [facility] Choice  HOME HEALTH   Choice offered to / List presented to:  C-1 Patient        [place] arranged  HH-1 RN      [place].   Status of service:  Completed, signed off Medicare Important Message given?   (If response is "NO", the following Medicare IM given date fields will be blank) Date Medicare IM given:   Medicare IM given by:   Date Additional Medicare IM given:   Additional Medicare IM given by:    Discharge Disposition:  [place]  Per UR Regulation:    If discussed at Long Length of Stay Meetings, dates discussed:    Comments:  10/25/13 11:55 CM met with pt in room to offer choice for [facility] services.  Pt chooses [facility] to render [facility] for Ostomy Care/teaching and incision dressing change.  Address and contact information verified with pt.  Referral texted to [facility] for [facility].  No other CM needs were communicated.  Mariane Masters, BSN, CM [phone].

## 2013-10-26 LAB — CBC
HCT: 31 % — ABNORMAL LOW (ref 39.0–52.0)
Hemoglobin: 10.2 g/dL — ABNORMAL LOW (ref 13.0–17.0)
MCH: 29.8 pg (ref 26.0–34.0)
MCHC: 32.9 g/dL (ref 30.0–36.0)
MCV: 90.6 fL (ref 78.0–100.0)
PLATELETS: 692 10*3/uL — AB (ref 150–400)
RBC: 3.42 MIL/uL — AB (ref 4.22–5.81)
RDW: 14.8 % (ref 11.5–15.5)
WBC: 7.3 10*3/uL (ref 4.0–10.5)

## 2013-10-26 MED ORDER — OXYCODONE-ACETAMINOPHEN 5-325 MG PO TABS: 1.0000 | ORAL_TABLET | Freq: Four times a day (QID) | ORAL | Status: DC | PRN

## 2013-10-26 MED ORDER — PANTOPRAZOLE SODIUM 40 MG PO TBEC
40.0000 mg | DELAYED_RELEASE_TABLET | Freq: Every day | ORAL | Status: DC
  Administered 2013-10-26 – 2013-10-27 (×2): 40 mg via ORAL
  Filled 2013-10-26 (×2): qty 1

## 2013-10-26 MED ORDER — ADULT MULTIVITAMIN W/MINERALS CH: 1.0000 | ORAL_TABLET | Freq: Every day | ORAL | Status: DC

## 2013-10-26 NOTE — Progress Notes (Signed)
PHARMACY BRIEF NOTE:  PROTONIX IV TO PO CONVERSION  Patient is receiving Protonix by the intravenous route.  Based on criteria approved by the Pharmacy and Therapeutics Committee and the Medical Executive Committee, this medication is being converted to the equivalent oral dose form.  These criteria include: -No active GI bleeding -Able to tolerate diet of full liquids (or better) or tube feeding -Able to tolerate other medications by the oral or enteral route  If there are questions about this conversion, please contact the Pharmacy Department (phone [phone]).    Thank you, [name] Tarnisha Kachmar, PharmD, BCPS 10/26/2013  12:42 PM

## 2013-10-26 NOTE — Progress Notes (Signed)
Seen and agree  

## 2013-10-26 NOTE — Progress Notes (Signed)
CARE MANAGEMENT NOTE 10/26/2013  Patient:  KOLETON, DUCHEMIN   Account Number:  [id number]  Date Initiated:  10/25/2013  Documentation initiated by:  [facility]  Subjective/Objective Assessment:   adm: Diverticulitis with perforation and abscess; s/p Hartmann's Procedure     Action/Plan:   discharge planning   Anticipated DC Date:  10/27/2013   Anticipated DC Plan:  [place]  CM consult      [facility] Choice  HOME HEALTH   Choice offered to / List presented to:  C-1 Patient        [place] arranged  HH-1 RN      [place].   Status of service:  Completed, signed off Medicare Important Message given?   (If response is "NO", the following Medicare IM given date fields will be blank) Date Medicare IM given:   Medicare IM given by:   Date Additional Medicare IM given:   Additional Medicare IM given by:    Discharge Disposition:  [place]  Per UR Regulation:  Reviewed for med. necessity/level of care/duration of stay  If discussed at [place] of Stay Meetings, dates discussed:   10/25/2013    Comments:  10/26/13 MMcGibboney, RN, BSN Plan to D/C late today or in AM. Pt has [place] for [facility].  10/25/13 11:55 CM met with pt in room to offer choice for [facility] services.  Pt chooses [facility] to render [facility] for Ostomy Care/teaching and incision dressing change.  Address and contact information verified with pt.  Referral texted to [facility] for [facility].  No other CM needs were communicated.  Mariane Masters, BSN, CM [phone].

## 2013-10-26 NOTE — Discharge Instructions (Signed)
[facility]      [facility] °[phone] ° °OPEN ABDOMINAL SURGERY: POST OP INSTRUCTIONS ° °Always review your discharge instruction sheet given to you by the facility where your surgery was performed. ° °IF YOU HAVE DISABILITY OR FAMILY LEAVE FORMS, YOU MUST BRING THEM TO THE OFFICE FOR PROCESSING.  PLEASE DO NOT GIVE THEM TO YOUR DOCTOR. ° °1. A prescription for pain medication may be given to you upon discharge.  Take your pain medication as prescribed, if needed.  If narcotic pain medicine is not needed, then you may take acetaminophen (Tylenol) or ibuprofen (Advil) as needed. °2. Take your usually prescribed medications unless otherwise directed. °3. If you need a refill on your pain medication, please contact your pharmacy. They will contact our office to request authorization.  Prescriptions will not be filled after 5pm or on week-ends. °4. You should follow a light diet the first few days after arrival home, such as soup and crackers, pudding, etc.unless your doctor has advised otherwise. A high-fiber, low fat diet can be resumed as tolerated.   Be sure to include lots of fluids daily. Most patients will experience some swelling and bruising on the chest and neck area.  Ice packs will help.  Swelling and bruising can take several days to resolve °5. Most patients will experience some swelling and bruising in the area of the incision. Ice pack will help. Swelling and bruising can take several days to resolve..  °6. It is common to experience some constipation if taking pain medication after surgery.  Increasing fluid intake and taking a stool softener will usually help or prevent this problem from occurring.  A mild laxative (Milk of Magnesia or Miralax) should be taken according to package directions if there are no bowel movements after 48 hours. °7.  You may have steri-strips (small skin tapes) in place directly over the incision.  These strips should be left on the skin for 7-10 days.  If your  surgeon used skin glue on the incision, you may shower in 24 hours.  The glue will flake off over the next 2-3 weeks.  Any sutures or staples will be removed at the office during your follow-up visit. You may find that a light gauze bandage over your incision may keep your staples from being rubbed or pulled. You may shower and replace the bandage daily. °8. ACTIVITIES:  You may resume regular (light) daily activities beginning the next day--such as daily self-care, walking, climbing stairs--gradually increasing activities as tolerated.  You may have sexual intercourse when it is comfortable.  Refrain from any heavy lifting or straining until approved by your doctor. °a. You may drive when you no longer are taking prescription pain medication, you can comfortably wear a seatbelt, and you can safely maneuver your car and apply brakes °b. Return to Work: ___________________________________ °9. You should see your doctor in the office for a follow-up appointment approximately two weeks after your surgery.  Make sure that you call for this appointment within a day or two after you arrive home to insure a convenient appointment time. °OTHER INSTRUCTIONS:  °_____________________________________________________________ °_____________________________________________________________ ° °WHEN TO CALL YOUR DOCTOR: °1. Fever over 101.0 °2. Inability to urinate °3. Nausea and/or vomiting °4. Extreme swelling or bruising °5. Continued bleeding from incision. °6. Increased pain, redness, or drainage from the incision. °7. Difficulty swallowing or breathing °8. Muscle cramping or spasms. °9. Numbness or tingling in hands or feet or around lips. ° °The clinic staff is available to   answer your questions during regular business hours.  Please dont hesitate to call and ask to speak to one of the nurses if you have concerns.  For further questions, please visit [email or web address]   Low-Fiber Diet Fiber is found in  fruits, vegetables, and whole grains. A low-fiber diet restricts fibrous foods that are not digested in the small intestine. A diet containing about 10-15 grams of fiber per day is considered low fiber. Low-fiber diets may be used to:  Promote healing and rest the bowel during intestinal flare-ups.  Prevent blockage of a partially obstructed or narrowed gastrointestinal tract.  Reduce fecal weight and volume.  Slow the movement of feces. You may be on a low-fiber diet as a transitional diet following surgery, after an injury (trauma), or because of a short (acute) or lifelong (chronic) illness. Your health care provider will determine the length of time you need to stay on this diet.  WHAT DO I NEED TO KNOW ABOUT A LOW-FIBER DIET? Always check the fiber content on the packaging's Nutrition Facts label, especially on foods from the grains list. Ask your dietitian if you have questions about specific foods that are related to your condition, especially if the food is not listed below. In general, a low-fiber food will have less than 2 g of fiber. WHAT FOODS CAN I EAT? Grains All breads and crackers made with white flour. Sweet rolls, doughnuts, waffles, pancakes, [place] toast, bagels. Pretzels, Melba toast, zwieback. Well-cooked cereals, such as cornmeal, farina, or cream cereals. Dry cereals that do not contain whole grains, fruit, or nuts, such as refined corn, wheat, rice, and oat cereals. Potatoes prepared any way without skins, plain pastas and noodles, refined white rice. Use white flour for baking and making sauces. Use allowed list of grains for casseroles, dumplings, and puddings.  Vegetables Strained tomato and vegetable juices. Fresh lettuce, cucumber, spinach. Well-cooked (no skin or pulp) or canned vegetables, such as asparagus, bean sprouts, beets, carrots, green beans, mushrooms, potatoes, pumpkin, spinach, yellow squash, tomato sauce/puree, turnips, yams, and zucchini. Keep servings  limited to  cup.  Fruits All fruit juices except prune juice. Cooked or canned fruits without skin and seeds, such as applesauce, apricots, cherries, fruit cocktail, grapefruit, grapes, mandarin oranges, melons, peaches, pears, pineapple, and plums. Fresh fruits without skin, such as apricots, avocados, bananas, melons, pineapple, nectarines, and peaches. Keep servings limited to  cup or 1 piece.  Meat and Other Protein Sources Ground or well-cooked tender beef, ham, veal, lamb, pork, or poultry. Eggs, plain cheese. Fish, oysters, shrimp, lobster, and other seafood. Liver, organ meats. Smooth nut butters. Dairy All milk products and alternative dairy substitutes, such as soy, rice, almond, and coconut, not containing added whole nuts, seeds, or added fruit. Beverages Decaf coffee, fruit, and vegetable juices or smoothies (small amounts, with no pulp or skins, and with fruits from allowed list), sports drinks, herbal tea. Condiments Ketchup, mustard, vinegar, cream sauce, cheese sauce, cocoa powder. Spices in moderation, such as allspice, basil, bay leaves, celery powder or leaves, cinnamon, cumin powder, curry powder, ginger, mace, marjoram, onion or garlic powder, oregano, paprika, parsley flakes, ground pepper, rosemary, sage, savory, tarragon, thyme, and turmeric. Sweets and Desserts Plain cakes and cookies, pie made with allowed fruit, pudding, custard, cream pie. Gelatin, fruit, ice, sherbet, frozen ice pops. Ice cream, ice milk without nuts. Plain hard candy, honey, jelly, molasses, syrup, sugar, chocolate syrup, gumdrops, marshmallows. Limit overall sugar intake.  Fats and Oil Margarine, butter, cream, mayonnaise, salad oils, plain  salad dressings made from allowed foods. Choose healthy fats such as olive oil, canola oil, and omega-3 fatty acids (such as found in salmon or tuna) when possible.  Other Bouillon, broth, or cream soups made from allowed foods. Any strained soup. Casseroles or  mixed dishes made with allowed foods. The items listed above may not be a complete list of recommended foods or beverages. Contact your dietitian for more options.  WHAT FOODS ARE NOT RECOMMENDED? Grains All whole wheat and whole grain breads and crackers. Multigrains, rye, bran seeds, nuts, or coconut. Cereals containing whole grains, multigrains, bran, coconut, nuts, raisins. Cooked or dry oatmeal, steel-cut oats. Coarse wheat cereals, granola. Cereals advertised as high fiber. Potato skins. Whole grain pasta, wild or brown rice. Popcorn. Coconut flour. Bran, buckwheat, corn bread, multigrains, rye, wheat germ.  Vegetables Fresh, cooked or canned vegetables, such as artichokes, asparagus, beet greens, broccoli, Brussels sprouts, cabbage, celery, cauliflower, corn, eggplant, kale, legumes or beans, okra, peas, and tomatoes. Avoid large servings of any vegetables, especially raw vegetables.  Fruits Fresh fruits, such as apples with or without skin, berries, cherries, figs, grapes, grapefruit, guavas, kiwis, mangoes, oranges, papayas, pears, persimmons, pineapple, and pomegranate. Prune juice and juices with pulp, stewed or dried prunes. Dried fruits, dates, raisins. Fruit seeds or skins. Avoid large servings of all fresh fruits. Meats and Other Protein Sources Tough, fibrous meats with gristle. Chunky nut butter. Cheese made with seeds, nuts, or other foods not recommended. Nuts, seeds, legumes (beans, including baked beans), dried peas, beans, lentils.  Dairy Yogurt or cheese that contains nuts, seeds, or added fruit.  Beverages Fruit juices with high pulp, prune juice. Caffeinated coffee and teas.  Condiments Coconut, maple syrup, pickles, olives. Sweets and Desserts Desserts, cookies, or candies that contain nuts or coconut, chunky peanut butter, dried fruits. Jams, preserves with seeds, marmalade. Large amounts of sugar and sweets. Any other dessert made with fruits from the not recommended  list.  Other Soups made from vegetables that are not recommended or that contain other foods not recommended.  The items listed above may not be a complete list of foods and beverages to avoid. Contact your dietitian for more information. Document Released: 09/11/2001 Document Revised: 03/27/2013 Document Reviewed: 02/12/2013 [facility] Patient Information 2015 [place], [place]. This information is not intended to replace advice given to you by your health care provider. Make sure you discuss any questions you have with your health care provider.

## 2013-10-26 NOTE — Progress Notes (Signed)
[facility] Progress Note  8 Days Post-Op  Subjective: Pt doing much better.  Tolerating some solid food.  Appetite low.  Getting ostomy teaching.  Plan for discharge tomorrow after ostomy teaching with wife and niece (home nurse).  Ambulating well.  Having good ostomy output.   Objective: Vital signs in last 24 hours: Temp:  [98.3 F (36.8 C)-98.7 F (37.1 C)] 98.5 F (36.9 C) (07/24 0604) Pulse Rate:  [94-102] 97 (07/24 0604) Resp:  [15-20] 20 (07/24 0604) BP: (100-120)/(68-74) 115/74 mmHg (07/24 0604) SpO2:  [97 %-100 %] 99 % (07/24 0604) Last BM Date: 10/19/13  Intake/Output from previous day: 07/23 0701 - 07/24 0700 In: 1824 [P.O.:600; I.V.:480; TPN:744] Out: 1275 [Urine:1075; Stool:200] Intake/Output this shift:    PE: Gen:  Alert, NAD, pleasant Abd: Soft, ND, mild tenderness, +BS, no HSM, midline wound clean, pink granulation tissue present, >1cm wide.  JP drain out and site healing well.     Lab Results:   Recent Labs  10/25/13 0455  WBC 6.5  HGB 9.6*  HCT 29.4*  PLT 645*   BMET  Recent Labs  10/25/13 0455  NA 135*  K 4.5  CL 101  CO2 26  GLUCOSE 128*  BUN 17  CREATININE 0.87  CALCIUM 9.0   PT/INR No results found for this basename: LABPROT, INR,  in the last 72 hours CMP     Component Value Date/Time   NA 135* 10/25/2013 0455   K 4.5 10/25/2013 0455   CL 101 10/25/2013 0455   CO2 26 10/25/2013 0455   GLUCOSE 128* 10/25/2013 0455   BUN 17 10/25/2013 0455   CREATININE 0.87 10/25/2013 0455   CALCIUM 9.0 10/25/2013 0455   PROT 7.0 10/25/2013 0455   ALBUMIN 2.9* 10/25/2013 0455   AST 15 10/25/2013 0455   ALT 9 10/25/2013 0455   ALKPHOS 53 10/25/2013 0455   BILITOT 0.4 10/25/2013 0455   GFRNONAA >90 10/25/2013 0455   GFRAA >90 10/25/2013 0455   Lipase     Component Value Date/Time   LIPASE 12 09/29/2013 1848       Studies/Results: No results found.  Anti-infectives: Anti-infectives   Start     Dose/Rate Route Frequency Ordered  Stop   10/15/13 1400  sulfamethoxazole-trimethoprim (BACTRIM DS) 800-160 MG per tablet 1 tablet  Status:  Discontinued     1 tablet Oral Every 12 hours 10/15/13 1223 10/17/13 0929   10/08/13 1830  fluconazole (DIFLUCAN) IVPB 200 mg     200 mg 100 mL/hr over 60 Minutes Intravenous Every 24 hours 10/08/13 1740 10/23/13 2029   10/08/13 1346  clindamycin (CLEOCIN) 900 mg, gentamicin (GARAMYCIN) 240 mg in sodium chloride 0.9 % 1,000 mL for intraperitoneal lavage  Status:  Discontinued       As needed 10/08/13 1347 10/08/13 1545   10/08/13 1230  gentamicin (GARAMYCIN) 720 mg, clindamycin (CLEOCIN) 2,700 mg in sodium chloride irrigation 0.9 % 3,000 mL irrigation  Status:  Discontinued      Irrigation  Once 10/08/13 1227 10/11/13 0804   10/08/13 1230  gentamicin (GARAMYCIN) 720 mg, clindamycin (CLEOCIN) 2,700 mg in sodium chloride irrigation 0.9 % 3,000 mL irrigation  Status:  Discontinued      Irrigation  Once 10/08/13 1228 10/11/13 0803   10/08/13 1030  metroNIDAZOLE (FLAGYL) IVPB 500 mg  Status:  Discontinued     500 mg 100 mL/hr over 60 Minutes Intravenous Every 6 hours 10/08/13 1025 10/25/13 0803   09/30/13 0600  metroNIDAZOLE (FLAGYL) IVPB 500  mg  Status:  Discontinued     500 mg 100 mL/hr over 60 Minutes Intravenous Every 8 hours 09/30/13 0026 09/30/13 1247   09/30/13 0600  imipenem-cilastatin (PRIMAXIN) 500 mg in sodium chloride 0.9 % 100 mL IVPB     500 mg 200 mL/hr over 30 Minutes Intravenous 4 times per day 09/30/13 0136 10/24/13 1201   09/29/13 2200  imipenem-cilastatin (PRIMAXIN) 500 mg in sodium chloride 0.9 % 100 mL IVPB     500 mg 200 mL/hr over 30 Minutes Intravenous  Once 09/29/13 2157 09/29/13 2353   09/29/13 2200  metroNIDAZOLE (FLAGYL) IVPB 500 mg     500 mg 100 mL/hr over 60 Minutes Intravenous  Once 09/29/13 2157 09/29/13 2320       Assessment/Plan 1. Recurrent Sigmoid diverticulitis with contained perforation admitted 09/29/13  2. New SB dilatation and possible  pneumoperitoneum. CT scan show: rim enhancing 5.5 x 6.9 cm fluid collection in the pelvis,10/03/13; reflecting a developing abscess. IR drain placement; 10/05/13, Dr. Lowella DandyHenn  3. Sigmoid diverticulitis with interloop & pelvic abscesses; SBO: LAPAROSCOPIC DRAINAGE OF ABDOMINAL ABSCESS X2 with drain placement, LAPAROSCOPIC LYSIS OF ADHESIONS x 90min. 10/08/2013, Ardeth SportsmanSteven C. Gross, MD.  4. Continued sigmoid perforation, pSBO - POD #8 s/p Ex Lap, Hartmans procedure, colon resection/colostomy, washout, alleviation of pSBO  5. Leukocytosis -resolved  6. Thrombocytosis - 645 improving 7. PCM/TNA  -Tolerating soft diet, TNA discontinued -Mobilize & IS  -SCD/lovneox  -JP drain discontinued -Discontinued antibiotics, recheck CBC good at 7.3 -Plan for d/c tomorrow after ostomy teaching with wife and niece (home health nurse) -Orals only for pain -F/u with Dr. Johna SheriffHoxworth in 2-3 weeks for post-op check     LOS: 27 days    DORT, Cassey Hurrell 10/26/2013, 9:10 AM Pager: [phone]

## 2013-10-27 MED ORDER — OXYCODONE-ACETAMINOPHEN 5-325 MG PO TABS: 1.0000 | ORAL_TABLET | ORAL | Status: DC | PRN

## 2013-10-27 NOTE — Progress Notes (Signed)
Patient ID: Randy Park, male   DOB: [birth year], 53 y.o.   MRN: [id number]  [facility]  POD# 9  Subjective: Patient pleasant, excited about going home, family at bedside.  No complaints.  Comfortable with ostomy and wound care.  HHN arranged.  Objective: Vital signs in last 24 hours: Temp:  [97.9 F (36.6 C)-98.8 F (37.1 C)] 98.1 F (36.7 C) (07/25 0552) Pulse Rate:  [91-99] 99 (07/25 0552) Resp:  [18-20] 18 (07/25 0552) BP: (95-111)/(62-74) 109/74 mmHg (07/25 0552) SpO2:  [100 %] 100 % (07/25 0552) Last BM Date: 10/19/13  Intake/Output from previous day: 07/24 0701 - 07/25 0700 In: 1424 [P.O.:837; I.V.:587] Out: 1750 [Urine:1150; Stool:600]  Exam: HEENT - clear, not icteric Neck - soft Chest - clear bilaterally Cor - RRR, no murmur Abd - soft without distension; BS present; stoma viable; midline dressing intact Ext - no significant edema Neuro - grossly intact, no focal deficits  Lab Results:   Recent Labs  10/25/13 0455 10/26/13 1000  WBC 6.5 7.3  HGB 9.6* 10.2*  HCT 29.4* 31.0*  PLT 645* 692*     Recent Labs  10/25/13 0455  NA 135*  K 4.5  CL 101  CO2 26  GLUCOSE 128*  BUN 17  CREATININE 0.87  CALCIUM 9.0    Studies/Results: No results found.  Assessment / Plan: 1.  Status post Hartmann's resection for perforated diverticular disease  Discharge home today  HHN arranged for wound and stoma care  Rx given for pain med  Velora Hecklerodd M. Kingstyn Deruiter, MD, [facility] Office: [phone]  10/27/2013

## 2013-10-27 NOTE — Consult Note (Signed)
WOC ostomy follow up Stoma type/location: LMQ Colostomy Stomal assessment/size: 1 and 1/2 inch stoma that is now slightly oval.  Skin barrier ring used to provide skin protection. Peristomal assessment:  Treatment options for stomal/peristomal skin: skin barrier ring Output brown soft stool Ostomy pouching: 2pc. 2 and 1/4 inch pouching system with skin barrier ring Education provided: Patient and wife and cousin present for teaching session.  Stoma sizing, pouch removal, pouch application demonstrated and all indicate understanding. Instructed to work with [employer] personnel regarding the obtaining of ostomy supplies until reanastomosis surgery in approximately 6 months. Enrolled [place] in [employer] Discharge program: Yes It has been a pleasure taking care of this nice gentleman.  Thank you for involving me in his care. Ladona MowLaurie Sehar Sedano, MSN, RN, GNP, [place], CWON-AP [phone])

## 2013-10-27 NOTE — Progress Notes (Signed)
Assessment unchanged. Pt and wife verbalized understanding of dc instructions through teach back. Plans to sign up with My Chart soon. Wife observed dressing change earlier and instructed with verbalized understanding. [facility] to see pt at home this evening for second dressing change. Script x 1 given as provided by MD. Discharged via wc to front entrance to meet awaiting vehicle to carry home. Accompanied by NT and wife.

## 2013-10-31 ENCOUNTER — Telehealth (INDEPENDENT_AMBULATORY_CARE_PROVIDER_SITE_OTHER): Payer: Self-pay | Admitting: *Deleted

## 2013-10-31 NOTE — Telephone Encounter (Signed)
I called pt back and advised him that Dr. Johna SheriffHoxworth said a stool softener would be ok.  I advised pt that any type of OTC stool softener would be fine.  Pt verbalized understanding and appreciation.  Victorino DikeJennifer

## 2013-10-31 NOTE — Telephone Encounter (Signed)
Stool softener fine.

## 2013-10-31 NOTE — Telephone Encounter (Signed)
This msg needs to be forwarded to Dr Johna SheriffHoxworth. He did pts surgery.

## 2013-10-31 NOTE — Telephone Encounter (Deleted)
Caitlin from [facility] called regarding pt.  She is out at pt's house this morning for a nurse visit.  She advised that his incision looks great.  The only concern she has is that the pt's stool looks thick and is pulling his colostomy bag.  She states that the pt is not on a stool softener and was wanting to know if one can be ordered for the pt.  Please advise!  Thanks!  Alyan Hartline 

## 2013-10-31 NOTE — Telephone Encounter (Signed)
Caitlin from [facility] called regarding pt.  She is out at pt's house this morning for a nurse visit.  She advised that his incision looks great.  The only concern she has is that the pt's stool looks thick and is pulling his colostomy bag.  She states that the pt is not on a stool softener and was wanting to know if one can be ordered for the pt.  Please advise!  Thanks!  Victorino DikeJennifer

## 2013-11-05 ENCOUNTER — Telehealth (INDEPENDENT_AMBULATORY_CARE_PROVIDER_SITE_OTHER): Payer: Self-pay

## 2013-11-05 ENCOUNTER — Ambulatory Visit: Payer: BC Managed Care – HMO | Admitting: Gastroenterology

## 2013-11-05 ENCOUNTER — Other Ambulatory Visit (INDEPENDENT_AMBULATORY_CARE_PROVIDER_SITE_OTHER): Payer: Self-pay | Admitting: General Surgery

## 2013-11-05 MED ORDER — OXYCODONE-ACETAMINOPHEN 5-325 MG PO TABS: 1.0000 | ORAL_TABLET | ORAL | Status: DC | PRN

## 2013-11-05 MED ORDER — OXYCODONE-ACETAMINOPHEN 5-325 MG PO TABS: 1.0000 | ORAL_TABLET | Freq: Four times a day (QID) | ORAL | Status: DC | PRN

## 2013-11-05 NOTE — Telephone Encounter (Signed)
Called pt to let him know Rx for Percocet 5/325 #40 will be ready for him to pick up at the front desk. Pt verbalized understanding

## 2013-11-05 NOTE — Telephone Encounter (Signed)
Patient states he is having skin irritation around colostomy area . He is also in need of pain medication refill . Rates pain 8. Denies constipation, temp . Advised patient to contact home care which is scheduled to do colostomy care today let them know about skin break down. We will call patient this pm concerning refill of oxycodone 5/325mg . Patient verbalized understanding

## 2013-11-07 ENCOUNTER — Telehealth (INDEPENDENT_AMBULATORY_CARE_PROVIDER_SITE_OTHER): Payer: Self-pay

## 2013-11-07 NOTE — Telephone Encounter (Signed)
Randy Park form [facility] called stating pt having some swelling and pain since Monday on right side abdomen. Pt denies n/v, fever, chills and is having regular BM's. Pt is increasing stool softener since BM's are still a little hard. Pt also having burning sensation when urinating. Spoke to Dr Johna SheriffHoxworth and he is ok with waiting to see pt on Friday at scheduled appt. Does not sound like abscess since pt has no other symptoms. He did give verbal order for UA to check for UTI. Advised for her or pt to call back if any of above symptoms arise and we would be glad to see pt sooner.

## 2013-11-07 NOTE — Discharge Summary (Signed)
Physician Discharge Summary  Leslye PeerGirardeau K Armijo WUJ:[id number]:[id number] DOB: [birth year] DOA: 09/29/2013  PCP: Lanell PersonsKINGSLEY,THOMAS, MD  Consultation:  Admit date: 09/29/2013 Discharge date: 10/27/2013  Recommendations for Outpatient Follow-up:   Follow-up Information   Follow up with [facility]. (home health nurse for ostomy care and teaching and incisional wound dressing change)    Contact information:   [address] [place] [address] [phone]       Follow up with Mariella SaaHOXWORTH,BENJAMIN T, MD. Schedule an appointment as soon as possible for a visit in 2 weeks. (For post-operation check)    Specialty:  General Surgery   Contact information:   [address] [place] [address] [phone]      Discharge Diagnoses:  1. Recurrent sigmoid diverticulitis with contained perforation 2. PSBO 3. Intra-abdominal fluid collections 4. Leukocytosis 5. Thrombocytosis 6. [facility]   Surgical Procedure: Laparoscopic lysis of adhesions x1990mins---Dr. Michaell CowingGross 10/08/13    Exploratory laparotomy, hartman's procedure---Dr. Johna SheriffHoxworth 10/18/13   Discharge Condition: stable Disposition: home  Diet recommendation: soft  Filed Weights   09/29/13 1838 10/18/13 1355  Weight: 190 lb (86.183 kg) 190 lb 0.6 oz (86.2 kg)     Filed Vitals:   10/27/13 0552  BP: 109/74  Pulse: 99  Temp: 98.1 F (36.7 C)  Resp: 18     Hospital Course:  Debroah BallerGirard Gaydos is a 53 year [facility].  [facility].  His work up showed sigmoid diverticulitis with contained perforation and a PSBO.  He was admitted for bowel rest and IV antibiotics.  He had a percutaneous drain placed.  Follow up CT showed persistent and worsening inflammation. He therefore underwent laparoscopic drainage of abdominal abscesses and drain placement along with LOA x90 mins.  Water soluble enema showed extravasation and therefore  the patient had a Hartman's procedure.  He was started on TPN.  Post operatively, he was was once again mobilized and progressed.  JP drain were subsequently removed.  He completed his course of antibiotics. [facility].   On HD#8 and prolonged hospitalization, he was tolerating a diet, ambulating, pain well controlled, afebrile and therefore felt stable for discharge home with home health per Dr. Gerrit FriendsGerkin.  He is to follow up with Dr. Johna SheriffHoxworth.     Discharge Instructions  Discharge Instructions   Change dressing (specify)    Complete by:  As directed   Dressing change: twice daily as instructed with assistance of HHN.     Diet - low sodium heart healthy    Complete by:  As directed      Discharge instructions    Complete by:  As directed   [facility]  OPEN ABDOMINAL SURGERY: POST OP INSTRUCTIONS  Always review your discharge instruction sheet given to you by the facility where your surgery was performed.  A prescription for pain medication may be given to you upon discharge.  Take your pain medication as prescribed.  If narcotic pain medicine is not needed, then you may take acetaminophen (Tylenol) or ibuprofen (Advil) as needed. Take your usually prescribed medications unless otherwise directed. If you need a refill on your pain medication, please contact your pharmacy. They will contact our office to request authorization.  Prescriptions will not be filled after 5 pm or on weekends. You should follow a light diet the first few days after arrival home, such as soup  and crackers, unless your doctor has advised otherwise. A high-fiber, low fat diet can be resumed as tolerated.  Be sure to include plenty of fluids daily.  Most patients will experience some swelling and bruising in the area of the incision. Ice packs will help. Swelling and bruising can take several days to resolve. It is common to experience some constipation if taking pain medication  after surgery.  Increasing fluid intake and taking a stool softener will usually help or prevent this problem from occurring.  A mild laxative (Milk of Magnesia or Miralax) should be taken according to package directions if there are no bowel movements after 48 hours.  You may have steri-strips (small skin tapes) in place directly over the incision.  These strips should be left on the skin for 7-10 days.  If your surgeon used skin glue on the incision, you may shower in 24 hours.  The glue will flake off over the next 2-3 weeks.  Any sutures or staples will be removed at the office during your follow-up visit. You may find that a light gauze bandage over your incision may keep your staples from being rubbed or pulled. You may shower and replace the bandage daily. ACTIVITIES:  You may resume regular (light) daily activities beginning the next day-such as daily self-care, walking, climbing stairs-gradually increasing activities as tolerated.  You may have sexual intercourse when it is comfortable.  Refrain from any heavy lifting or straining until approved by your doctor.  You may drive when you no longer are taking prescription pain medication, you can comfortably wear a seatbelt, and you can safely maneuver your car and apply brakes. You should see your doctor in the office for a follow-up appointment approximately two weeks after your surgery.  Make sure that you call for this appointment within a day or two after you arrive home to insure a convenient appointment time.  WHEN TO CALL YOUR DOCTOR: Fever greater than 101.0 Inability to urinate Persistent nausea and/or vomiting Extreme swelling or bruising Continued bleeding from incision Increased pain, redness, or drainage from the incision Difficulty swallowing or breathing Muscle cramping or spasms Numbness or tingling in hands or around lips  IF YOU HAVE DISABILITY OR FAMILY LEAVE FORMS, YOU MUST BRING THEM TO THE OFFICE FOR PROCESSING.  PLEASE DO  NOT GIVE THEM TO YOUR DOCTOR.  The clinic staff is available to answer your questions during regular business hours.  Please don't hesitate to call and ask to speak to one of the nurses if you have concerns.  [facility] [place] Office: [phone]  For further questions, please visit [email or web address]     Increase activity slowly    Complete by:  As directed             Medication List    STOP taking these medications       ondansetron 4 MG tablet  Commonly known as:  ZOFRAN      TAKE these medications       multivitamin with minerals Tabs tablet  Take 1 tablet by mouth daily.           Follow-up Information   Follow up with [facility]. (home health nurse for ostomy care and teaching and incisional wound dressing change)    Contact information:   [address] [place] [address] [phone]       Follow up with Mariella Saa, MD. Schedule an appointment as soon as possible for a visit in 2  weeks. (For post-operation check)    Specialty:  General Surgery   Contact information:   [address] [place] [address] [phone]        The results of significant diagnostics from this hospitalization (including imaging, microbiology, ancillary and laboratory) are listed below for reference.    Significant Diagnostic Studies: Dg Abd 1 View  10/10/2013   CLINICAL DATA:  Ruptured diverticulitis and status post percutaneous and laparoscopic drainage of abscesses.  EXAM: ABDOMEN - 1 VIEW  COMPARISON:  CT on 10/08/2013.  FINDINGS: Nasogastric tube extends into the stomach with the tip located in the proximal body. Gas-filled and mildly dilated loops of small bowel present. Findings most likely relate to postoperative ileus. No gross free air identified.  IMPRESSION: Nasogastric tube extends into stomach.  Small bowel ileus.   Electronically Signed   By: Irish Lack M.D.   On: 10/10/2013 11:50    Ct Abdomen Pelvis W Contrast  10/16/2013   CLINICAL DATA:  Diverticular abscess.  EXAM: CT ABDOMEN AND PELVIS WITH CONTRAST  TECHNIQUE: Multidetector CT imaging of the abdomen and pelvis was performed using the standard protocol following bolus administration of intravenous contrast.  CONTRAST:  25mL OMNIPAQUE IOHEXOL 300 MG/ML SOLN, OMNIPAQUE IOHEXOL 300 MG/ML SOLN  COMPARISON:  CT scan of October 08, 2013.  FINDINGS: Bilateral basilar opacities are noted concerning for pneumonia or subsegmental atelectasis. No significant osseous abnormality is noted.  No gallstones are noted. The liver, spleen and pancreas appear normal. Adrenal glands and kidneys appear normal. There has been interval placement of 2 surgical drains, with 1 entering the right upper quadrant of the abdomen and its tip in the left lower quadrant. The other enters the left upper quadrant with its tip in the left lower quadrant is well. There is noted predominantly air-filled abscess posteriorly within the pelvis that measures 10.3 x 6.3 x 5.4 cm with small amount of fluid present with then. Urinary bladder appears normal. Percutaneous drainage catheter noted in posterior portion of pelvis on prior exam has been removed. Residual fluid collection remains in this area measuring 34 x 16 mm. No definite evidence of bowel obstruction is noted. Stool is noted in the rectum. Sigmoid diverticulosis is noted with mild wall thickening which may represent some degree of acute inflammation.  IMPRESSION: Mild bilateral posterior basilar opacities are noted concerning for subsegmental atelectasis or possibly pneumonia.  Interval placement of 2 surgical drains are noted as described above. There is continued presence of large predominantly air-filled abscess extending from the posterior and central portion of the abdomen into the pelvis, which contains a small amount of fluid. This abnormality does appear to be slightly enlarged compared to prior exam.   Percutaneous drainage catheter noted on prior exam has been removed, with small residual fluid collection remaining in the posterior portion of the pelvis.   Electronically Signed   By: Roque Lias M.D.   On: 10/16/2013 10:08   Dg Colon W/water Sol Cm  10/17/2013   CLINICAL DATA:  Perforated diverticulitis.  Evaluate for leak.  EXAM: WATER SOLUBLE CONTRAST ENEMA  TECHNIQUE: Initial scout AP supine abdominal image obtained. Water-soluble contrast (Omnipaque 300) was introduced into the colon in a retrograde fashion and refluxed from the rectum to the cecum. Spot images of the colon followed by overhead radiographs were obtained.  FLUOROSCOPY TIME:  1 min 43 seconds  COMPARISON:  CT abdomen and pelvis 10/16/2013  FINDINGS: Scout abdominal radiograph demonstrates 2 surgical drains in place,  terminating in the left lower quadrant. There is mild gaseous distension of small and large bowel. A small amount of residual oral contrast is noted in the colon.  Contrast refluxed readily into the rectum and distal colon. Multiple sigmoid diverticula were seen. The contour of the sigmoid colon was irregular, with the sigmoid colon demonstrating less distension by contrast compared to other portions of the colon, compatible with inflammation and wall thickening seen on prior CTs. The medial margin of the proximal to mid sigmoid colon was particularly irregular with evidence of progressive accumulation of extraluminal contrast along this margin and collecting posteriorly.  Scattered diverticula were noted in the descending colon. Contrast was refluxed to the level of the cecum. Retained fecal material was noted in the cecum and ascending colon. Allowing for this, limited evaluation of the transverse and more proximal colon demonstrated a normal contour.  Post evacuation overhead radiograph demonstrates a large amount of retained contrast throughout the colon with faint extravasated contrast in the left lower quadrant as  previously described.  IMPRESSION: Contrast extravasation from the sigmoid colon consistent with persistent leak/ perforation related to diverticulitis.  These results will be called to the ordering clinician or representative by the Radiologist Assistant, and communication documented in the PACS or zVision Dashboard.   Electronically Signed   By: Sebastian Ache   On: 10/17/2013 11:21    Microbiology: No results found for this or any previous visit (from the past 240 hour(s)).   Labs: Basic Metabolic Panel: No results found for this basename: NA, K, CL, CO2, GLUCOSE, BUN, CREATININE, CALCIUM, MG, PHOS,  in the last 168 hours Liver Function Tests: No results found for this basename: AST, ALT, ALKPHOS, BILITOT, PROT, ALBUMIN,  in the last 168 hours No results found for this basename: LIPASE, AMYLASE,  in the last 168 hours No results found for this basename: AMMONIA,  in the last 168 hours CBC: No results found for this basename: WBC, NEUTROABS, HGB, HCT, MCV, PLT,  in the last 168 hours Cardiac Enzymes: No results found for this basename: CKTOTAL, CKMB, CKMBINDEX, TROPONINI,  in the last 168 hours BNP: BNP (last 3 results) No results found for this basename: PROBNP,  in the last 8760 hours CBG: No results found for this basename: GLUCAP,  in the last 168 hours  Active Problems:   Diverticulitis of large intestine with perforation and abscess   Time coordinating discharge: <30 mins   Signed:  Malikye Reppond, ANP-BC

## 2013-11-08 NOTE — Discharge Summary (Signed)
General Surgery [facility]  Sent to me for signature.  Velora Hecklerodd M. Mazie Fencl, MD, [facility] Office: [phone]

## 2013-11-09 ENCOUNTER — Encounter (INDEPENDENT_AMBULATORY_CARE_PROVIDER_SITE_OTHER): Payer: Self-pay | Admitting: General Surgery

## 2013-11-09 ENCOUNTER — Ambulatory Visit (INDEPENDENT_AMBULATORY_CARE_PROVIDER_SITE_OTHER): Payer: BC Managed Care – PPO | Admitting: General Surgery

## 2013-11-09 VITALS — BP 102/68 | HR 78 | Temp 97.3°F | Resp 16 | Ht 71.5 in | Wt 157.2 lb

## 2013-11-09 DIAGNOSIS — Z09 Encounter for follow-up examination after completed treatment for conditions other than malignant neoplasm: Secondary | ICD-10-CM

## 2013-11-09 NOTE — Patient Instructions (Signed)
May shower and get the incision and or colostomy wet Change dry gauze dressing over incision daily and can leave open when completely healed( one to 2 weeks) No diet restrictions. Plan return to work on September 7

## 2013-11-09 NOTE — Progress Notes (Signed)
Chief complaint: Followup Hartmann colectomy  History: Patient returns for followup status post Luz BrazenHartman procedure and colostomy for perforated diverticulitis with surgery date of 10/18/2013. Prior to this he had percutaneous drainage which failed and laparoscopic washout which also failed. He is generally getting along well at home. No abdominal pain or fever. Strength is returning. He's had some typical issues with ostomy device fitting which the home health nurse is helping him with and  seems about resolved. Exam: BP 102/68  Pulse 78  Temp(Src) 97.3 F (36.3 C) (Temporal)  Resp 16  Ht 5' 11.5" (1.816 m)  Wt 157 lb 3.2 oz (71.305 kg)  BMI 21.62 kg/m2 General: Appears well Abdomen: Soft and nontender. Midline incision which had been left open shows just a 1-2 mm strip of clean granulation tissue.  Assessment and plan: Recovered well following Hartmann colectomy. He would like to return to work and should be ready for second week in September. We reviewed wound care which should only be necessary for another week or 2. Return in 6 weeks.

## 2013-11-15 ENCOUNTER — Telehealth (INDEPENDENT_AMBULATORY_CARE_PROVIDER_SITE_OTHER): Payer: Self-pay

## 2013-11-15 NOTE — Telephone Encounter (Signed)
[facility] stating pt is [facility]. Pt and family independent with wd care. Wd care going well and also his ostomy teaching. Will be changing Saturday visit to Monday and [facility] more week if needed. Luther ParodyCaitlin will send form to have Dr Johna SheriffHoxworth sign if need to [facility] for another week.

## 2013-11-20 NOTE — Telephone Encounter (Signed)
[facility] called to let Dr Johna SheriffHoxworth know that they will be discharging the patient. Pts wound looks good and has been driving and getting out of the house more often. Luther ParodyCaitlin states that she will advise the pt to call our office if anything comes up and he needs us.

## 2013-11-26 ENCOUNTER — Telehealth (INDEPENDENT_AMBULATORY_CARE_PROVIDER_SITE_OTHER): Payer: Self-pay | Admitting: *Deleted

## 2013-11-26 NOTE — Telephone Encounter (Signed)
I think enterostomal therapy is who he needs to see

## 2013-11-26 NOTE — Telephone Encounter (Signed)
Ok.  Where is that normally set up at?    Please advise!  Victorino Dike

## 2013-11-26 NOTE — Telephone Encounter (Signed)
Pt called with concern re his stoma.  He states he is having a hard time wearing a bandage and without wearing a bandage, he cannot wear a shirt.  He is scheduled to return to work 12-10-13, but wants to be looked at before his f/u appt 12-21-13 to make sure he is ready.  He states around his stoma is pink, no swelling, no fever, no drainage.  He is having bowel movements regular.  I advised pt that pink color was nothing to be concerned about.  I advised pt I would send Dr. Johna Sheriff and his assistant a message to see about getting him worked in sooner and someone would get back with him.   Pt verbalized understanding.  Randy Park

## 2013-12-03 ENCOUNTER — Ambulatory Visit (INDEPENDENT_AMBULATORY_CARE_PROVIDER_SITE_OTHER): Payer: BC Managed Care – PPO | Admitting: General Surgery

## 2013-12-03 VITALS — BP 96/68 | HR 74 | Temp 97.5°F | Ht 71.0 in | Wt 160.0 lb

## 2013-12-03 DIAGNOSIS — Z09 Encounter for follow-up examination after completed treatment for conditions other than malignant neoplasm: Secondary | ICD-10-CM | POA: Insufficient documentation

## 2013-12-03 NOTE — Progress Notes (Signed)
Subjective:     Patient ID: Randy Park, male   DOB: 09/21/1960, 53 y.o.   MRN: [id number]  HPI Patient comes in with concern about protrusion and swelling around the stoma site.  Review of Systems     Objective:   Physical Exam There is no prolapse of the stoma. There may be some weakness around the colostomy site but no herniation.    Assessment:     Minor postoperative changes from the midline wound which is healing well and the colostomy in the left lower quadrant.     Plan:     The patient continues to have pain from his surgery and the like his disability extended release until he is due to see his primary surgeon on September 18.

## 2013-12-21 ENCOUNTER — Encounter (INDEPENDENT_AMBULATORY_CARE_PROVIDER_SITE_OTHER): Payer: BC Managed Care – PPO | Admitting: General Surgery

## 2014-02-01 ENCOUNTER — Encounter (HOSPITAL_COMMUNITY): Payer: Self-pay | Admitting: Emergency Medicine

## 2014-02-01 ENCOUNTER — Emergency Department (HOSPITAL_COMMUNITY)
Admission: EM | Admit: 2014-02-01 | Discharge: 2014-02-01 | Disposition: A | Discharge status: Home / Self Care | Payer: BC Managed Care – PPO | Attending: Emergency Medicine | Admitting: Emergency Medicine

## 2014-02-01 ENCOUNTER — Telehealth (INDEPENDENT_AMBULATORY_CARE_PROVIDER_SITE_OTHER): Payer: Self-pay | Admitting: General Surgery

## 2014-02-01 DIAGNOSIS — Z8719 Personal history of other diseases of the digestive system: Secondary | ICD-10-CM | POA: Diagnosis not present

## 2014-02-01 DIAGNOSIS — Z720 Tobacco use: Secondary | ICD-10-CM | POA: Insufficient documentation

## 2014-02-01 DIAGNOSIS — Z86010 Personal history of colonic polyps: Secondary | ICD-10-CM | POA: Insufficient documentation

## 2014-02-01 DIAGNOSIS — Z79899 Other long term (current) drug therapy: Secondary | ICD-10-CM | POA: Diagnosis not present

## 2014-02-01 DIAGNOSIS — Z880 Allergy status to penicillin: Secondary | ICD-10-CM | POA: Diagnosis not present

## 2014-02-01 DIAGNOSIS — T8189XA Other complications of procedures, not elsewhere classified, initial encounter: Secondary | ICD-10-CM

## 2014-02-01 DIAGNOSIS — Z4801 Encounter for change or removal of surgical wound dressing: Secondary | ICD-10-CM | POA: Diagnosis present

## 2014-02-01 DIAGNOSIS — K9409 Other complications of colostomy: Secondary | ICD-10-CM | POA: Diagnosis not present

## 2014-02-01 MED ORDER — MUPIROCIN CALCIUM 2 % EX CREA: 1.0000 "application " | TOPICAL_CREAM | Freq: Two times a day (BID) | CUTANEOUS | Status: DC

## 2014-02-01 NOTE — Telephone Encounter (Signed)
Patient called in explaining that he would like to be seen today urgently.  He is s/p colon resection on 10/18/13 and is now complaining of a possible infection at the incision site.  he says he can see yellow/green pus underlying the skin at the incision site, he has a pain level of 6 on a 0-10 scale, no fevers, no chills, and no drainge.  Informed him that we do not have any opening in our urgent office clinic today but that I could work him in on Monday.  The patient said that he didnt believe he could wait that long because he was incredibly concerned about this.  Suggested that he should go to the ED and the patient agreed with this POC.

## 2014-02-01 NOTE — ED Provider Notes (Signed)
CSN: [id number]     Arrival date & time 02/01/14  1214 History  This chart was scribed for non-physician practitioner, Langston MaskerKaren Sofia, PA-C working with Geoffery Lyonsouglas Delo, MD by Greggory StallionKayla Andersen, ED scribe. This patient was seen in room WTR8/WTR8 and the patient's care was started at 12:39 PM.   Chief Complaint  Patient presents with  . Wound Check   Patient is a 53 y.o. male presenting with wound check. The history is provided by the patient. No language interpreter was used.  Wound Check This is a new problem. The current episode started 2 days ago. The problem occurs constantly. The problem has not changed since onset.Nothing aggravates the symptoms. Nothing relieves the symptoms. He has tried nothing for the symptoms.   HPI Comments: Randy Park is a 53 y.o. male who presents to the Emergency Department for wound check. States he had a colostomy placed in July but the area around it started looking infected about 2 days ago. Reports some color change and irritation. Pt notified Dr. Johna SheriffHoxworth and was told to come to the ED for evaluation since he didn't have any openings today. Pt's next appointment with him is 02/22/14.   Past Medical History  Diagnosis Date  . GERD (gastroesophageal reflux disease)   . Diverticulosis   . Hiatal hernia   . Hyperplastic colonic polyp - colonoscopy 2008 08/26/2007    Qualifier: Diagnosis of  By: Alesia RichardsSmith NCMA, Barbara    . COLITIS 08/26/2007    Qualifier: Diagnosis of  By: Alesia RichardsSmith NCMA, Barbara     Past Surgical History  Procedure Laterality Date  . Head surgery    . Laparoscopy N/A 10/08/2013    Procedure: LAPAROSCOPIC LYSIS OF ADHESIONS, DRAINAGE OF ABDOMINAL ABSCESS X2;  Surgeon: Ardeth SportsmanSteven C. Gross, MD;  Location: [facility];  Service: General;  Laterality: N/A;  . Colon resection N/A 10/18/2013    Procedure: COLON RESECTION;  Surgeon: Mariella SaaBenjamin T Hoxworth, MD;  Location: [facility];  Service: General;  Laterality: N/A;  . Colostomy N/A 10/18/2013    Procedure: HARTMANNS  PROCEDURE, COLOSTOMY;  Surgeon: Mariella SaaBenjamin T Hoxworth, MD;  Location: [facility];  Service: General;  Laterality: N/A;  . Colon surgery     Family History  Problem Relation Age of Onset  . Heart disease Mother    History  Substance Use Topics  . Smoking status: Current Every Day Smoker -- 2.00 packs/day    Types: Cigarettes  . Smokeless tobacco: Never Used  . Alcohol Use: Yes     Comment: social    Review of Systems  Skin: Positive for color change.  All other systems reviewed and are negative.  Allergies  Amoxicillin  Home Medications   Prior to Admission medications   Medication Sig Start Date End Date Taking? Authorizing Provider  Multiple Vitamin (MULTIVITAMIN WITH MINERALS) TABS tablet Take 1 tablet by mouth daily. 10/26/13   Megan N Dort, PA-C   BP 137/79  Pulse 73  Temp(Src) 98.3 F (36.8 C) (Oral)  Resp 18  SpO2 100%  Physical Exam  Nursing note and vitals reviewed. Constitutional: He is oriented to person, place, and time. He appears well-developed and well-nourished.  HENT:  Head: Normocephalic.  Eyes: EOM are normal.  Neck: Normal range of motion.  Pulmonary/Chest: Effort normal.  Abdominal: He exhibits no distension.  Musculoskeletal: Normal range of motion.  Neurological: He is alert and oriented to person, place, and time.  Skin:  Well healed incision. 5 x 7 mm slightly erythematous area. No drainage.  No streaking.   Psychiatric: He has a normal mood and affect.   ED Course  Procedures (including critical care time)  DIAGNOSTIC STUDIES: Oxygen Saturation is 100% on RA, normal by my interpretation.    COORDINATION OF CARE: 12:46 PM-Discussed treatment plan which includes Bactroban with pt at bedside and pt agreed to plan. Advised pt to follow up with Dr. Johna SheriffHoxworth.   Labs Review Labs Reviewed - No data to display  Imaging Review No results found.   EKG Interpretation None      MDM   Final diagnoses:  Incisional irritation, initial  encounter      I personally performed the services described in this documentation, which was scribed in my presence. The recorded information has been reviewed and is accurate.  Elson AreasLeslie K Sofia, PA-C 02/01/14 1254

## 2014-02-01 NOTE — ED Provider Notes (Signed)
Medical screening examination/treatment/procedure(s) were performed by non-physician practitioner and as supervising physician I was immediately available for consultation/collaboration.     Benno Brensinger, MD 02/01/14 1531 

## 2014-02-01 NOTE — ED Notes (Signed)
Per Pt, he had colostomy placed in July.  Pt wound around site has started looking "infected".  Noticed yesterday and notified Hoxworth today.  Told to come to Emergency for evaluation of site.

## 2014-02-01 NOTE — Discharge Instructions (Signed)
See Dr. Marvene StaffHoxwoth as scheduled.  Try to keep area from being rubbed by clothing.

## 2014-02-01 NOTE — ED Notes (Addendum)
Pt reports incision from resection has scabbed area that is yellow/green in color since Tuesday. Site clean, dry, intact. No drainage present. Pt reports normal for scab to be there but noticed change in color this week. Pt reports pain at site. Pt denies change in stool color in colostomy pouch.

## 2014-03-16 ENCOUNTER — Encounter (HOSPITAL_COMMUNITY): Payer: Self-pay | Admitting: Emergency Medicine

## 2014-03-16 ENCOUNTER — Emergency Department (HOSPITAL_COMMUNITY)
Admission: EM | Admit: 2014-03-16 | Discharge: 2014-03-16 | Discharge status: Against Medical Advice | Payer: BC Managed Care – PPO | Attending: Emergency Medicine | Admitting: Emergency Medicine

## 2014-03-16 DIAGNOSIS — Y9389 Activity, other specified: Secondary | ICD-10-CM | POA: Diagnosis not present

## 2014-03-16 DIAGNOSIS — Y92009 Unspecified place in unspecified non-institutional (private) residence as the place of occurrence of the external cause: Secondary | ICD-10-CM | POA: Diagnosis not present

## 2014-03-16 DIAGNOSIS — Z720 Tobacco use: Secondary | ICD-10-CM | POA: Diagnosis not present

## 2014-03-16 DIAGNOSIS — W228XXA Striking against or struck by other objects, initial encounter: Secondary | ICD-10-CM | POA: Diagnosis not present

## 2014-03-16 DIAGNOSIS — S30811A Abrasion of abdominal wall, initial encounter: Secondary | ICD-10-CM | POA: Diagnosis not present

## 2014-03-16 DIAGNOSIS — Y998 Other external cause status: Secondary | ICD-10-CM | POA: Diagnosis not present

## 2014-03-16 NOTE — ED Notes (Signed)
Pt from home c/o an abrasion from carrying a box and it rubbed against his stoma (pt has colostomy). Small amount of blood to area around stoma.

## 2014-03-27 ENCOUNTER — Emergency Department (HOSPITAL_COMMUNITY)
Admission: EM | Admit: 2014-03-27 | Discharge: 2014-03-27 | Disposition: A | Discharge status: Home / Self Care | Payer: BC Managed Care – PPO | Attending: Emergency Medicine | Admitting: Emergency Medicine

## 2014-03-27 ENCOUNTER — Encounter (HOSPITAL_COMMUNITY): Payer: Self-pay | Admitting: *Deleted

## 2014-03-27 DIAGNOSIS — W228XXA Striking against or struck by other objects, initial encounter: Secondary | ICD-10-CM | POA: Diagnosis not present

## 2014-03-27 DIAGNOSIS — Z720 Tobacco use: Secondary | ICD-10-CM | POA: Diagnosis not present

## 2014-03-27 DIAGNOSIS — Y998 Other external cause status: Secondary | ICD-10-CM | POA: Diagnosis not present

## 2014-03-27 DIAGNOSIS — Z8719 Personal history of other diseases of the digestive system: Secondary | ICD-10-CM | POA: Insufficient documentation

## 2014-03-27 DIAGNOSIS — Z933 Colostomy status: Secondary | ICD-10-CM | POA: Diagnosis not present

## 2014-03-27 DIAGNOSIS — S3991XA Unspecified injury of abdomen, initial encounter: Secondary | ICD-10-CM | POA: Insufficient documentation

## 2014-03-27 DIAGNOSIS — Y9389 Activity, other specified: Secondary | ICD-10-CM | POA: Diagnosis not present

## 2014-03-27 DIAGNOSIS — Y9289 Other specified places as the place of occurrence of the external cause: Secondary | ICD-10-CM | POA: Insufficient documentation

## 2014-03-27 DIAGNOSIS — Z880 Allergy status to penicillin: Secondary | ICD-10-CM | POA: Insufficient documentation

## 2014-03-27 DIAGNOSIS — IMO0002 Reserved for concepts with insufficient information to code with codable children: Secondary | ICD-10-CM

## 2014-03-27 MED ORDER — HYDROCODONE-ACETAMINOPHEN 5-325 MG PO TABS: 1.0000 | ORAL_TABLET | ORAL | Status: DC | PRN

## 2014-03-27 NOTE — ED Notes (Signed)
Pt reports hitting his colostomy stoma while holding a box and ran in to his bed.  Pt reports he was here x 2 weeks ago when it happened but was not seen d/t wait.  Pt reports the stoma's been swollen since.  Pt reports soreness.

## 2014-03-27 NOTE — ED Provider Notes (Signed)
CSN: [id number]     Arrival date & time 03/27/14  2058 History   First MD Initiated Contact with Patient 03/27/14 2154     Chief Complaint  Patient presents with  . Abdominal Pain   (Consider location/radiation/quality/duration/timing/severity/associated sxs/prior Treatment) HPI  Randy Park is a 53 yo male presenting with injury to stoma 2 weeks ago.  He reports he was carrying a box and bumped into his bed causing the corner of the box to push into his colostomy bag.  He state there was some bleeding at the time of the injury and he thinks it seemed more swollen than usual.  He came to the ED that night but left without being seen.  There has not been any new injury, pain or concern but he wants it to be evaluated because the earliest appointment with his surgeon is Jan 20th.  He denies any changes with output, abd pain, nausea, vomiting or further bleeding to the stoma.    Past Medical History  Diagnosis Date  . GERD (gastroesophageal reflux disease)   . Diverticulosis   . Hiatal hernia   . Hyperplastic colonic polyp - colonoscopy 2008 08/26/2007    Qualifier: Diagnosis of  By: Alesia RichardsSmith NCMA, Barbara    . COLITIS 08/26/2007    Qualifier: Diagnosis of  By: Alesia RichardsSmith NCMA, Barbara     Past Surgical History  Procedure Laterality Date  . Head surgery    . Laparoscopy N/A 10/08/2013    Procedure: LAPAROSCOPIC LYSIS OF ADHESIONS, DRAINAGE OF ABDOMINAL ABSCESS X2;  Surgeon: Ardeth SportsmanSteven C. Gross, MD;  Location: [facility];  Service: General;  Laterality: N/A;  . Colon resection N/A 10/18/2013    Procedure: COLON RESECTION;  Surgeon: Mariella SaaBenjamin T Hoxworth, MD;  Location: [facility];  Service: General;  Laterality: N/A;  . Colostomy N/A 10/18/2013    Procedure: HARTMANNS PROCEDURE, COLOSTOMY;  Surgeon: Mariella SaaBenjamin T Hoxworth, MD;  Location: [facility];  Service: General;  Laterality: N/A;  . Colon surgery     Family History  Problem Relation Age of Onset  . Heart disease Mother    History  Substance Use Topics   . Smoking status: Current Every Day Smoker -- 1.00 packs/day    Types: Cigarettes  . Smokeless tobacco: Never Used  . Alcohol Use: Yes     Comment: social    Review of Systems  Constitutional: Negative for fever and chills.  HENT: Negative for sore throat.   Eyes: Negative for visual disturbance.  Respiratory: Negative for cough and shortness of breath.   Cardiovascular: Negative for chest pain and leg swelling.  Gastrointestinal: Negative for nausea, vomiting and diarrhea.  Genitourinary: Negative for dysuria.  Musculoskeletal: Negative for myalgias.  Skin: Positive for wound. Negative for rash.  Neurological: Negative for weakness, numbness and headaches.    Allergies  Amoxicillin  Home Medications   Prior to Admission medications   Medication Sig Start Date End Date Taking? Authorizing Provider  acetaminophen (TYLENOL) 500 MG tablet Take 1,000 mg by mouth every 6 (six) hours as needed for moderate pain or headache.   Yes Historical Provider, MD  Multiple Vitamin (MULTIVITAMIN WITH MINERALS) TABS tablet Take 1 tablet by mouth daily. 10/26/13  Yes Megan N Dort, PA-C  mupirocin cream (BACTROBAN) 2 % Apply 1 application topically 2 (two) times daily. Patient not taking: Reported on 03/27/2014 02/01/14   Elson AreasLeslie K Sofia, PA-C   BP 116/73 mmHg  Pulse 82  Temp(Src) 97.6 F (36.4 C) (Oral)  Resp 18  SpO2  99% Physical Exam  Constitutional: He appears well-developed and well-nourished. No distress.  HENT:  Head: Normocephalic and atraumatic.  Mouth/Throat: Oropharynx is clear and moist. No oropharyngeal exudate.  Eyes: Conjunctivae are normal.  Neck: Neck supple. No thyromegaly present.  Cardiovascular: Normal rate, regular rhythm and intact distal pulses.   Pulmonary/Chest: Effort normal and breath sounds normal. No respiratory distress.  Abdominal: Soft. There is no tenderness.    Musculoskeletal: He exhibits no tenderness.  Lymphadenopathy:    He has no cervical  adenopathy.  Neurological: He is alert.  Skin: Skin is warm and dry. No rash noted. He is not diaphoretic.  Psychiatric: He has a normal mood and affect.  Nursing note and vitals reviewed.   ED Course  Procedures (including critical care time) Labs Review Labs Reviewed - No data to display  Imaging Review No results found.   EKG Interpretation None      MDM   Final diagnoses:  H/O colostomy   53 yo with report of superficial injury to stoma 2 weeks ago.  Pt presented for reassurance that the stoma is ok. No new injury. Stoma is well-appearing and has been funtioning normally since the injury. His skin is intact peripherally and he has no abd pain or nausea, vomiting or diarrhea.  Pt is well-appearing, in no acute distress and vital signs are stable.  They appear safe to be discharged.  Discharge include follow-up with Dr. Johna SheriffHoxworth.  Return precautions provided. Pt aware of plan and in agreement.   Filed Vitals:   03/27/14 2114 03/27/14 2258  BP: 116/73 117/62  Pulse: 82 77  Temp: 97.6 F (36.4 C)   TempSrc: Oral   Resp: 18 16  SpO2: 99% 96%   Meds given in ED:  Medications - No data to display  Discharge Medication List as of 03/27/2014 10:58 PM         Harle BattiestElizabeth Shila Kruczek, NP 03/28/14 1535  Tilden FossaElizabeth Rees, MD 03/30/14 [phone]

## 2014-03-27 NOTE — Discharge Instructions (Signed)
Please follow the directions provided.  Be sure to follow-up with Dr. Johna SheriffHoxworth for further evaluation.  Your stoma is well-appearing and does not show any significant injury or signs of infection.  Continue your stoma care as directed.  You may take the pain meds as needed. Please return for any new, worsening or concerning symptoms.    SEEK IMMEDIATE MEDICAL CARE IF:  You notice a change in the size or color of the stoma, especially if it becomes very red, purple, black, or pale white.  You have bloody stools or bleeding from the stoma.  You have abdominal pain, nausea, vomiting, or bloating.  There is anything unusual protruding from the stoma.  You have irritation or red skin around the stoma.  No stool is passing from the stoma.  You have diarrhea (requiring more frequent than normal pouch emptying).

## 2014-05-31 ENCOUNTER — Other Ambulatory Visit (INDEPENDENT_AMBULATORY_CARE_PROVIDER_SITE_OTHER): Payer: Self-pay

## 2014-05-31 ENCOUNTER — Other Ambulatory Visit (INDEPENDENT_AMBULATORY_CARE_PROVIDER_SITE_OTHER): Payer: Self-pay | Admitting: General Surgery

## 2014-05-31 DIAGNOSIS — Z933 Colostomy status: Secondary | ICD-10-CM

## 2014-06-13 ENCOUNTER — Ambulatory Visit
Admission: RE | Admit: 2014-06-13 | Discharge: 2014-06-13 | Disposition: A | Discharge status: Home / Self Care | Payer: BLUE CROSS/BLUE SHIELD | Source: Ambulatory Visit | Attending: General Surgery | Admitting: General Surgery

## 2014-07-02 ENCOUNTER — Encounter: Payer: Self-pay | Admitting: Gastroenterology

## 2014-07-04 NOTE — Patient Instructions (Addendum)
Randy Park  07/04/2014   Your procedure is scheduled on: 07/09/14   Report to [facility] Main  Entrance and follow signs to               Short Stay Center at 11:30 AM.   Call this number if you have problems the morning of surgery [phone]   Remember:  Do not eat food  :After Midnight.              MAY HAVE CLEAR LIQUIDS UNTIL 7:30 AM    CLEAR LIQUID DIET   Foods Allowed                                                                     Foods Excluded  Coffee and tea, regular and decaf                             liquids that you cannot  Plain Jell-O in any flavor                                             see through such as: Fruit ices (not with fruit pulp)                                     milk, soups, orange juice  Iced Popsicles                                                 All solid food Carbonated beverages, regular and diet                                    Cranberry, grape and apple juices Sports drinks like Gatorade Lightly seasoned clear broth or consume(fat free) Sugar, honey syrup  _____________________________________________________________________    Take these medicines the morning of surgery with A SIP OF WATER: NONE                               You may not have any metal on your body including hair pins and              piercings  Do not wear jewelry, make-up, lotions, powders or perfumes.             Do not wear nail polish.  Do not shave  48 hours prior to surgery.              Men may shave face and neck.   Do not bring valuables to the hospital. [facility] IS NOT             RESPONSIBLE   FOR VALUABLES.  Contacts, dentures  or bridgework may not be worn into surgery.  Leave suitcase in the car. After surgery it may be brought to your room.     Patients discharged the day of surgery will not be allowed to drive home.  Name and phone number of your driver:  Special Instructions: N/A               Please read over the following fact sheets you were given: _____________________________________________________________________                                                     [facility] - PREPARING FOR SURGERY  Before surgery, you can play an important role.  Because skin is not sterile, your skin needs to be as free of germs as possible.  You can reduce the number of germs on your skin by washing with CHG (chlorahexidine gluconate) soap before surgery.  CHG is an antiseptic cleaner which kills germs and bonds with the skin to continue killing germs even after washing. Please DO NOT use if you have an allergy to CHG or antibacterial soaps.  If your skin becomes reddened/irritated stop using the CHG and inform your nurse when you arrive at Short Stay. Do not shave (including legs and underarms) for at least 48 hours prior to the first CHG shower.  You may shave your face. Please follow these instructions carefully:   1.  Shower with CHG Soap the night before surgery and the  morning of Surgery.   2.  If you choose to wash your hair, wash your hair first as usual with your  normal  Shampoo.   3.  After you shampoo, rinse your hair and body thoroughly to remove the  shampoo.                                         4.  Use CHG as you would any other liquid soap.  You can apply chg directly  to the skin and wash . Gently wash with scrungie or clean wascloth    5.  Apply the CHG Soap to your body ONLY FROM THE NECK DOWN.   Do not use on open                           Wound or open sores. Avoid contact with eyes, ears mouth and genitals (private parts).                        Genitals (private parts) with your normal soap.              6.  Wash thoroughly, paying special attention to the area where your surgery  will be performed.   7.  Thoroughly rinse your body with warm water from the neck down.   8.  DO NOT shower/wash with your normal soap after using and rinsing off  the CHG  Soap .                9.  Pat yourself dry with a clean towel.             10.  Wear clean pajamas.  11.  Place clean sheets on your bed the night of your first shower and do not  sleep with pets.  Day of Surgery : Do not apply any lotions/deodorants the morning of surgery.  Please wear clean clothes to the hospital/surgery center.  FAILURE TO FOLLOW THESE INSTRUCTIONS MAY RESULT IN THE CANCELLATION OF YOUR SURGERY    PATIENT SIGNATURE_________________________________  ______________________________________________________________________

## 2014-07-08 ENCOUNTER — Encounter (HOSPITAL_COMMUNITY)
Admission: RE | Admit: 2014-07-08 | Discharge: 2014-07-08 | Disposition: A | Discharge status: Home / Self Care | Payer: BLUE CROSS/BLUE SHIELD | Source: Ambulatory Visit | Attending: General Surgery | Admitting: General Surgery

## 2014-07-08 ENCOUNTER — Encounter (HOSPITAL_COMMUNITY): Payer: Self-pay

## 2014-07-08 DIAGNOSIS — Z01812 Encounter for preprocedural laboratory examination: Secondary | ICD-10-CM

## 2014-07-08 HISTORY — DX: Headache: R51

## 2014-07-08 HISTORY — DX: Headache, unspecified: R51.9

## 2014-07-08 LAB — CBC
HCT: 46 % (ref 39.0–52.0)
Hemoglobin: 15.5 g/dL (ref 13.0–17.0)
MCH: 32.6 pg (ref 26.0–34.0)
MCHC: 33.7 g/dL (ref 30.0–36.0)
MCV: 96.8 fL (ref 78.0–100.0)
Platelets: 282 10*3/uL (ref 150–400)
RBC: 4.75 MIL/uL (ref 4.22–5.81)
RDW: 13.1 % (ref 11.5–15.5)
WBC: 7.5 10*3/uL (ref 4.0–10.5)

## 2014-07-08 LAB — BASIC METABOLIC PANEL
Anion gap: 7 (ref 5–15)
BUN: 19 mg/dL (ref 6–23)
CO2: 28 mmol/L (ref 19–32)
Calcium: 9.4 mg/dL (ref 8.4–10.5)
Chloride: 104 mmol/L (ref 96–112)
Creatinine, Ser: 1.34 mg/dL (ref 0.50–1.35)
GFR, EST AFRICAN AMERICAN: 69 mL/min — AB (ref >90)
GFR, EST NON AFRICAN AMERICAN: 60 mL/min — AB (ref >90)
Glucose, Bld: 104 mg/dL — ABNORMAL HIGH (ref 70–99)
POTASSIUM: 5.1 mmol/L (ref 3.5–5.1)
SODIUM: 139 mmol/L (ref 135–145)

## 2014-07-08 LAB — ABO/RH: ABO/RH(D): O POS

## 2014-07-09 ENCOUNTER — Inpatient Hospital Stay (HOSPITAL_COMMUNITY): Payer: BLUE CROSS/BLUE SHIELD | Admitting: Certified Registered Nurse Anesthetist

## 2014-07-09 ENCOUNTER — Encounter (HOSPITAL_COMMUNITY): Payer: Self-pay | Admitting: *Deleted

## 2014-07-09 ENCOUNTER — Encounter (HOSPITAL_COMMUNITY)
Admission: RE | Disposition: A | Discharge status: Home / Self Care | Payer: Self-pay | Source: Ambulatory Visit | Attending: General Surgery

## 2014-07-09 ENCOUNTER — Inpatient Hospital Stay (HOSPITAL_COMMUNITY)
Admission: RE | Admit: 2014-07-09 | Discharge: 2014-07-15 | DRG: 354 | Disposition: A | Discharge status: Home / Self Care | Payer: BLUE CROSS/BLUE SHIELD | Source: Ambulatory Visit | Attending: General Surgery | Admitting: General Surgery

## 2014-07-09 DIAGNOSIS — K567 Ileus, unspecified: Secondary | ICD-10-CM | POA: Diagnosis not present

## 2014-07-09 DIAGNOSIS — Z803 Family history of malignant neoplasm of breast: Secondary | ICD-10-CM | POA: Diagnosis not present

## 2014-07-09 DIAGNOSIS — Z87891 Personal history of nicotine dependence: Secondary | ICD-10-CM

## 2014-07-09 DIAGNOSIS — K219 Gastro-esophageal reflux disease without esophagitis: Secondary | ICD-10-CM | POA: Diagnosis present

## 2014-07-09 DIAGNOSIS — Z880 Allergy status to penicillin: Secondary | ICD-10-CM | POA: Diagnosis not present

## 2014-07-09 DIAGNOSIS — K5790 Diverticulosis of intestine, part unspecified, without perforation or abscess without bleeding: Secondary | ICD-10-CM | POA: Diagnosis present

## 2014-07-09 DIAGNOSIS — Z01812 Encounter for preprocedural laboratory examination: Secondary | ICD-10-CM | POA: Diagnosis not present

## 2014-07-09 DIAGNOSIS — Z933 Colostomy status: Secondary | ICD-10-CM

## 2014-07-09 DIAGNOSIS — Z79899 Other long term (current) drug therapy: Secondary | ICD-10-CM | POA: Diagnosis not present

## 2014-07-09 DIAGNOSIS — Z433 Encounter for attention to colostomy: Secondary | ICD-10-CM | POA: Diagnosis present

## 2014-07-09 HISTORY — PX: COLOSTOMY TAKEDOWN: SHX5783

## 2014-07-09 LAB — TYPE AND SCREEN
ABO/RH(D): O POS
Antibody Screen: NEGATIVE

## 2014-07-09 SURGERY — CLOSURE, COLOSTOMY
Anesthesia: General

## 2014-07-09 MED ORDER — METRONIDAZOLE IN NACL 5-0.79 MG/ML-% IV SOLN
500.0000 mg | Freq: Three times a day (TID) | INTRAVENOUS | Status: DC
  Administered 2014-07-09: 500 mg via INTRAVENOUS

## 2014-07-09 MED ORDER — CISATRACURIUM BESYLATE (PF) 10 MG/5ML IV SOLN
INTRAVENOUS | Status: DC | PRN
  Administered 2014-07-09: 2 mg via INTRAVENOUS
  Administered 2014-07-09: 6 mg via INTRAVENOUS
  Administered 2014-07-09: 4 mg via INTRAVENOUS

## 2014-07-09 MED ORDER — 0.9 % SODIUM CHLORIDE (POUR BTL) OPTIME
TOPICAL | Status: DC | PRN
  Administered 2014-07-09: 3000 mL

## 2014-07-09 MED ORDER — HYDROMORPHONE HCL 1 MG/ML IJ SOLN
INTRAMUSCULAR | Status: AC
  Filled 2014-07-09: qty 1

## 2014-07-09 MED ORDER — FENTANYL CITRATE 0.05 MG/ML IJ SOLN
INTRAMUSCULAR | Status: AC
  Filled 2014-07-09: qty 2

## 2014-07-09 MED ORDER — CIPROFLOXACIN IN D5W 400 MG/200ML IV SOLN
INTRAVENOUS | Status: AC
  Filled 2014-07-09: qty 200

## 2014-07-09 MED ORDER — ONDANSETRON HCL 4 MG/2ML IJ SOLN
4.0000 mg | Freq: Four times a day (QID) | INTRAMUSCULAR | Status: DC | PRN
  Administered 2014-07-11: 4 mg via INTRAVENOUS
  Filled 2014-07-09 (×2): qty 2

## 2014-07-09 MED ORDER — CHLORHEXIDINE GLUCONATE 4 % EX LIQD: 1.0000 "application " | Freq: Once | CUTANEOUS | Status: DC

## 2014-07-09 MED ORDER — FENTANYL CITRATE 0.05 MG/ML IJ SOLN
INTRAMUSCULAR | Status: AC
  Filled 2014-07-09: qty 5

## 2014-07-09 MED ORDER — ALVIMOPAN 12 MG PO CAPS
12.0000 mg | ORAL_CAPSULE | Freq: Two times a day (BID) | ORAL | Status: DC
  Administered 2014-07-10 – 2014-07-14 (×7): 12 mg via ORAL
  Filled 2014-07-09 (×10): qty 1

## 2014-07-09 MED ORDER — PROPOFOL 10 MG/ML IV BOLUS
INTRAVENOUS | Status: AC
  Filled 2014-07-09: qty 20

## 2014-07-09 MED ORDER — ALVIMOPAN 12 MG PO CAPS
12.0000 mg | ORAL_CAPSULE | Freq: Once | ORAL | Status: AC
  Administered 2014-07-09: 12 mg via ORAL
  Filled 2014-07-09: qty 1

## 2014-07-09 MED ORDER — KCL IN DEXTROSE-NACL 20-5-0.9 MEQ/L-%-% IV SOLN
INTRAVENOUS | Status: AC
  Filled 2014-07-09: qty 1000

## 2014-07-09 MED ORDER — DEXAMETHASONE SODIUM PHOSPHATE 10 MG/ML IJ SOLN
INTRAMUSCULAR | Status: AC
  Filled 2014-07-09: qty 1

## 2014-07-09 MED ORDER — ONDANSETRON HCL 4 MG/2ML IJ SOLN
INTRAMUSCULAR | Status: DC | PRN
Start: 2014-07-09 — End: 2014-07-09
  Administered 2014-07-09: 4 mg via INTRAVENOUS

## 2014-07-09 MED ORDER — NEOSTIGMINE METHYLSULFATE 10 MG/10ML IV SOLN
INTRAVENOUS | Status: DC | PRN
  Administered 2014-07-09: 4 mg via INTRAVENOUS

## 2014-07-09 MED ORDER — FENTANYL CITRATE 0.05 MG/ML IJ SOLN
INTRAMUSCULAR | Status: DC | PRN
  Administered 2014-07-09 (×6): 50 ug via INTRAVENOUS
  Administered 2014-07-09: 100 ug via INTRAVENOUS
  Administered 2014-07-09: 50 ug via INTRAVENOUS

## 2014-07-09 MED ORDER — HEPARIN SODIUM (PORCINE) 5000 UNIT/ML IJ SOLN
5000.0000 [IU] | Freq: Three times a day (TID) | INTRAMUSCULAR | Status: DC
  Administered 2014-07-09 – 2014-07-11 (×5): 5000 [IU] via SUBCUTANEOUS
  Filled 2014-07-09 (×8): qty 1

## 2014-07-09 MED ORDER — ONDANSETRON HCL 4 MG PO TABS: 4.0000 mg | ORAL_TABLET | Freq: Four times a day (QID) | ORAL | Status: DC | PRN

## 2014-07-09 MED ORDER — CISATRACURIUM BESYLATE 20 MG/10ML IV SOLN
INTRAVENOUS | Status: AC
  Filled 2014-07-09: qty 10

## 2014-07-09 MED ORDER — SUCCINYLCHOLINE CHLORIDE 20 MG/ML IJ SOLN
INTRAMUSCULAR | Status: DC | PRN
  Administered 2014-07-09: 100 mg via INTRAVENOUS

## 2014-07-09 MED ORDER — DEXAMETHASONE SODIUM PHOSPHATE 10 MG/ML IJ SOLN
INTRAMUSCULAR | Status: DC | PRN
  Administered 2014-07-09: 10 mg via INTRAVENOUS

## 2014-07-09 MED ORDER — MEPERIDINE HCL 50 MG/ML IJ SOLN: 6.2500 mg | INTRAMUSCULAR | Status: DC | PRN

## 2014-07-09 MED ORDER — GLYCOPYRROLATE 0.2 MG/ML IJ SOLN
INTRAMUSCULAR | Status: AC
  Filled 2014-07-09: qty 3

## 2014-07-09 MED ORDER — GLYCOPYRROLATE 0.2 MG/ML IJ SOLN
INTRAMUSCULAR | Status: DC | PRN
  Administered 2014-07-09: 0.6 mg via INTRAVENOUS

## 2014-07-09 MED ORDER — METRONIDAZOLE IN NACL 5-0.79 MG/ML-% IV SOLN
INTRAVENOUS | Status: AC
  Filled 2014-07-09: qty 100

## 2014-07-09 MED ORDER — ACETAMINOPHEN 10 MG/ML IV SOLN
1000.0000 mg | Freq: Once | INTRAVENOUS | Status: AC
  Administered 2014-07-09: 1000 mg via INTRAVENOUS
  Filled 2014-07-09: qty 100

## 2014-07-09 MED ORDER — ONDANSETRON HCL 4 MG/2ML IJ SOLN
INTRAMUSCULAR | Status: AC
  Filled 2014-07-09: qty 2

## 2014-07-09 MED ORDER — CIPROFLOXACIN IN D5W 400 MG/200ML IV SOLN
400.0000 mg | INTRAVENOUS | Status: AC
  Administered 2014-07-09: 400 mg via INTRAVENOUS

## 2014-07-09 MED ORDER — OXYCODONE-ACETAMINOPHEN 5-325 MG PO TABS
1.0000 | ORAL_TABLET | ORAL | Status: DC | PRN
  Administered 2014-07-11 – 2014-07-15 (×9): 2 via ORAL
  Filled 2014-07-09 (×9): qty 2

## 2014-07-09 MED ORDER — METRONIDAZOLE IN NACL 5-0.79 MG/ML-% IV SOLN
500.0000 mg | Freq: Once | INTRAVENOUS | Status: DC
  Filled 2014-07-09: qty 100

## 2014-07-09 MED ORDER — LACTATED RINGERS IV SOLN
INTRAVENOUS | Status: DC
  Administered 2014-07-09 (×2): via INTRAVENOUS
  Administered 2014-07-09: 1000 mL via INTRAVENOUS

## 2014-07-09 MED ORDER — PROPOFOL 10 MG/ML IV BOLUS
INTRAVENOUS | Status: DC | PRN
  Administered 2014-07-09: 200 mg via INTRAVENOUS

## 2014-07-09 MED ORDER — HYDROMORPHONE HCL 1 MG/ML IJ SOLN: 0.2500 mg | INTRAMUSCULAR | Status: DC | PRN

## 2014-07-09 MED ORDER — KCL IN DEXTROSE-NACL 20-5-0.9 MEQ/L-%-% IV SOLN
INTRAVENOUS | Status: DC
  Administered 2014-07-09: 125 mL/h via INTRAVENOUS
  Administered 2014-07-10: 1000 mL via INTRAVENOUS
  Filled 2014-07-09 (×3): qty 1000

## 2014-07-09 MED ORDER — HYDROMORPHONE HCL 1 MG/ML IJ SOLN
1.0000 mg | INTRAMUSCULAR | Status: DC | PRN
  Administered 2014-07-09 (×2): 1 mg via INTRAVENOUS
  Administered 2014-07-10 (×3): 2 mg via INTRAVENOUS
  Administered 2014-07-10: 1 mg via INTRAVENOUS
  Administered 2014-07-10 (×3): 2 mg via INTRAVENOUS
  Administered 2014-07-10 (×2): 1 mg via INTRAVENOUS
  Administered 2014-07-10 – 2014-07-11 (×2): 2 mg via INTRAVENOUS
  Administered 2014-07-11 (×2): 1 mg via INTRAVENOUS
  Administered 2014-07-11: 2 mg via INTRAVENOUS
  Administered 2014-07-12 – 2014-07-14 (×4): 1 mg via INTRAVENOUS
  Filled 2014-07-09 (×6): qty 1
  Filled 2014-07-09 (×2): qty 2
  Filled 2014-07-09 (×3): qty 1
  Filled 2014-07-09 (×4): qty 2
  Filled 2014-07-09 (×2): qty 1
  Filled 2014-07-09 (×2): qty 2
  Filled 2014-07-09: qty 1
  Filled 2014-07-09: qty 2

## 2014-07-09 MED ORDER — HYDROMORPHONE HCL 1 MG/ML IJ SOLN
0.2500 mg | INTRAMUSCULAR | Status: DC | PRN
  Administered 2014-07-09 (×5): 0.5 mg via INTRAVENOUS

## 2014-07-09 MED ORDER — ONDANSETRON HCL 4 MG/2ML IJ SOLN: 4.0000 mg | Freq: Once | INTRAMUSCULAR | Status: DC | PRN

## 2014-07-09 SURGICAL SUPPLY — 57 items
APPLICATOR COTTON TIP 6IN STRL (MISCELLANEOUS) ×2 IMPLANT
BLADE EXTENDED COATED 6.5IN (ELECTRODE) ×1 IMPLANT
BLADE HEX COATED 2.75 (ELECTRODE) ×2 IMPLANT
BLADE SURG SZ10 CARB STEEL (BLADE) ×1 IMPLANT
COVER MAYO STAND STRL (DRAPES) ×3 IMPLANT
DRAPE INCISE IOBAN 66X45 STRL (DRAPES) ×1 IMPLANT
DRAPE LAPAROSCOPIC ABDOMINAL (DRAPES) ×2 IMPLANT
DRAPE SHEET LG 3/4 BI-LAMINATE (DRAPES) ×2 IMPLANT
DRAPE UTILITY XL STRL (DRAPES) ×4 IMPLANT
DRAPE WARM FLUID 44X44 (DRAPE) ×2 IMPLANT
DRSG OPSITE POSTOP 4X10 (GAUZE/BANDAGES/DRESSINGS) IMPLANT
DRSG OPSITE POSTOP 4X6 (GAUZE/BANDAGES/DRESSINGS) IMPLANT
DRSG OPSITE POSTOP 4X8 (GAUZE/BANDAGES/DRESSINGS) IMPLANT
DRSG TELFA 3X8 NADH (GAUZE/BANDAGES/DRESSINGS) ×2 IMPLANT
ELECT REM PT RETURN 9FT ADLT (ELECTROSURGICAL) ×2
ELECTRODE REM PT RTRN 9FT ADLT (ELECTROSURGICAL) ×1 IMPLANT
GAUZE SPONGE 4X4 12PLY STRL (GAUZE/BANDAGES/DRESSINGS) ×2 IMPLANT
GLOVE BIOGEL PI IND STRL 7.5 (GLOVE) ×2 IMPLANT
GLOVE BIOGEL PI INDICATOR 7.5 (GLOVE) ×2
GLOVE ECLIPSE 7.5 STRL STRAW (GLOVE) ×4 IMPLANT
GOWN STRL REUS W/TWL XL LVL3 (GOWN DISPOSABLE) ×12 IMPLANT
HOLDER FOLEY CATH W/STRAP (MISCELLANEOUS) ×1 IMPLANT
KIT BASIN OR (CUSTOM PROCEDURE TRAY) ×2 IMPLANT
LEGGING LITHOTOMY PAIR STRL (DRAPES) ×2 IMPLANT
LIGASURE IMPACT 36 18CM CVD LR (INSTRUMENTS) IMPLANT
NS IRRIG 1000ML POUR BTL (IV SOLUTION) ×4 IMPLANT
PACK GENERAL/GYN (CUSTOM PROCEDURE TRAY) ×2 IMPLANT
PAD DRESSING TELFA 3X8 NADH (GAUZE/BANDAGES/DRESSINGS) IMPLANT
PENCIL BUTTON HOLSTER BLD 10FT (ELECTRODE) ×2 IMPLANT
SPONGE LAP 18X18 X RAY DECT (DISPOSABLE) ×1 IMPLANT
STAPLER CIRC ILS CVD 25MM (STAPLE) ×1 IMPLANT
STAPLER VISISTAT 35W (STAPLE) ×2 IMPLANT
SUCTION POOLE TIP (SUCTIONS) ×2 IMPLANT
SUT NOV 1 T60/GS (SUTURE) IMPLANT
SUT NOVA 1 T20/GS 25DT (SUTURE) IMPLANT
SUT NOVA NAB DX-16 0-1 5-0 T12 (SUTURE) IMPLANT
SUT NOVA T20/GS 25 (SUTURE) IMPLANT
SUT PDS AB 1 CT1 27 (SUTURE) ×3 IMPLANT
SUT PDS AB 1 CTX 36 (SUTURE) IMPLANT
SUT PDS AB 1 TP1 96 (SUTURE) ×2 IMPLANT
SUT PROLENE 2 0 KS (SUTURE) ×1 IMPLANT
SUT SILK 2 0 (SUTURE) ×2
SUT SILK 2 0 SH CR/8 (SUTURE) ×2 IMPLANT
SUT SILK 2 0SH CR/8 30 (SUTURE) IMPLANT
SUT SILK 2-0 18XBRD TIE 12 (SUTURE) ×1 IMPLANT
SUT SILK 2-0 30XBRD TIE 12 (SUTURE) IMPLANT
SUT SILK 3 0 (SUTURE) ×2
SUT SILK 3 0 SH CR/8 (SUTURE) ×2 IMPLANT
SUT SILK 3-0 18XBRD TIE 12 (SUTURE) ×2 IMPLANT
SUT VIC AB 3-0 54XBRD REEL (SUTURE) IMPLANT
SUT VIC AB 3-0 BRD 54 (SUTURE)
TAPE CLOTH SURG 6X10 WHT LF (GAUZE/BANDAGES/DRESSINGS) ×1 IMPLANT
TOWEL OR 17X26 10 PK STRL BLUE (TOWEL DISPOSABLE) ×3 IMPLANT
TOWEL OR NON WOVEN STRL DISP B (DISPOSABLE) ×3 IMPLANT
TRAY FOLEY CATH 14FRSI W/METER (CATHETERS) ×1 IMPLANT
TRAY FOLEY CATH 16FRSI W/METER (SET/KITS/TRAYS/PACK) ×1 IMPLANT
YANKAUER SUCT BULB TIP 10FT TU (MISCELLANEOUS) ×2 IMPLANT

## 2014-07-09 NOTE — Anesthesia Postprocedure Evaluation (Signed)
  Anesthesia Post-op Note  Patient: Randy Park  Procedure(s) Performed: Procedure(s) (LRB): OPEN TAKEDOWN HARTMAN COLOSTOMY  (N/A)  Patient Location: PACU  Anesthesia Type: General  Level of Consciousness: awake and alert   Airway and Oxygen Therapy: Patient Spontanous Breathing  Post-op Pain: mild  Post-op Assessment: Post-op Vital signs reviewed, Patient's Cardiovascular Status Stable, Respiratory Function Stable, Patent Airway and No signs of Nausea or vomiting  Last Vitals:  Filed Vitals:   07/09/14 1845  BP: 164/73  Pulse: 73  Temp: 36.5 C  Resp:     Post-op Vital Signs: stable   Complications: No apparent anesthesia complications

## 2014-07-09 NOTE — Anesthesia Procedure Notes (Signed)
Procedure Name: Intubation Date/Time: 07/09/2014 2:36 PM Performed by: Delphia GratesHANDLER, Yukio Bisping Pre-anesthesia Checklist: Emergency Drugs available, Suction available, Patient being monitored and Patient identified Patient Re-evaluated:Patient Re-evaluated prior to inductionOxygen Delivery Method: Circle system utilized Preoxygenation: Pre-oxygenation with 100% oxygen Intubation Type: IV induction Laryngoscope Size: Mac and 4 Grade View: Grade III Tube type: Oral Tube size: 7.5 mm Number of attempts: 1 Airway Equipment and Method: Stylet Placement Confirmation: breath sounds checked- equal and bilateral,  ETT inserted through vocal cords under direct vision and positive ETCO2 Secured at: 22 cm Tube secured with: Tape Dental Injury: Teeth and Oropharynx as per pre-operative assessment  Difficulty Due To: Difficulty was anticipated and Difficult Airway- due to anterior larynx Comments: Needed cricoid to visualize cords. Previous documented Grade 3 view.

## 2014-07-09 NOTE — Anesthesia Preprocedure Evaluation (Signed)
Anesthesia Evaluation  Patient identified by MRN, date of birth, ID band Patient awake    Reviewed: Allergy & Precautions, NPO status , Patient's Chart, lab work & pertinent test results  Airway Mallampati: I  TM Distance: >3 FB Neck ROM: Full    Dental   Pulmonary Current Smoker,          Cardiovascular     Neuro/Psych    GI/Hepatic GERD-  ,  Endo/Other    Renal/GU      Musculoskeletal   Abdominal   Peds  Hematology   Anesthesia Other Findings   Reproductive/Obstetrics                             Anesthesia Physical Anesthesia Plan  ASA: II  Anesthesia Plan: General   Post-op Pain Management:    Induction: Intravenous  Airway Management Planned: Oral ETT  Additional Equipment:   Intra-op Plan:   Post-operative Plan: Extubation in OR  Informed Consent: I have reviewed the patients History and Physical, chart, labs and discussed the procedure including the risks, benefits and alternatives for the proposed anesthesia with the patient or authorized representative who has indicated his/her understanding and acceptance.     Plan Discussed with: CRNA and Surgeon  Anesthesia Plan Comments:         Anesthesia Quick Evaluation

## 2014-07-09 NOTE — Op Note (Signed)
Preoperative Diagnosis: colostomy status  Postoprative Diagnosis: colostomy status  Procedure: Procedure(s): OPEN TAKEDOWN HARTMAN COLOSTOMY    Surgeon: Glenna FellowsHoxworth, Solomiya Pascale T   Assistants: Luretha MurphyMatthew Martin  Anesthesia:  General endotracheal anesthesia  Indications: Patient is approximately 8 months following Hartmann colectomy and end colostomy For perforated diverticulitis. He has done well and desires colostomy takedown. We've previously discussed the procedure in detail including the nature and indications and risks documented elsewhere. He has undergone a mechanical and antibody bowel prep at home.    Procedure Detail: Patient was brought to the operating room, placed in the supine position on the operating table, and general endotracheal anesthesia induced. Foley catheter was placed. He was carefully positioned in a semi-lithotomy position. The ostomy was sewn closed with a pursestring 2-0 silk. The abdomen and perineum were widely sterilely prepped and draped. Patient timeout was performed and correct procedure verified. The previous midline scar was sharply excised and dissected carried down through the subcutaneous tissue and midline fascia and previous suture material removed. The abdomen was entered under direct vision. There were remarkably few adhesions. The ostomy site was exposed from within the abdomen and the colon dissected away from the peritoneum anterior abdominal wall. The ostomy site was then transversely elliptically excised and the dissection carried down through the subcutaneous tissue to the level of the fascia and the ostomy reduced completely into the abdomen. Some additional length was obtained on the left colon dividing lateral peritoneal attachments up toward the spleen. There was plenty of length to extend down into the pelvis. The colon appeared healthy. The rectal stump was exposed and it was carefully dissected with blunt and cautery dissection working down in the  pelvis for several centimeters until we came to normal soft rectosigmoid. The bowel at this point was cleaned of pericolic fat and mesentery and was divided with the TA 60 stapler. The pursestring stapler was placed across the left colon a few centimeters proximal to the colostomy site and healthy bowel after cleaning the area of mesentery and pericolic fat and a 2-0 Prolene pursestring suture placed. The bowel was divided and then the bowel was sized to a 25 mm stapler. The bowel diameter was small and both sides and clearly would not admit more than a 25 mm stapler. The anvil of the EEA stapler was placed in the end of the left colon and the pursestring suture secured. Dr. Daphine DeutscherMartin went below and advanced the 25 mm EEA stapler proximally to the staple line without difficulty and the spike was deployed to the midpoint of the staple line. The anvil was connected and the stapler was closed keeping any extraneous tissue out of the staple line. Stapler was fired and removed without difficulty. Rigid sigmoidoscopy was then performed by Dr. Daphine DeutscherMartin with the anastomosis tensely distended under saline irrigation there was no evidence of leak. The bowel appeared healthy and well perfused and was under no tension. Following this all gloves and instruments and gowns were changed. The abdomen was thoroughly irrigated. There was no bleeding or other problems. The abdominal wall defect at the colostomy site was closed in 2 layers with running #1 PDS. The midline wound was closed with looped running #1 PDS beginning. The incision and tied centrally. Subcutaneous tissue was irrigated and closed with staples. Sponge needle and instrument counts were correct.   Findings: As above  Estimated Blood Loss:  Minimal         Drains: none  Blood Given: none  Specimens: Colostomy and rectosigmoid stump        Complications:  * No complications entered in OR log *         Disposition: PACU - hemodynamically stable.          Condition: stable

## 2014-07-09 NOTE — Transfer of Care (Signed)
Immediate Anesthesia Transfer of Care Note  Patient: Randy Park  Procedure(s) Performed: Procedure(s): OPEN TAKEDOWN HARTMAN COLOSTOMY  (N/A)  Patient Location: PACU  Anesthesia Type:General  Level of Consciousness: awake, alert , oriented and patient cooperative  Airway & Oxygen Therapy: Patient Spontanous Breathing and Patient connected to face mask oxygen  Post-op Assessment: Report given to RN and Post -op Vital signs reviewed and stable  Post vital signs: Reviewed and stable  Last Vitals:  Filed Vitals:   07/09/14 1127  Pulse: 80  Temp: 36.2 C  Resp: 18    Complications: No apparent anesthesia complications

## 2014-07-09 NOTE — Interval H&P Note (Signed)
History and Physical Interval Note:  07/09/2014 1:24 PM  Randy Park  has presented today for surgery, with the diagnosis of colostomy stat  Hartmann colostomy  The various methods of treatment have been discussed with the patient and family. After consideration of risks, benefits and other options for treatment, the patient has consented to  Procedure(s): OPEN TAKEDOWN HARTMAN COLOSTOMY  (N/A) as a surgical intervention .  The patient's history has been reviewed, patient examined, no change in status, stable for surgery.  I have reviewed the patient's chart and labs.  Questions were answered to the patient's satisfaction.     [name]

## 2014-07-09 NOTE — H&P (Signed)
  History of Present Illness Randy Park(Hussam Muniz T. Deanette Tullius MD; 05/31/2014 2:51 PM) Patient words: f/u colostomy.  The patient is a 54 year old male presenting for a post-operative visit. He returns now approaching 7 months following emergency Hartmann colectomy for complicated perforated sigmoid diverticulitis. He is doing very well. Denies any abdominal complaints. Colostomy is functioning well. He has had no illnesses since I saw him last.   Other Problems Mariella Saa(Danilo Cappiello T Nannie Starzyk, MD; 05/31/2014 2:56 PM) POSTOP CHECK (V67.00  Z09) COLOSTOMY IN PLACE (V44.3  Z93.3) Gastroesophageal Reflux Disease Diverticulosis  Past Surgical History Mariella Saa(Kemarion Abbey T Jeanean Hollett, MD; 05/31/2014 2:58 PM) Resection of Stomach Colectomy with colostomy  Diagnostic Studies History Mariella Saa(Mahesh Sizemore T Stefanny Pieri, MD; 05/31/2014 2:56 PM) Colonoscopy 5-10 years ago  Allergies Michel Bickers(Kelly Dockery, LPN; 1/61/09602/26/2016 4:542:16 PM) Penicillins  Medication History Mariella Saa(Niley Helbig T Nil Bolser, MD; 05/31/2014 2:56 PM) Multivitamins/Minerals (Oral) Active. Medications Reconciled Neomycin Sulfate (500MG  Tablet, 2 (two) Tablet Oral SEE NOTE, Taken starting 05/31/2014) Active. (TAKE TWO TABLETS AT 2 PM, 3 PM, AND 10 PM THE DAY PRIOR TO SURGERY) Flagyl (500MG  Tablet, 2 (two) Tablet Oral SEE NOTE, Taken starting 05/31/2014) Active. (Take at 2pm, 3pm, and 10pm the day prior to your colon operation)  Social History Mariella Saa(Bailyn Spackman T Ryelynn Guedea, MD; 05/31/2014 2:56 PM) No drug use Caffeine use Coffee, Tea. Alcohol use Recently quit alcohol use. Tobacco use Former smoker.  Family History Mariella Saa(Sherrol Vicars T Kayshaun Polanco, MD; 05/31/2014 2:56 PM) Breast Cancer Sister. Migraine Headache Mother. Thyroid problems Sister. Respiratory Condition Father, Mother.  Vitals Tresa Endo(Kelly Dockery LPN; 0/98/11912/26/2016 4:782:17 PM) 05/31/2014 2:17 PM Weight: 178.38 lb Height: 71in Body Surface Area: 2.01 m Body Mass Index: 24.88 kg/m Temp.: 48F  Pulse: 92 (Regular)  BP: 120/74  (Sitting, Left Arm, Standard)    Physical Exam Randy Park(Vermell Madrid T. Andres Bantz MD; 05/31/2014 2:52 PM) The physical exam findings are as follows: Note:General: Alert, well-developed and well nourished African-American male, in no distress Skin: Warm and dry without rash or infection. HEENT: No palpable masses or thyromegaly. Sclera nonicteric. Lymph nodes: No cervical, supraclavicular, or inguinal nodes palpable. Lungs: Breath sounds clear and equal. No wheezing or increased work of breathing. Cardiovascular: Regular rate and rhythm without murmer. No JVD or edema. Peripheral pulses intact. No carotid bruits. Abdomen: Nondistended. Soft and nontender. No masses palpable. No organomegaly. Well-healed low midline incision without hernias. Colostomy present left lower quadrant. Extremities: No edema or joint swelling or deformity. No chronic venous stasis changes. Neurologic: Alert and fully oriented. Gait normal. No focal weakness. Psychiatric: Normal mood and affect. Thought content appropriate with normal judgement and insight    Assessment & Plan Randy Park(Rafiel Mecca T. Laylamarie Meuser MD; 05/31/2014 2:55 PM) COLOSTOMY IN PLACE (V44.3  Z93.3) Impression: Status post Hartmann colectomy for perforated diverticulitis. Doing well at this point. Ready to proceed with colostomy reversal. We discussed the indications and nature of the procedure. We discussed expected hospitalization and recovery as well as risks of bleeding, infection, anesthetic complications, medication reactions, possible leakage or intra-abdominal infection requiring repeat ostomy. He understands and agrees to proceed. He is given a prescription for mechanical and antibiotic bowel prep. I'm going to obtain a barium study through his ostomy and rectum prior to the procedure. Current Plans  Schedule for Surgery Takedown of Hartmann colostomy under general anesthesia

## 2014-07-10 ENCOUNTER — Encounter (HOSPITAL_COMMUNITY): Payer: Self-pay | Admitting: General Surgery

## 2014-07-10 LAB — BASIC METABOLIC PANEL
Anion gap: 6 (ref 5–15)
BUN: 12 mg/dL (ref 6–23)
CALCIUM: 8.4 mg/dL (ref 8.4–10.5)
CHLORIDE: 105 mmol/L (ref 96–112)
CO2: 24 mmol/L (ref 19–32)
Creatinine, Ser: 1.22 mg/dL (ref 0.50–1.35)
GFR calc Af Amer: 78 mL/min — ABNORMAL LOW (ref >90)
GFR, EST NON AFRICAN AMERICAN: 67 mL/min — AB (ref >90)
GLUCOSE: 142 mg/dL — AB (ref 70–99)
Potassium: 5.2 mmol/L — ABNORMAL HIGH (ref 3.5–5.1)
SODIUM: 135 mmol/L (ref 135–145)

## 2014-07-10 LAB — CBC
HCT: 42.4 % (ref 39.0–52.0)
Hemoglobin: 13.9 g/dL (ref 13.0–17.0)
MCH: 31.7 pg (ref 26.0–34.0)
MCHC: 32.8 g/dL (ref 30.0–36.0)
MCV: 96.8 fL (ref 78.0–100.0)
Platelets: 293 10*3/uL (ref 150–400)
RBC: 4.38 MIL/uL (ref 4.22–5.81)
RDW: 12.8 % (ref 11.5–15.5)
WBC: 13 10*3/uL — AB (ref 4.0–10.5)

## 2014-07-10 MED ORDER — SODIUM CHLORIDE 0.9 % IV SOLN
INTRAVENOUS | Status: DC
  Administered 2014-07-10: 09:00:00 via INTRAVENOUS
  Administered 2014-07-12: 1000 mL via INTRAVENOUS
  Administered 2014-07-13 – 2014-07-14 (×3): via INTRAVENOUS

## 2014-07-10 NOTE — Progress Notes (Signed)
Patient ID: Randy Park, male   DOB: 1961-04-04, 54 y.o.   MRN: [id number] 1 Day Post-Op  Subjective: Incision is very sore insensitive but pain medications working okay. No nausea or other complaints.  Objective: Vital signs in last 24 hours: Temp:  [97.2 F (36.2 C)-98.9 F (37.2 C)] 98.2 F (36.8 C) (04/06 0600) Pulse Rate:  [62-98] 69 (04/06 0600) Resp:  [10-18] 18 (04/06 0600) BP: (139-182)/(73-93) 150/75 mmHg (04/06 0600) SpO2:  [94 %-100 %] 98 % (04/06 0600) Weight:  [81.647 kg (180 lb)] 81.647 kg (180 lb) (04/05 1127)    Intake/Output from previous day: 04/05 0701 - 04/06 0700 In: 3741.7 [I.V.:3641.7; IV Piggyback:100] Out: 1400 [Urine:1200; Blood:200] Intake/Output this shift:    General appearance: alert, cooperative and no distress GI: appropriate incisional tenderness. Nondistended. Incision/Wound: dressing clean and dry  Lab Results:   Recent Labs  07/08/14 0955 07/10/14 0450  WBC 7.5 13.0*  HGB 15.5 13.9  HCT 46.0 42.4  PLT 282 293   BMET  Recent Labs  07/08/14 0955 07/10/14 0450  NA 139 135  K 5.1 5.2*  CL 104 105  CO2 28 24  GLUCOSE 104* 142*  BUN 19 12  CREATININE 1.34 1.22  CALCIUM 9.4 8.4     Studies/Results: No results found.  Anti-infectives: Anti-infectives    Start     Dose/Rate Route Frequency Ordered Stop   07/09/14 1415  metroNIDAZOLE (FLAGYL) IVPB 500 mg  Status:  Discontinued     500 mg 100 mL/hr over 60 Minutes Intravenous  Once 07/09/14 1414 07/09/14 1838   07/09/14 1330  metroNIDAZOLE (FLAGYL) IVPB 500 mg  Status:  Discontinued     500 mg 100 mL/hr over 60 Minutes Intravenous Every 8 hours 07/09/14 1319 07/09/14 1414   07/09/14 1136  ciprofloxacin (CIPRO) IVPB 400 mg     400 mg 200 mL/hr over 60 Minutes Intravenous On call to O.R. 07/09/14 1136 07/09/14 1452      Assessment/Plan: s/p Procedure(s): OPEN TAKEDOWN HARTMAN COLOSTOMY  Stable postoperatively. No nausea, we'll begin clear liquid  diet. Reduce IV fluids and discontinue KCl. Lab in a.m.    LOS: 1 day    Saiya Crist T 07/10/2014

## 2014-07-11 LAB — BASIC METABOLIC PANEL
ANION GAP: 9 (ref 5–15)
BUN: 11 mg/dL (ref 6–23)
CO2: 25 mmol/L (ref 19–32)
CREATININE: 1.16 mg/dL (ref 0.50–1.35)
Calcium: 8.6 mg/dL (ref 8.4–10.5)
Chloride: 101 mmol/L (ref 96–112)
GFR, EST AFRICAN AMERICAN: 83 mL/min — AB (ref >90)
GFR, EST NON AFRICAN AMERICAN: 71 mL/min — AB (ref >90)
Glucose, Bld: 94 mg/dL (ref 70–99)
Potassium: 4 mmol/L (ref 3.5–5.1)
Sodium: 135 mmol/L (ref 135–145)

## 2014-07-11 LAB — CBC
HCT: 40.8 % (ref 39.0–52.0)
Hemoglobin: 13.4 g/dL (ref 13.0–17.0)
MCH: 31.7 pg (ref 26.0–34.0)
MCHC: 32.8 g/dL (ref 30.0–36.0)
MCV: 96.5 fL (ref 78.0–100.0)
PLATELETS: 279 10*3/uL (ref 150–400)
RBC: 4.23 MIL/uL (ref 4.22–5.81)
RDW: 13 % (ref 11.5–15.5)
WBC: 9.5 10*3/uL (ref 4.0–10.5)

## 2014-07-11 NOTE — Progress Notes (Signed)
Patient ID: Randy Park, male   DOB: 08-Nov-1960, 54 y.o.   MRN: [id number] 2 Days Post-Op  Subjective: No major C/O.  Pain meds working. Just sipped on CL but no N/V.Marland Kitchen.Voiding OK with Foley out  Objective: Vital signs in last 24 hours: Temp:  [98.1 F (36.7 C)-98.8 F (37.1 C)] 98.8 F (37.1 C) (04/07 0650) Pulse Rate:  [60-88] 78 (04/07 0650) Resp:  [18] 18 (04/07 0650) BP: (133-146)/(67-73) 146/67 mmHg (04/07 0650) SpO2:  [92 %-97 %] 97 % (04/07 0650)    Intake/Output from previous day: 04/06 0701 - 04/07 0700 In: 1703.8 [P.O.:120; I.V.:1583.8] Out: 1725 [Urine:1725] Intake/Output this shift:    General appearance: alert, cooperative and no distress GI: normal findings: soft, non-tender and minimal distention  Incision/Wound: Some fresh bllod and clot upper incision without active bleeding or signs of infection  Lab Results:   Recent Labs  07/10/14 0450 07/11/14 0425  WBC 13.0* 9.5  HGB 13.9 13.4  HCT 42.4 40.8  PLT 293 279   BMET  Recent Labs  07/10/14 0450 07/11/14 0425  NA 135 135  K 5.2* 4.0  CL 105 101  CO2 24 25  GLUCOSE 142* 94  BUN 12 11  CREATININE 1.22 1.16  CALCIUM 8.4 8.6     Studies/Results: No results found.  Anti-infectives: Anti-infectives    Start     Dose/Rate Route Frequency Ordered Stop   07/09/14 1415  metroNIDAZOLE (FLAGYL) IVPB 500 mg  Status:  Discontinued     500 mg 100 mL/hr over 60 Minutes Intravenous  Once 07/09/14 1414 07/09/14 1838   07/09/14 1330  metroNIDAZOLE (FLAGYL) IVPB 500 mg  Status:  Discontinued     500 mg 100 mL/hr over 60 Minutes Intravenous Every 8 hours 07/09/14 1319 07/09/14 1414   07/09/14 1136  ciprofloxacin (CIPRO) IVPB 400 mg     400 mg 200 mL/hr over 60 Minutes Intravenous On call to O.R. 07/09/14 1136 07/09/14 1452      Assessment/Plan: s/p Procedure(s): OPEN TAKEDOWN HARTMAN COLOSTOMY  Stable.  Cont CL today.  Ambulation encouraged Hold heparin due to bleeding from wound   LOS:  2 days    Randy Park 07/11/2014

## 2014-07-12 NOTE — Progress Notes (Signed)
Upon arrival to shift, pt stated "I just threw up blood". Blood tinged mucous in emesis basin. Writer explained to pt that this wasn't considered "throwing up blood" and educated pt about irritation to the back of the throat post-surgery. Pt accepted teaching. Will continue to monitor.

## 2014-07-12 NOTE — Progress Notes (Signed)
Patient ID: Randy Park, male   DOB: 11-11-1960, 54 y.o.   MRN: [id number] 3 Days Post-Op  Subjective: C/O gas cramps, pain, needs to pass gas but can't.  Some nausea without vomiting  Objective: Vital signs in last 24 hours: Temp:  [98.6 F (37 C)-99.3 F (37.4 C)] 98.8 F (37.1 C) (04/08 0602) Pulse Rate:  [72-92] 92 (04/08 0602) Resp:  [18] 18 (04/08 0602) BP: (149-151)/(80-87) 150/86 mmHg (04/08 0602) SpO2:  [96 %] 96 % (04/08 0602)    Intake/Output from previous day: 04/07 0701 - 04/08 0700 In: 2640 [P.O.:840; I.V.:1800] Out: 2950 [Urine:2950] Intake/Output this shift:    General appearance: alert, cooperative and no distress GI: abnormal findings:  mild tenderness in the entire abdomen Incision/Wound: Minor bleeeding upper incision looks old, no signs of infection  Lab Results:   Recent Labs  07/10/14 0450 07/11/14 0425  WBC 13.0* 9.5  HGB 13.9 13.4  HCT 42.4 40.8  PLT 293 279   BMET  Recent Labs  07/10/14 0450 07/11/14 0425  NA 135 135  K 5.2* 4.0  CL 105 101  CO2 24 25  GLUCOSE 142* 94  BUN 12 11  CREATININE 1.22 1.16  CALCIUM 8.4 8.6     Studies/Results: No results found.  Anti-infectives: Anti-infectives    Start     Dose/Rate Route Frequency Ordered Stop   07/09/14 1415  metroNIDAZOLE (FLAGYL) IVPB 500 mg  Status:  Discontinued     500 mg 100 mL/hr over 60 Minutes Intravenous  Once 07/09/14 1414 07/09/14 1838   07/09/14 1330  metroNIDAZOLE (FLAGYL) IVPB 500 mg  Status:  Discontinued     500 mg 100 mL/hr over 60 Minutes Intravenous Every 8 hours 07/09/14 1319 07/09/14 1414   07/09/14 1136  ciprofloxacin (CIPRO) IVPB 400 mg     400 mg 200 mL/hr over 60 Minutes Intravenous On call to O.R. 07/09/14 1136 07/09/14 1452      Assessment/Plan: s/p Procedure(s): OPEN TAKEDOWN HARTMAN COLOSTOMY  Stable.  Likely expected ileus.  Continue CL only.  Pt ambulating well.  Check lab in AM Heparin held due to oozing from incision   LOS: 3 days    Ramsie Ostrander T 07/12/2014

## 2014-07-13 LAB — BASIC METABOLIC PANEL
Anion gap: 9 (ref 5–15)
BUN: 13 mg/dL (ref 6–23)
CALCIUM: 8.6 mg/dL (ref 8.4–10.5)
CO2: 22 mmol/L (ref 19–32)
Chloride: 104 mmol/L (ref 96–112)
Creatinine, Ser: 1.06 mg/dL (ref 0.50–1.35)
GFR calc Af Amer: >90 mL/min (ref >90)
GFR, EST NON AFRICAN AMERICAN: 80 mL/min — AB (ref >90)
GLUCOSE: 101 mg/dL — AB (ref 70–99)
Potassium: 3.8 mmol/L (ref 3.5–5.1)
Sodium: 135 mmol/L (ref 135–145)

## 2014-07-13 LAB — CBC
HEMATOCRIT: 38.7 % — AB (ref 39.0–52.0)
HEMOGLOBIN: 13.3 g/dL (ref 13.0–17.0)
MCH: 32.3 pg (ref 26.0–34.0)
MCHC: 34.4 g/dL (ref 30.0–36.0)
MCV: 93.9 fL (ref 78.0–100.0)
Platelets: 240 10*3/uL (ref 150–400)
RBC: 4.12 MIL/uL — AB (ref 4.22–5.81)
RDW: 12.5 % (ref 11.5–15.5)
WBC: 6.6 10*3/uL (ref 4.0–10.5)

## 2014-07-13 MED ORDER — HEPARIN SODIUM (PORCINE) 5000 UNIT/ML IJ SOLN
5000.0000 [IU] | Freq: Three times a day (TID) | INTRAMUSCULAR | Status: DC
  Administered 2014-07-13 – 2014-07-15 (×6): 5000 [IU] via SUBCUTANEOUS
  Filled 2014-07-13 (×9): qty 1

## 2014-07-13 NOTE — Progress Notes (Signed)
4 Days Post-Op  Subjective: Pt doing well.  Pt with abd gas cramping.  Ambulating well  Objective: Vital signs in last 24 hours: Temp:  [97.6 F (36.4 C)-98.6 F (37 C)] 98.6 F (37 C) (04/09 0557) Pulse Rate:  [65-75] 75 (04/09 0557) Resp:  [18] 18 (04/09 0557) BP: (137-167)/(86-91) 167/86 mmHg (04/09 0557) SpO2:  [96 %-99 %] 97 % (04/09 0557) Last BM Date: 07/12/14  Intake/Output from previous day: 04/08 0701 - 04/09 0700 In: 2520 [P.O.:720; I.V.:1800] Out: 1100 [Urine:1100] Intake/Output this shift:    General appearance: alert and cooperative GI: soft, approp ttp, ND, wound c/d/i, active BS  Lab Results:   Recent Labs  07/11/14 0425 07/13/14 0510  WBC 9.5 6.6  HGB 13.4 13.3  HCT 40.8 38.7*  PLT 279 240   BMET  Recent Labs  07/11/14 0425 07/13/14 0510  NA 135 135  K 4.0 3.8  CL 101 104  CO2 25 22  GLUCOSE 94 101*  BUN 11 13  CREATININE 1.16 1.06  CALCIUM 8.6 8.6    Anti-infectives: Anti-infectives    Start     Dose/Rate Route Frequency Ordered Stop   07/09/14 1415  metroNIDAZOLE (FLAGYL) IVPB 500 mg  Status:  Discontinued     500 mg 100 mL/hr over 60 Minutes Intravenous  Once 07/09/14 1414 07/09/14 1838   07/09/14 1330  metroNIDAZOLE (FLAGYL) IVPB 500 mg  Status:  Discontinued     500 mg 100 mL/hr over 60 Minutes Intravenous Every 8 hours 07/09/14 1319 07/09/14 1414   07/09/14 1136  ciprofloxacin (CIPRO) IVPB 400 mg     400 mg 200 mL/hr over 60 Minutes Intravenous On call to O.R. 07/09/14 1136 07/09/14 1452      Assessment/Plan: s/p Procedure(s): OPEN TAKEDOWN HARTMAN COLOSTOMY  (N/A) Ileus- Lytes normal, await bowel function Mobilize Wound c/d/i restart DVT prophylaxis   LOS: 4 days    Marigene Ehlersamirez Jr., Jed LimerickArmando 07/13/2014

## 2014-07-14 NOTE — Progress Notes (Signed)
Pharmacy Brief Note - Alvimopan (Entereg)   The standing order set for alvimopan (Entereg) now includes an automatic order to discontinue the drug after the patient has had a bowel movement. The change was approved by the Pharmacy & Therapeutics Committee and the Medical Executive Committee.   This patient has had a bowel movement documented by nursing. Therefore, alvimopan has been discontinued. If there are questions, please contact the pharmacy at [phone].  Thank you   Bernadene Personrew Conley Delisle, PharmD 07/14/2014, 2:12 PM

## 2014-07-14 NOTE — Progress Notes (Signed)
5 Days Post-Op  Subjective: Pt doing well.  +BMs Pain controlled Ambulating well  Objective: Vital signs in last 24 hours: Temp:  [98.1 F (36.7 C)-98.5 F (36.9 C)] 98.1 F (36.7 C) (04/10 0443) Pulse Rate:  [65-77] 71 (04/10 0443) Resp:  [16-18] 16 (04/10 0443) BP: (127-152)/(76-79) 149/79 mmHg (04/10 0443) SpO2:  [95 %-99 %] 95 % (04/10 0443) Last BM Date: 07/13/14  Intake/Output from previous day: 04/09 0701 - 04/10 0700 In: 2040 [P.O.:240; I.V.:1800] Out: -  Intake/Output this shift:    General appearance: alert and cooperative GI: soft, non-tender; bowel sounds normal; no masses,  no organomegaly and wound c/d/i  Lab Results:   Recent Labs  07/13/14 0510  WBC 6.6  HGB 13.3  HCT 38.7*  PLT 240   BMET  Recent Labs  07/13/14 0510  NA 135  K 3.8  CL 104  CO2 22  GLUCOSE 101*  BUN 13  CREATININE 1.06  CALCIUM 8.6   Assessment/Plan: s/p Procedure(s): OPEN TAKEDOWN HARTMAN COLOSTOMY  (N/A) Advance diet  Hopefully home in AM if tol PO and con't to do well  LOS: 5 days    Marigene Ehlersamirez Jr., [facility] 07/14/2014

## 2014-07-15 MED ORDER — OXYCODONE-ACETAMINOPHEN 5-325 MG PO TABS: 1.0000 | ORAL_TABLET | ORAL | Status: DC | PRN

## 2014-07-15 NOTE — Progress Notes (Signed)
07/15/14 0845  Reviewed discharge instructions with patient. Patient verbalized understanding of discharge orders. Copy of prescription and discharge instructions given to patient.

## 2014-07-15 NOTE — Progress Notes (Signed)
Patient ID: Randy Park, male   DOB: 1960/12/04, 54 y.o.   MRN: [id number] 6 Days Post-Op  Subjective: Doing well this morning. Mild pain controlled with oral medications. Tolerating diet without nausea. Has had bowel movements.  Objective: Vital signs in last 24 hours: Temp:  [98.1 F (36.7 C)-98.8 F (37.1 C)] 98.3 F (36.8 C) (04/11 0558) Pulse Rate:  [57-66] 66 (04/11 0558) Resp:  [16] 16 (04/11 0558) BP: (139-147)/(74-76) 139/74 mmHg (04/11 0558) SpO2:  [97 %-99 %] 97 % (04/11 0558) Last BM Date: 07/13/14  Intake/Output from previous day: 04/10 0701 - 04/11 0700 In: 2667.5 [P.O.:960; I.V.:1707.5] Out: -  Intake/Output this shift:    General appearance: alert, cooperative and no distress GI: normal findings: soft, non-tender Incision/Wound: clean and dry without evidence of infection  Lab Results:   Recent Labs  07/13/14 0510  WBC 6.6  HGB 13.3  HCT 38.7*  PLT 240   BMET  Recent Labs  07/13/14 0510  NA 135  K 3.8  CL 104  CO2 22  GLUCOSE 101*  BUN 13  CREATININE 1.06  CALCIUM 8.6     Studies/Results: No results found.  Anti-infectives: Anti-infectives    Start     Dose/Rate Route Frequency Ordered Stop   07/09/14 1415  metroNIDAZOLE (FLAGYL) IVPB 500 mg  Status:  Discontinued     500 mg 100 mL/hr over 60 Minutes Intravenous  Once 07/09/14 1414 07/09/14 1838   07/09/14 1330  metroNIDAZOLE (FLAGYL) IVPB 500 mg  Status:  Discontinued     500 mg 100 mL/hr over 60 Minutes Intravenous Every 8 hours 07/09/14 1319 07/09/14 1414   07/09/14 1136  ciprofloxacin (CIPRO) IVPB 400 mg     400 mg 200 mL/hr over 60 Minutes Intravenous On call to O.R. 07/09/14 1136 07/09/14 1452      Assessment/Plan: s/p Procedure(s): OPEN TAKEDOWN HARTMAN COLOSTOMY  Doing well. Okay for discharge today.   LOS: 6 days    Krishang Reading T 07/15/2014

## 2014-07-15 NOTE — Progress Notes (Signed)
07/15/14 0825  Staples removed, benzoin applied, and steri strips applied

## 2014-07-15 NOTE — Discharge Instructions (Signed)
[facility]      [facility] [phone]  OPEN ABDOMINAL SURGERY: POST OP INSTRUCTIONS  Always review your discharge instruction sheet given to you by the facility where your surgery was performed.  IF YOU HAVE DISABILITY OR FAMILY LEAVE FORMS, YOU MUST BRING THEM TO THE OFFICE FOR PROCESSING.  PLEASE DO NOT GIVE THEM TO YOUR DOCTOR.  1. A prescription for pain medication may be given to you upon discharge.  Take your pain medication as prescribed, if needed.  If narcotic pain medicine is not needed, then you may take acetaminophen (Tylenol) or ibuprofen (Advil) as needed. 2. Take your usually prescribed medications unless otherwise directed. 3. If you need a refill on your pain medication, please contact your pharmacy. They will contact our office to request authorization.  Prescriptions will not be filled after 5pm or on week-ends. 4. You should follow a light diet the first few days after arrival home, such as soup and crackers, pudding, etc.unless your doctor has advised otherwise. A high-fiber, low fat diet can be resumed as tolerated.   Be sure to include lots of fluids daily. Most patients will experience some swelling and bruising on the chest and neck area.  Ice packs will help.  Swelling and bruising can take several days to resolve 5. Most patients will experience some swelling and bruising in the area of the incision. Ice pack will help. Swelling and bruising can take several days to resolve..  6. It is common to experience some constipation if taking pain medication after surgery.  Increasing fluid intake and taking a stool softener will usually help or prevent this problem from occurring.  A mild laxative (Milk of Magnesia or Miralax) should be taken according to package directions if there are no bowel movements after 48 hours. 7.  You may have steri-strips (small skin tapes) in place directly over the incision.  These strips should be left on the skin for 7-10 days.  If your  surgeon used skin glue on the incision, you may shower in 24 hours.  The glue will flake off over the next 2-3 weeks.  Any sutures or staples will be removed at the office during your follow-up visit. You may find that a light gauze bandage over your incision may keep your staples from being rubbed or pulled. You may shower and replace the bandage daily. 8. ACTIVITIES:  You may resume regular (light) daily activities beginning the next day--such as daily self-care, walking, climbing stairs--gradually increasing activities as tolerated.  You may have sexual intercourse when it is comfortable.  Refrain from any heavy lifting or straining until approved by your doctor. a. You may drive when you no longer are taking prescription pain medication, you can comfortably wear a seatbelt, and you can safely maneuver your car and apply brakes b. Return to Work: ___________________________________ 9. You should see your doctor in the office for a follow-up appointment approximately two weeks after your surgery.  Make sure that you call for this appointment within a day or two after you arrive home to insure a convenient appointment time. OTHER INSTRUCTIONS:  _____________________________________________________________ _____________________________________________________________  WHEN TO CALL YOUR DOCTOR: 1. Fever over 101.0 2. Inability to urinate 3. Nausea and/or vomiting 4. Extreme swelling or bruising 5. Continued bleeding from incision. 6. Increased pain, redness, or drainage from the incision. 7. Difficulty swallowing or breathing 8. Muscle cramping or spasms. 9. Numbness or tingling in hands or feet or around lips.  The clinic staff is available to   answer your questions during regular business hours.  Please don't hesitate to call and ask to speak to one of the nurses if you have concerns.  For further questions, please visit [email or web address]   

## 2014-07-15 NOTE — Discharge Summary (Signed)
   Patient ID: Randy Park [id number] 53 y.o. 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 He has done well and desires colostomy takedown.  Hospital Course: Following a mechanical and antibody bowel prep at home the patient was admitted on the morning of surgery. He underwent an uneventful open takedown of his Gertie GowdaHartmann colostomy with a stapled anastomosis. His postoperative course was uncomplicated. He had some expected ileus for several days but then began passing flatus. Pain gradually subsided. Diet was advanced. He began having bowel movements. On the day of discharge she is afebrile with normal vital signs. His wound is clean without evidence of infection and staples are removed.   Disposition: Home  Patient Instructions:    Medication List    STOP taking these medications        acetaminophen 500 MG tablet  Commonly known as:  TYLENOL     HYDROcodone-acetaminophen 5-325 MG per tablet  Commonly known as:  NORCO/VICODIN      TAKE these medications        multivitamin with minerals Tabs tablet  Take 1 tablet by mouth daily.     mupirocin cream 2 %  Commonly known as:  BACTROBAN  Apply 1 application topically 2 (two) times daily.     oxyCODONE-acetaminophen 5-325 MG per tablet  Commonly known as:  PERCOCET/ROXICET  Take 1-2 tablets by mouth every 4 (four) hours as needed for moderate pain.        Activity: no lifting, driving, or strenuous exercise for 6 weeks Diet: regular diet Wound Care: none needed  Follow-up:  With Dr.  Johna SheriffHoxworth in 3 weeks.  Signed: Mariella SaaBenjamin T Damyra Luscher MD, FACS  07/15/2014, 7:46 AM

## 2015-07-09 IMAGING — CR DG ABDOMEN 1V
2 series · 2 of 2 positions shown · non-contrast
Comparison: Radiographs of same day.

CLINICAL DATA: Possible pneumoperitoneum.

EXAM:
ABDOMEN - 1 VIEW

[w abdomen decub]
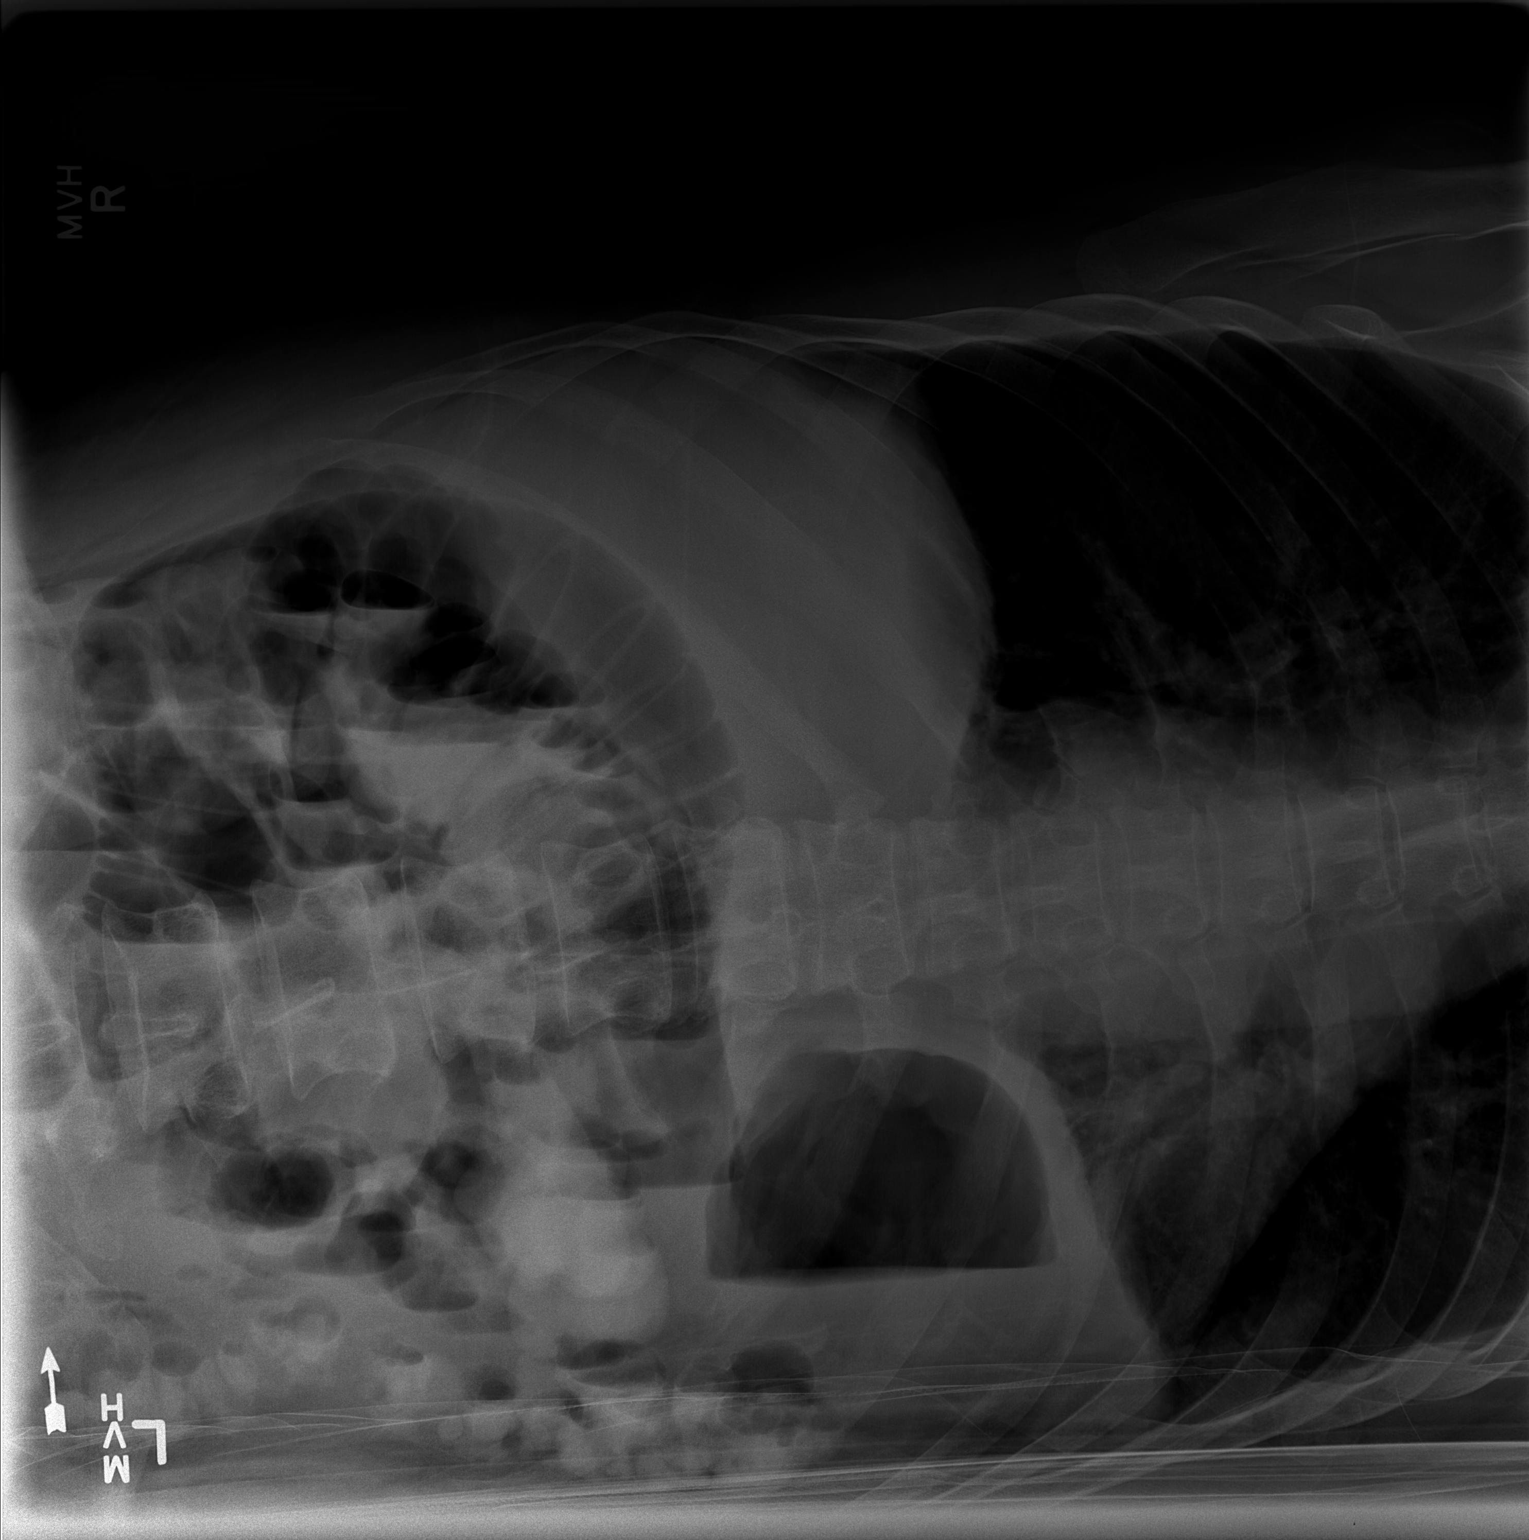

[w abdomen decub *]
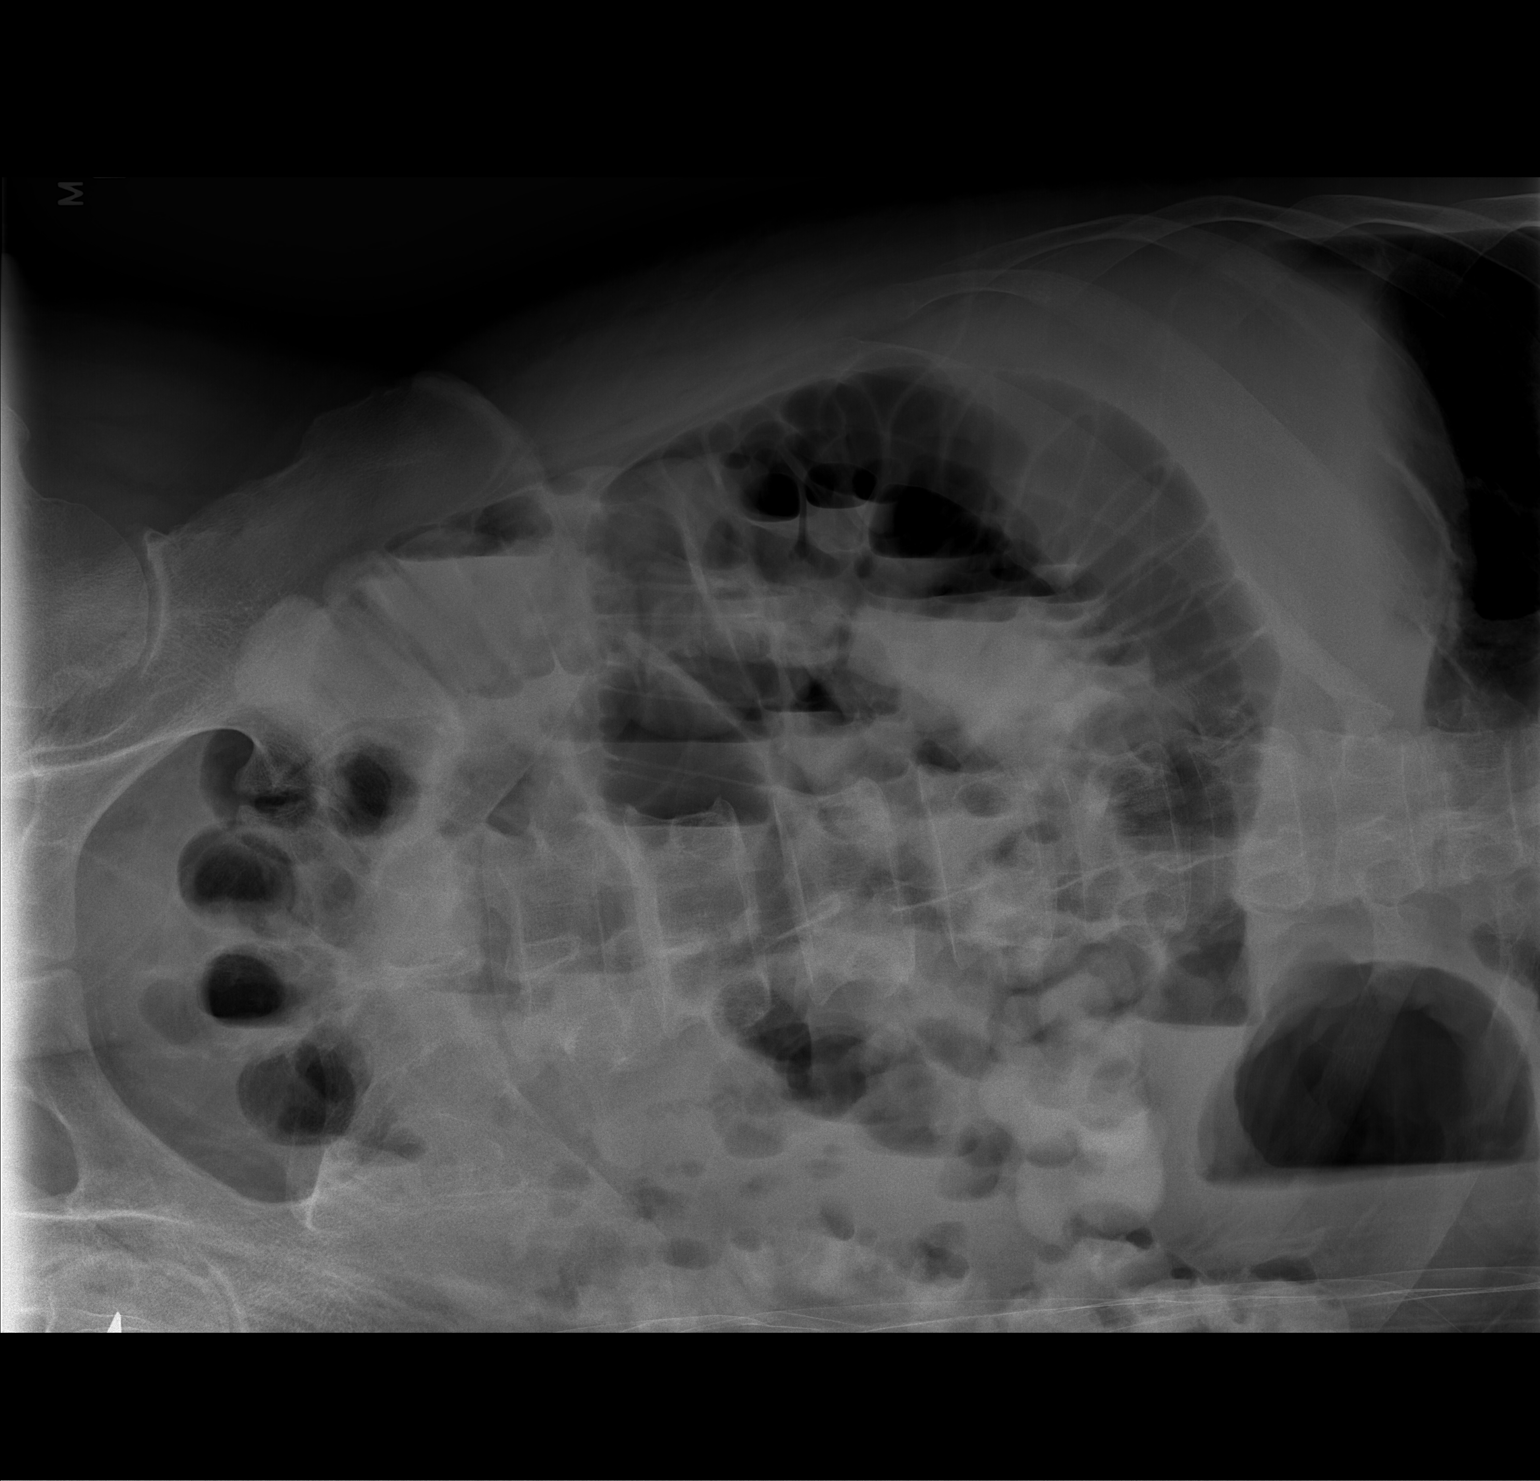

[2 of 2 positions shown; findings below may reference images not displayed]

FINDINGS: Left lateral decubitus view of the abdomen does not demonstrate
definite evidence of pneumoperitoneum. Continued small bowel
dilatation is noted concerning for distal small bowel obstruction.
IMPRESSION: No definite evidence of pneumoperitoneum is noted on left lateral
decubitus view.

## 2015-07-09 IMAGING — CR DG ABDOMEN 2V
3 series · 3 of 3 positions shown · non-contrast
Comparison: CT scan of September 29, 2013; radiograph July 04, 2006.

CLINICAL DATA: Abdominal pain and distension.

EXAM:
ABDOMEN - 2 VIEW

[w abdomen upright *]
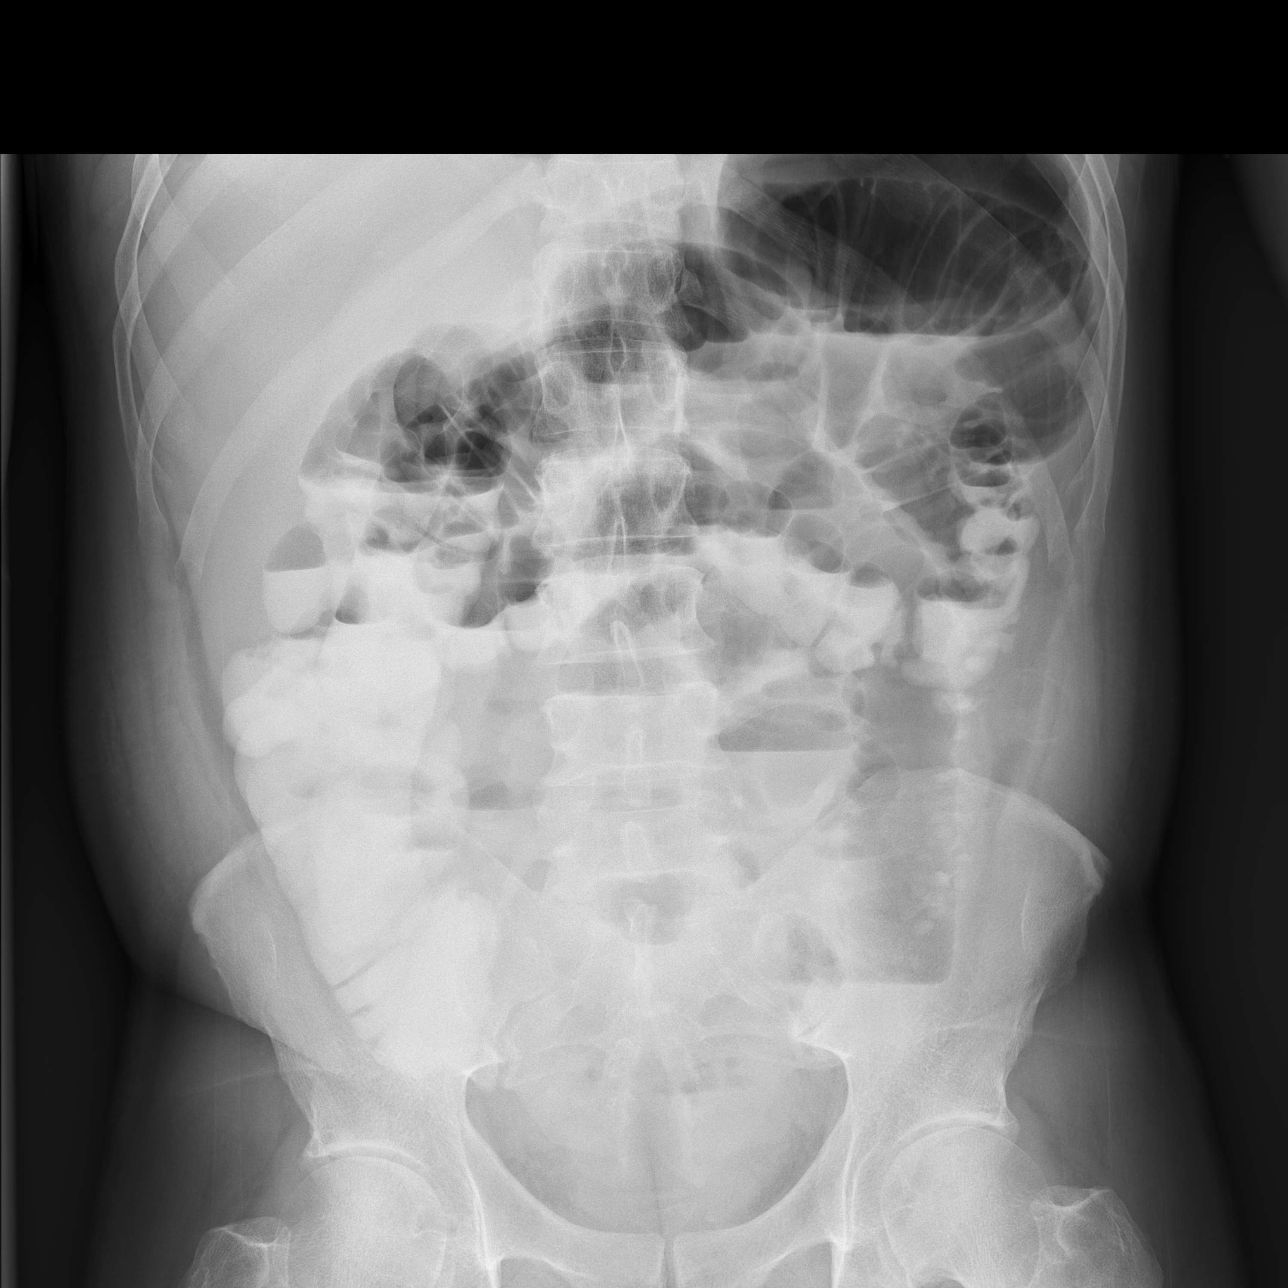

[t abdomen supine (1 of 2)]
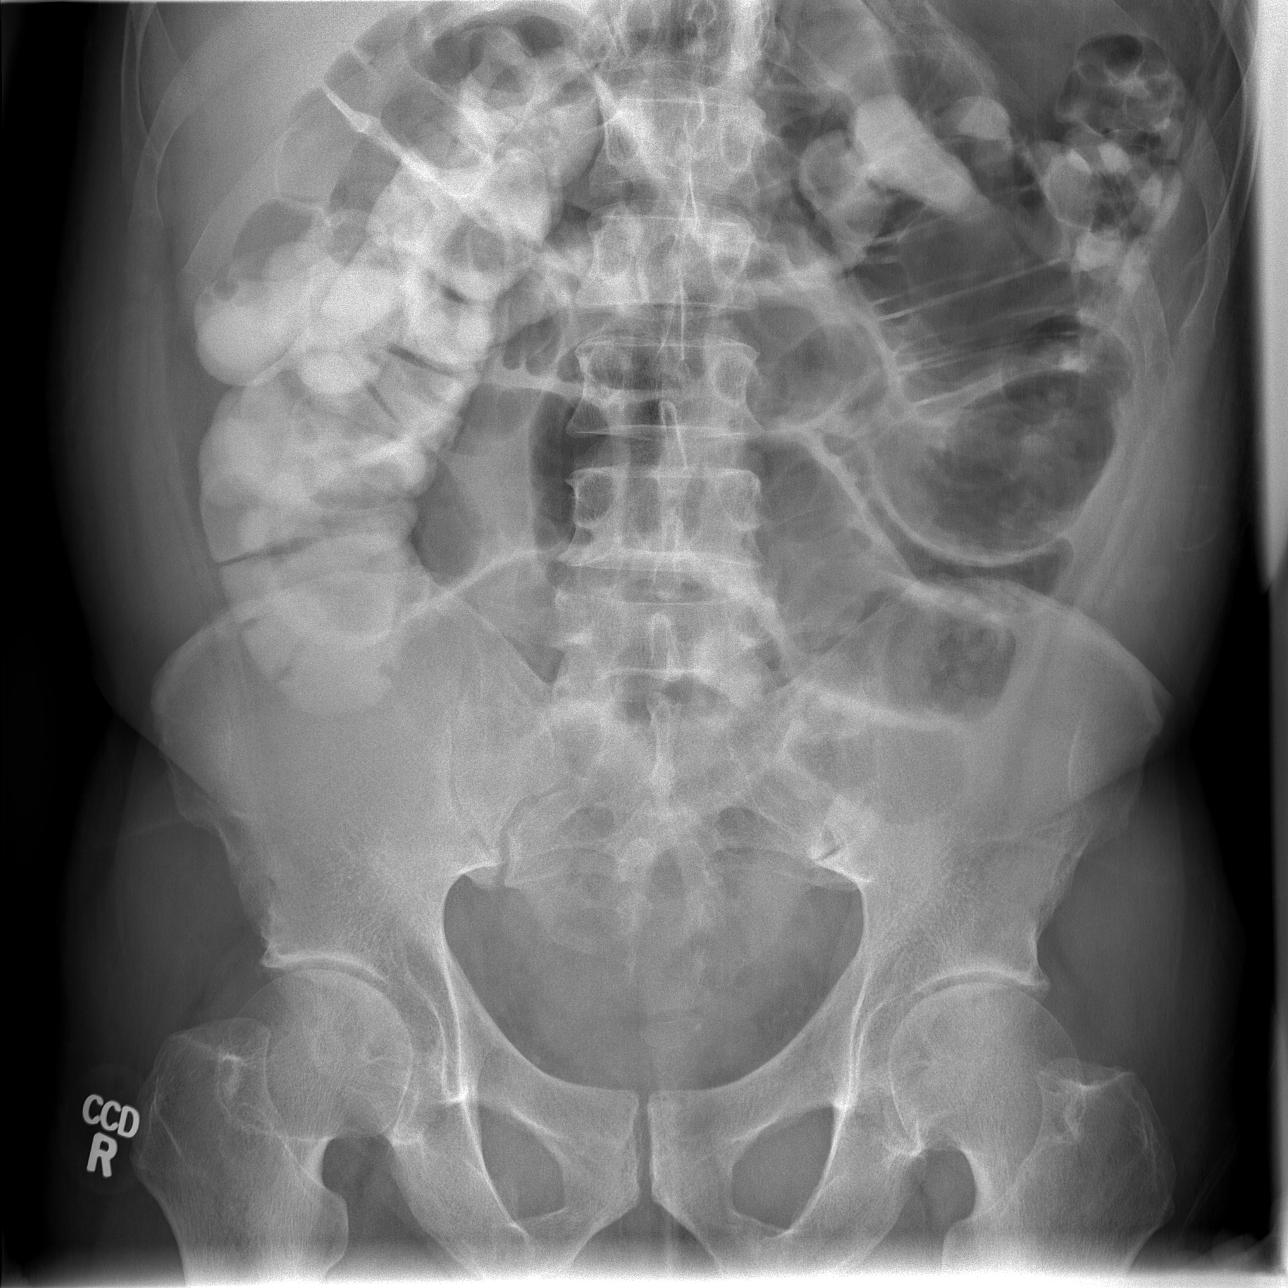

[t abdomen supine (2 of 2)]
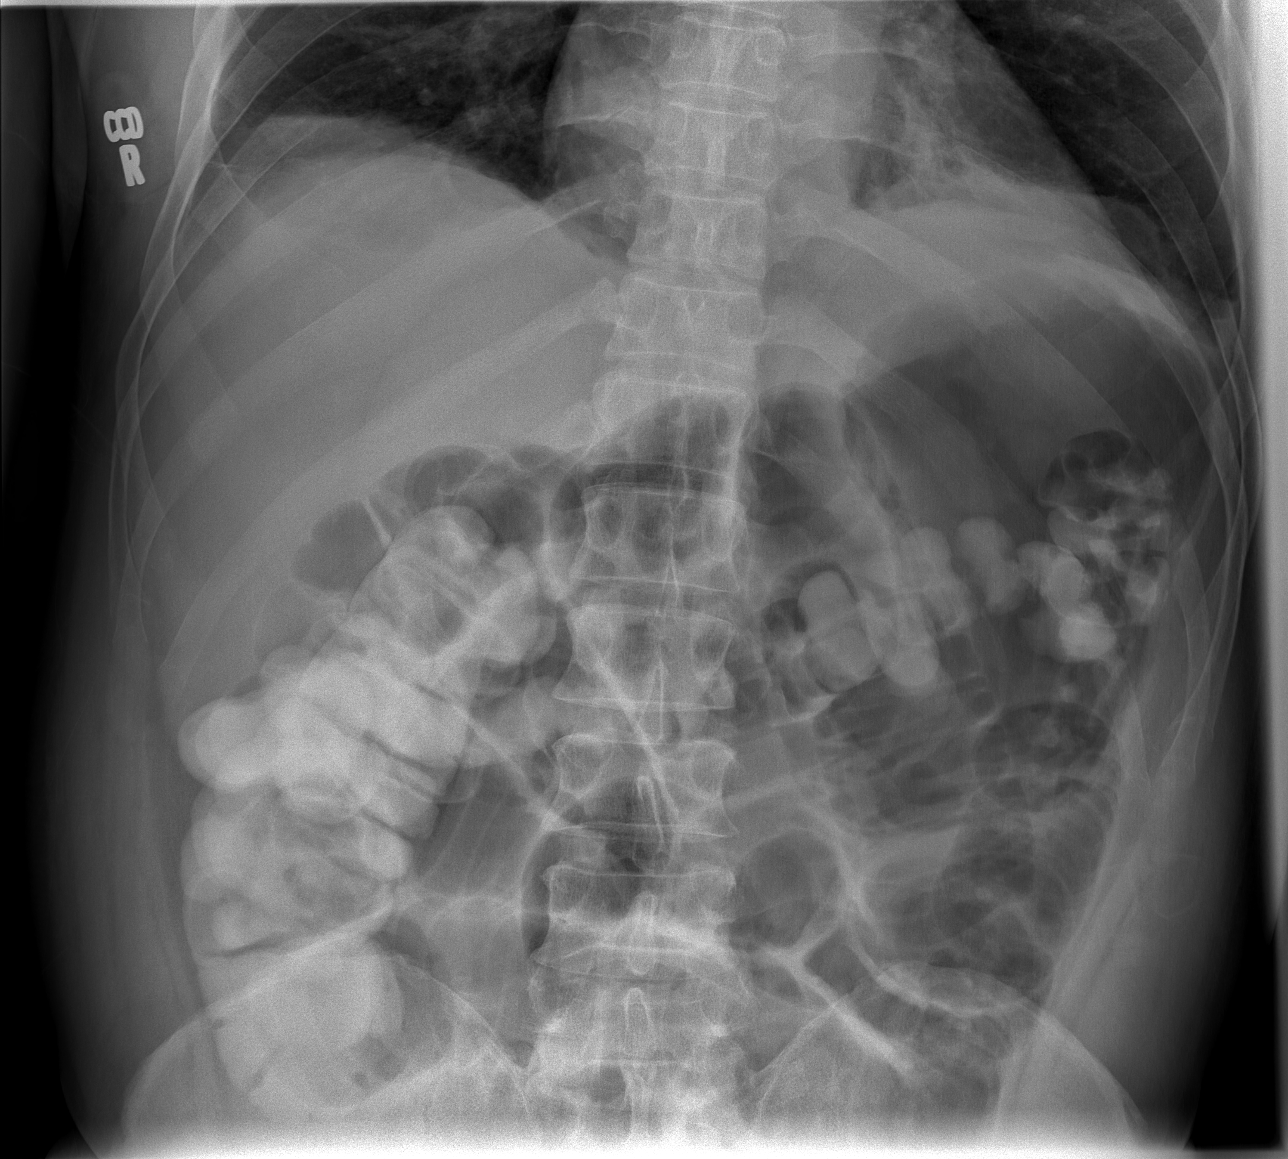

[3 of 3 positions shown; findings below may reference images not displayed]

FINDINGS: Dilated small bowel loops are noted with air-fluid levels consistent
with distal small bowel obstruction. No colonic dilatation is noted.
Residual contrast is noted within the colon. Possible Quirijn sign
is seen the left upper quadrant, which which suggest that the
contained perforation described on prior CT scan has progressed to
larger pneumoperitoneum. Left lateral decubitus view of the abdomen
may be performed for further evaluation.
IMPRESSION: Dilated small bowel loops are noted consistent with distal small
bowel obstruction. Possible pneumoperitoneum is noted ; left lateral
decubitus view of the abdomen is recommended for further evaluation.
Critical Value/emergent results were called by telephone at the time
of interpretation on 10/03/2013 at [DATE] to Dr. METALO KEMP ,
who verbally acknowledged these results.

## 2015-07-09 IMAGING — CT CT ABD-PELV W/ CM
2 of 5 series · 16 of 46 positions shown, 18 images · IV contrast (omnipaque)
Comparison: 09/29/2013

CLINICAL DATA: Diverticulitis, continued abdominal pain, increasing
abdominal distension

EXAM:
CT ABDOMEN AND PELVIS WITH CONTRAST
TECHNIQUE: Multidetector CT imaging of the abdomen and pelvis was performed
using the standard protocol following bolus administration of
intravenous contrast.
CONTRAST:  100mL OMNIPAQUE IOHEXOL 300 MG/ML  SOLN

[Series 2: rtn a/p with · axial · 0.82mm/px · z∈[-448,-58]mm · 13 of 88 slices shown, 15 images]
[im 5/88  soft-tissue]
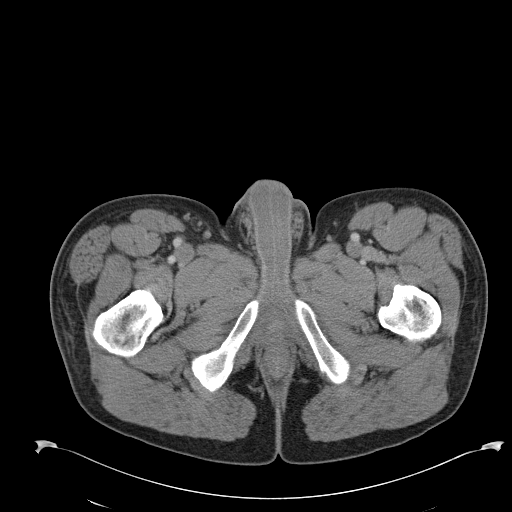
[im 5/88  bone]
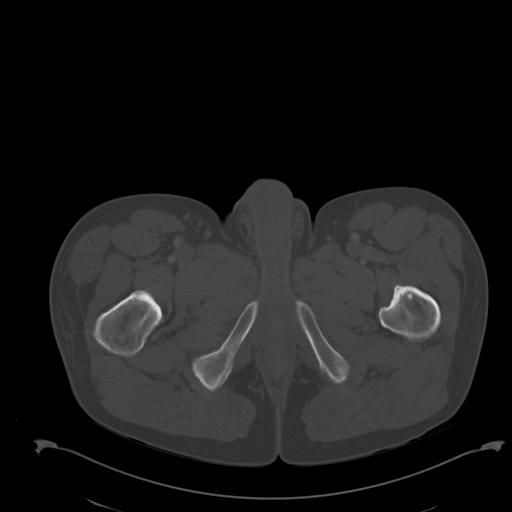
[im 14/88  soft-tissue]
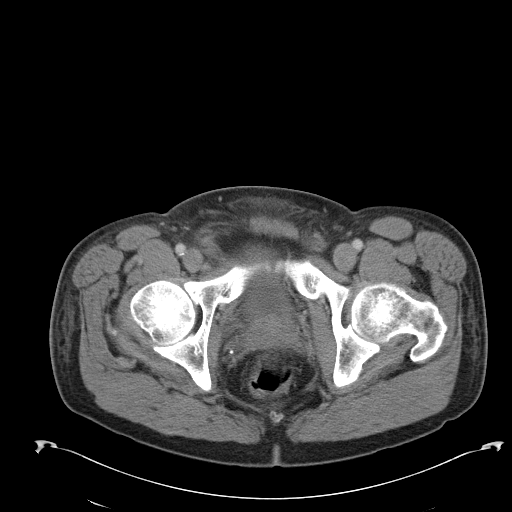
[im 19/88  soft-tissue]
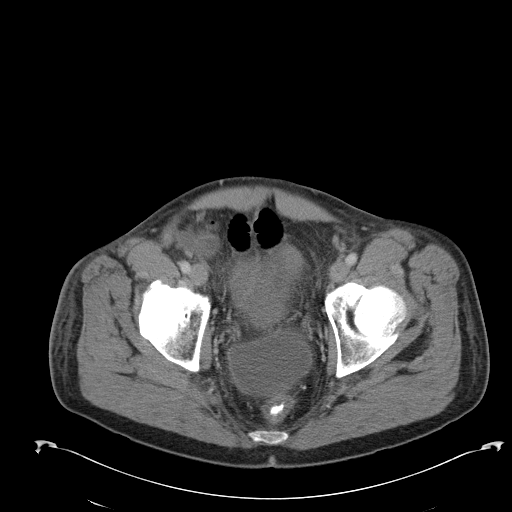
[im 23/88  soft-tissue]
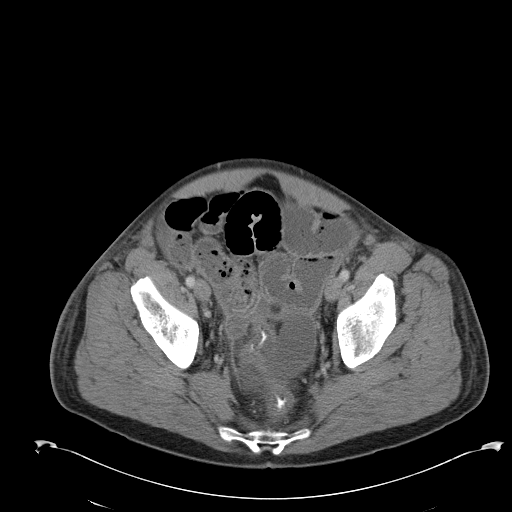
[im 33/88  soft-tissue]
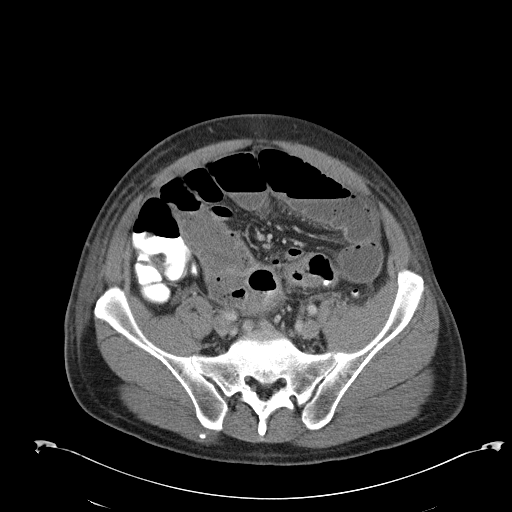
[im 37/88  soft-tissue]
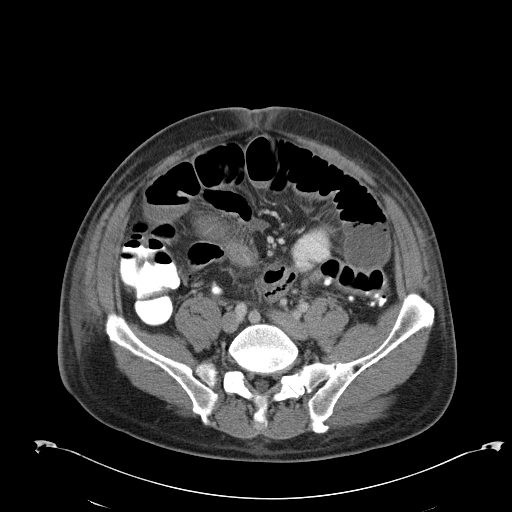
[im 46/88  soft-tissue]
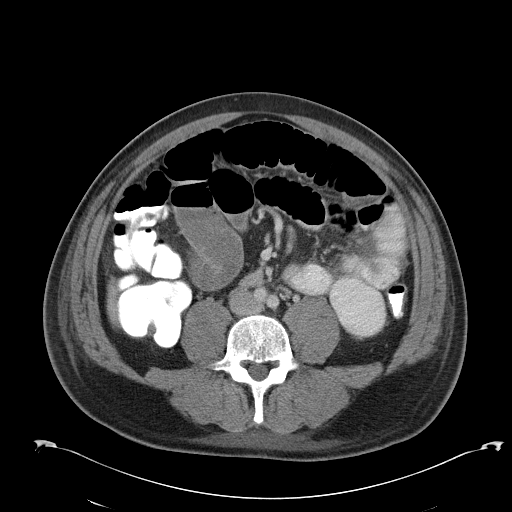
[im 51/88  soft-tissue]
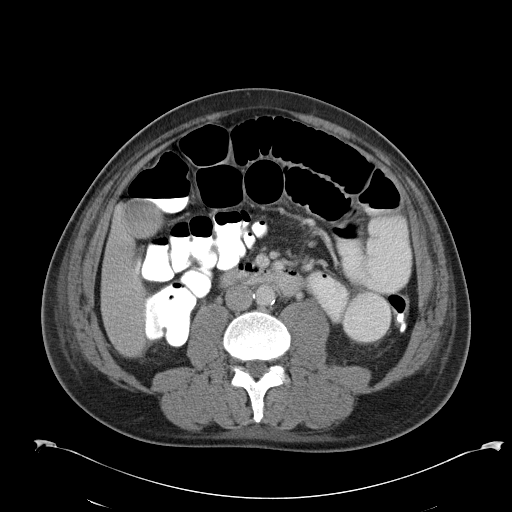
[im 55/88  soft-tissue]
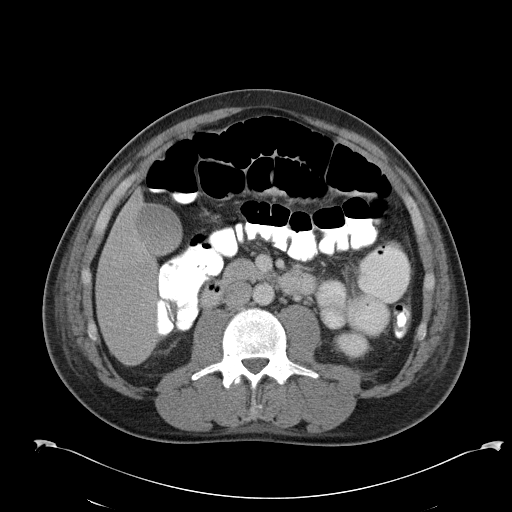
[im 55/88  bone]
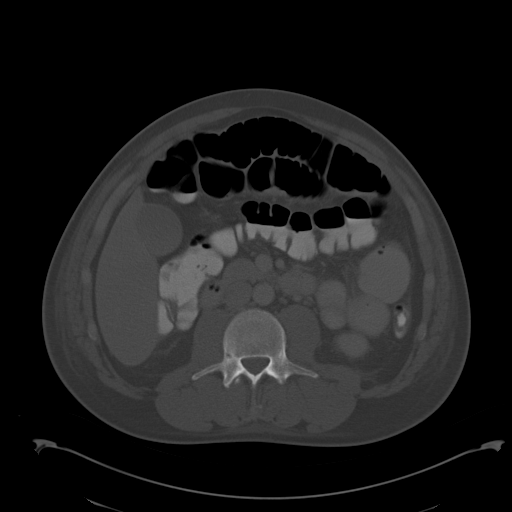
[im 65/88  soft-tissue]
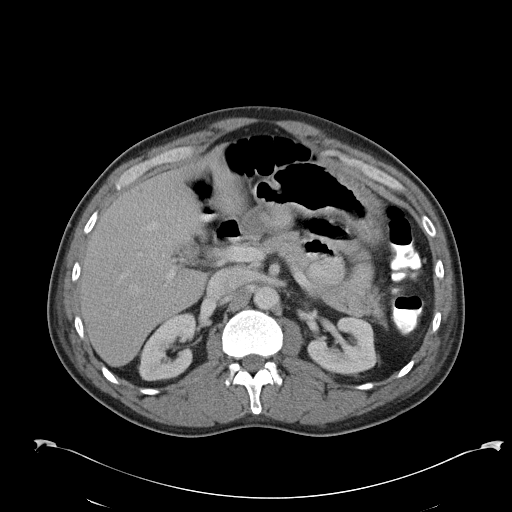
[im 69/88  soft-tissue]
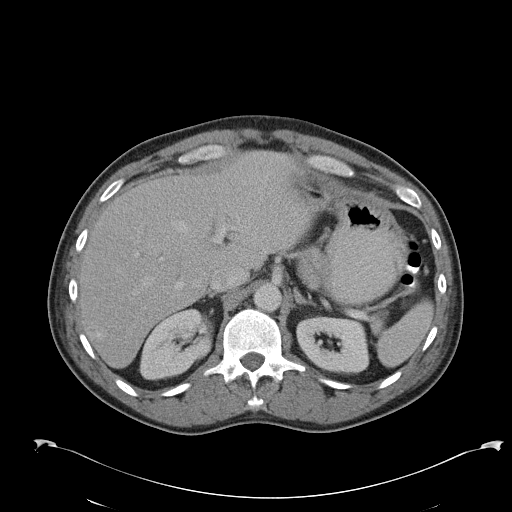
[im 74/88  soft-tissue]
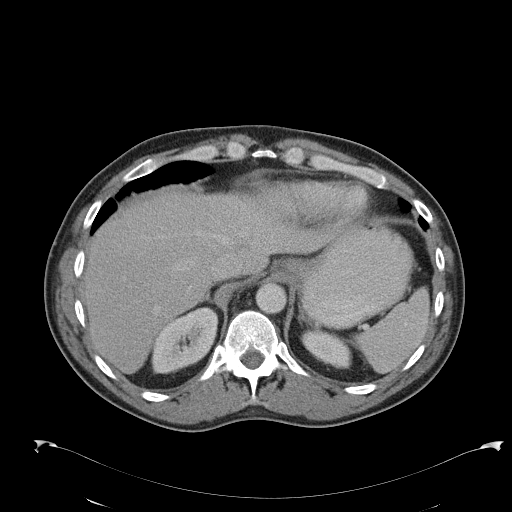
[im 83/88  soft-tissue]
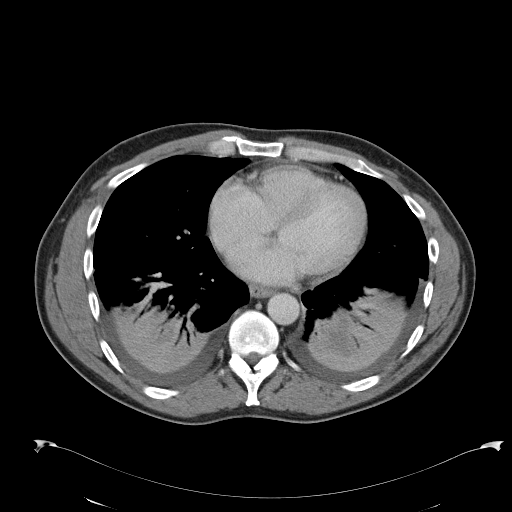

[Series 602: <mpr thick range> · coronal · 0.86mm/px · 3 of 131 slices shown]
[im 44/131  soft-tissue]
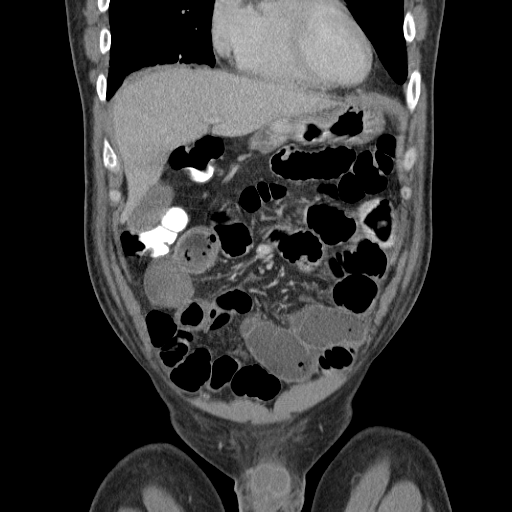
[im 58/131  soft-tissue]
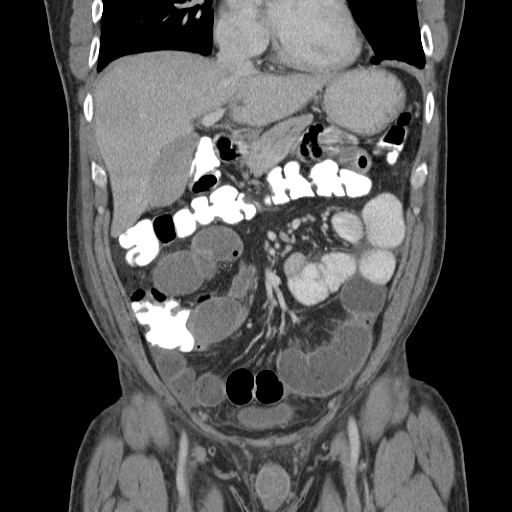
[im 73/131  soft-tissue]
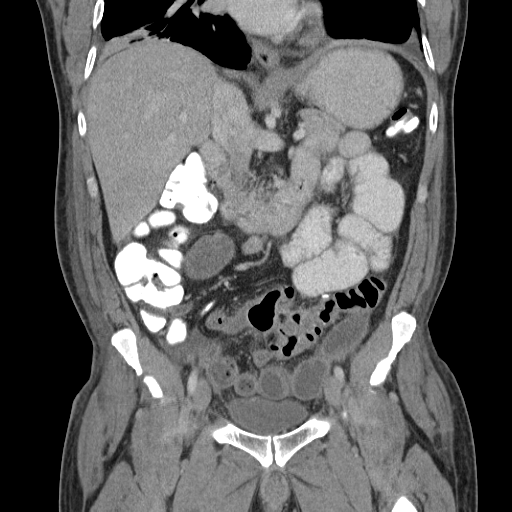

[16 of 46 positions shown; findings below may reference images not displayed]

FINDINGS: Small bilateral pleural effusions, progressed. Associated lower lobe
opacities, likely compressive atelectasis.

2.2 cm possible hemangioma in the posterior segment right hepatic
lobe (series 2/ image 21), although incompletely .

Spleen, pancreas, and adrenal glands are within normal limits.

Gallbladder is grossly unremarkable. No intrahepatic or extrahepatic
ductal dilatation.

Kidneys are within normal limits.  No hydronephrosis.

Multiple mildly prominent loops of small and large bowel in the
central abdomen, likely reflecting adynamic ileus.

Sigmoid diverticulitis with adjacent 2.0 x 2.6 cm thin-walled gas
collection along the sigmoid mesocolon (series 2/ image 55),
reflecting localized perforation. Adjacent scattered foci of
mesenteric gas, some of which are difficult to separate from
adjacent loops of decompressed bowel, but appear irregular.
Additional scattered tiny foci of nondependent free gas,
new/progressed.

Associated rim enhancing 5.5 x 6.9 cm fluid collection in the pelvis
(series 2/ image 69), reflecting a developing abscess.

Atherosclerotic calcifications of the abdominal aorta and branch
vessels.

No suspicious abdominopelvic lymphadenopathy.

Prostate is unremarkable.

Bladder is mildly thick-walled but underdistended.

Visualized osseous structures are within normal limits.
IMPRESSION: Sigmoid diverticulitis with localized perforation and tiny scattered
foci of free air, progressed from the prior study.

6.9 cm developing pelvic abscess, increased.

These results were called by telephone at the time of interpretation
on 10/03/2013 at [DATE] to Jihadul Islam Ema, the nurse caring for the
patient, who verbally acknowledged these results.

## 2016-11-19 ENCOUNTER — Encounter (HOSPITAL_COMMUNITY): Payer: Self-pay | Admitting: *Deleted

## 2016-11-19 ENCOUNTER — Emergency Department (HOSPITAL_COMMUNITY)
Admission: EM | Admit: 2016-11-19 | Discharge: 2016-11-19 | Disposition: A | Discharge status: Home / Self Care | Payer: PRIVATE HEALTH INSURANCE | Attending: Emergency Medicine | Admitting: Emergency Medicine

## 2016-11-19 DIAGNOSIS — F17210 Nicotine dependence, cigarettes, uncomplicated: Secondary | ICD-10-CM | POA: Diagnosis not present

## 2016-11-19 DIAGNOSIS — R21 Rash and other nonspecific skin eruption: Secondary | ICD-10-CM

## 2016-11-19 MED ORDER — HYDROXYZINE PAMOATE 100 MG PO CAPS: 100.0000 mg | ORAL_CAPSULE | Freq: Three times a day (TID) | ORAL | 0 refills | Status: DC | PRN

## 2016-11-19 MED ORDER — TRIAMCINOLONE ACETONIDE 0.1 % EX CREA: 1.0000 "application " | TOPICAL_CREAM | Freq: Two times a day (BID) | CUTANEOUS | 0 refills | Status: DC

## 2016-11-19 NOTE — Discharge Instructions (Signed)
Follow-up with your PCP as needed. Apply cream to rash twice a day as needed. Take vistaril as needed for itching.

## 2016-11-19 NOTE — ED Triage Notes (Signed)
Patient is alert and oriented x4.  He is being seen for an intermittent rash that has been on going for 3 weeks.  Patient states that the rash is mainly on his torso and goes down to his penis.  Currently he denies any pain but states that the rash is a 10 of 10 itching.
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
## 2017-04-28 ENCOUNTER — Emergency Department (HOSPITAL_COMMUNITY)
Admission: EM | Admit: 2017-04-28 | Discharge: 2017-04-28 | Disposition: A | Discharge status: Home / Self Care | Payer: No Typology Code available for payment source | Attending: Emergency Medicine | Admitting: Emergency Medicine

## 2017-04-28 ENCOUNTER — Emergency Department (HOSPITAL_COMMUNITY): Payer: No Typology Code available for payment source

## 2017-04-28 ENCOUNTER — Encounter (HOSPITAL_COMMUNITY): Payer: Self-pay | Admitting: Emergency Medicine

## 2017-04-28 ENCOUNTER — Other Ambulatory Visit: Payer: Self-pay

## 2017-04-28 DIAGNOSIS — R05 Cough: Secondary | ICD-10-CM | POA: Diagnosis present

## 2017-04-28 DIAGNOSIS — R51 Headache: Secondary | ICD-10-CM | POA: Diagnosis not present

## 2017-04-28 DIAGNOSIS — M7918 Myalgia, other site: Secondary | ICD-10-CM | POA: Diagnosis not present

## 2017-04-28 DIAGNOSIS — R6889 Other general symptoms and signs: Secondary | ICD-10-CM

## 2017-04-28 DIAGNOSIS — J111 Influenza due to unidentified influenza virus with other respiratory manifestations: Secondary | ICD-10-CM | POA: Insufficient documentation

## 2017-04-28 MED ORDER — BENZONATATE 100 MG PO CAPS: 100.0000 mg | ORAL_CAPSULE | Freq: Three times a day (TID) | ORAL | 0 refills | Status: DC

## 2017-04-28 MED ORDER — IBUPROFEN 800 MG PO TABS
800.0000 mg | ORAL_TABLET | Freq: Once | ORAL | Status: AC
  Administered 2017-04-28: 800 mg via ORAL
  Filled 2017-04-28: qty 1

## 2017-04-28 MED ORDER — SALINE SPRAY 0.65 % NA SOLN: 1.0000 | NASAL | 0 refills | Status: DC | PRN

## 2017-04-28 MED ORDER — GUAIFENESIN-CODEINE 100-10 MG/5ML PO SOLN: 5.0000 mL | Freq: Every evening | ORAL | 0 refills | Status: DC | PRN

## 2017-04-28 MED ORDER — IBUPROFEN 600 MG PO TABS: 600.0000 mg | ORAL_TABLET | Freq: Four times a day (QID) | ORAL | 0 refills | Status: DC | PRN

## 2017-04-28 NOTE — ED Triage Notes (Signed)
Pt states he thinks he has the flu   Pt is c/o headache, body aches, congestion, and nonproductive cough  PT states has had sxs for 4 days  Has been taking OTC medication with no relief

## 2017-04-28 NOTE — Discharge Instructions (Signed)
Follow up with your primary care doctor. Return here for worsening symptoms.  °
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
## 2017-06-03 ENCOUNTER — Ambulatory Visit (INDEPENDENT_AMBULATORY_CARE_PROVIDER_SITE_OTHER): Payer: No Typology Code available for payment source

## 2017-06-03 ENCOUNTER — Encounter: Payer: Self-pay | Admitting: Family Medicine

## 2017-06-03 ENCOUNTER — Ambulatory Visit (INDEPENDENT_AMBULATORY_CARE_PROVIDER_SITE_OTHER): Payer: No Typology Code available for payment source | Admitting: Family Medicine

## 2017-06-03 VITALS — BP 110/70 | HR 73 | Ht 71.5 in | Wt 189.2 lb

## 2017-06-03 DIAGNOSIS — Z0000 Encounter for general adult medical examination without abnormal findings: Secondary | ICD-10-CM | POA: Insufficient documentation

## 2017-06-03 DIAGNOSIS — Z131 Encounter for screening for diabetes mellitus: Secondary | ICD-10-CM | POA: Insufficient documentation

## 2017-06-03 DIAGNOSIS — Z720 Tobacco use: Secondary | ICD-10-CM | POA: Insufficient documentation

## 2017-06-03 DIAGNOSIS — G44229 Chronic tension-type headache, not intractable: Secondary | ICD-10-CM | POA: Insufficient documentation

## 2017-06-03 DIAGNOSIS — R079 Chest pain, unspecified: Secondary | ICD-10-CM | POA: Diagnosis not present

## 2017-06-03 LAB — LIPID PANEL
Cholesterol: 161 mg/dL (ref 0–200)
HDL: 48.3 mg/dL (ref >39.00)
LDL Cholesterol: 97 mg/dL (ref 0–99)
NonHDL: 112.83
Total CHOL/HDL Ratio: 3
Triglycerides: 80 mg/dL (ref 0.0–149.0)
VLDL: 16 mg/dL (ref 0.0–40.0)

## 2017-06-03 LAB — COMPREHENSIVE METABOLIC PANEL
ALT: 10 U/L (ref 0–53)
AST: 13 U/L (ref 0–37)
Albumin: 4.2 g/dL (ref 3.5–5.2)
Alkaline Phosphatase: 79 U/L (ref 39–117)
BUN: 15 mg/dL (ref 6–23)
CALCIUM: 10.4 mg/dL (ref 8.4–10.5)
CO2: 29 mEq/L (ref 19–32)
CREATININE: 1.18 mg/dL (ref 0.40–1.50)
Chloride: 107 mEq/L (ref 96–112)
GFR: 82.54 mL/min (ref >60.00)
Glucose, Bld: 89 mg/dL (ref 70–99)
Potassium: 5.2 mEq/L — ABNORMAL HIGH (ref 3.5–5.1)
Sodium: 143 mEq/L (ref 135–145)
TOTAL PROTEIN: 7.8 g/dL (ref 6.0–8.3)
Total Bilirubin: 0.9 mg/dL (ref 0.2–1.2)

## 2017-06-03 LAB — CBC
HCT: 45.8 % (ref 39.0–52.0)
Hemoglobin: 15.2 g/dL (ref 13.0–17.0)
MCHC: 33.3 g/dL (ref 30.0–36.0)
MCV: 97.3 fl (ref 78.0–100.0)
PLATELETS: 293 10*3/uL (ref 150.0–400.0)
RBC: 4.71 Mil/uL (ref 4.22–5.81)
RDW: 13.5 % (ref 11.5–15.5)
WBC: 7 10*3/uL (ref 4.0–10.5)

## 2017-06-03 LAB — TSH: TSH: 2.42 u[IU]/mL (ref 0.35–4.50)

## 2017-06-03 LAB — URINALYSIS, ROUTINE W REFLEX MICROSCOPIC
Bilirubin Urine: NEGATIVE
KETONES UR: NEGATIVE
Leukocytes, UA: NEGATIVE
NITRITE: NEGATIVE
TOTAL PROTEIN, URINE-UPE24: NEGATIVE
URINE GLUCOSE: NEGATIVE
UROBILINOGEN UA: 0.2 (ref 0.0–1.0)
pH: 6 (ref 5.0–8.0)

## 2017-06-03 LAB — PSA: PSA: 1.43 ng/mL (ref 0.10–4.00)

## 2017-06-03 NOTE — Patient Instructions (Addendum)
Acute Pain, Adult Acute pain is a type of pain that may last for just a few days or as long as six months. It is often related to an illness, injury, or medical procedure. Acute pain may be mild, moderate, or severe. It usually goes away once your injury has healed or you are no longer ill. Pain can make it hard for you to do daily activities. It can cause anxiety and lead to other problems if left untreated. Treatment depends on the cause and severity of your acute pain. Follow these instructions at home:  Check your pain level as told by your health care provider.  Take over-the-counter and prescription medicines only as told by your health care provider.  If you are taking prescription pain medicine: ? Ask your health care provider about taking a stool softener or laxative to prevent constipation. ? Do not stop taking the medicine suddenly. Talk to your health care provider about how and when to discontinue prescription pain medicine. ? If your pain is severe, do not take more pills than instructed by your health care provider. ? Do not take other over-the-counter pain medicines in addition to this medicine unless told by your health care provider. ? Do not drive or operate heavy machinery while taking prescription pain medicine.  Apply ice or heat as told by your health care provider. These may reduce swelling and pain.  Ask your health care provider if other strategies such as distraction, relaxation, or physical therapies can help your pain.  Keep all follow-up visits as told by your health care provider. This is important. Contact a health care provider if:  You have pain that is not controlled by medicine.  Your pain does not improve or gets worse.  You have side effects from pain medicines, such as vomitingor confusion. Get help right away if:  You have severe pain.  You have trouble breathing.  You lose consciousness.  You have chest pain or pressure that lasts for more  than a few minutes. Along with the chest pain you may: ? Have pain or discomfort in one or both arms, your back, neck, jaw, or stomach. ? Have shortness of breath. ? Break out in a cold sweat. ? Feel nauseous. ? Become light-headed. These symptoms may represent a serious problem that is an emergency. Do not wait to see if the symptoms will go away. Get medical help right away. Call your local emergency services (911 in the [place] Do not drive yourself to the hospital. This information is not intended to replace advice given to you by your health care provider. Make sure you discuss any questions you have with your health care provider. Document Released: 04/06/2015 Document Revised: 08/29/2015 Document Reviewed: 04/06/2015 Elsevier Interactive Patient Education  2018 [employer].  Aspirin and Your Heart Aspirin is a medicine that affects the way blood clots. Aspirin can be used to help reduce the risk of blood clots, heart attacks, and other heart-related problems. Should I take aspirin? Your health care provider will help you determine whether it is safe and beneficial for you to take aspirin daily. Taking aspirin daily may be beneficial if you:  Have had a heart attack or chest pain.  Have undergone open heart surgery such as coronary artery bypass surgery (CABG).  Have had coronary angioplasty.  Have experienced a stroke or transient ischemic attack (TIA).  Have peripheral vascular disease (PVD).  Have chronic heart rhythm problems such as atrial fibrillation.  Are there any risks of  taking aspirin daily? Daily use of aspirin can increase your risk of side effects. Some of these include:  Bleeding. Bleeding problems can be minor or serious. An example of a minor problem is a cut that does not stop bleeding. An example of a more serious problem is stomach bleeding or bleeding into the brain. Your risk of bleeding is increased if you are also taking non-steroidal anti-inflammatory  medicine (NSAIDs).  Increased bruising.  Upset stomach.  An allergic reaction. People who have nasal polyps have an increased risk of developing an aspirin allergy.  What are some guidelines I should follow when taking aspirin?  Take aspirin only as directed by your health care provider. Make sure you understand how much you should take and what form you should take. The two forms of aspirin are: ? Non-enteric-coated. This type of aspirin does not have a coating and is absorbed quickly. Non-enteric-coated aspirin is usually recommended for people with chest pain. This type of aspirin also comes in a chewable form. ? Enteric-coated. This type of aspirin has a special coating that releases the medicine very slowly. Enteric-coated aspirin causes less stomach upset than non-enteric-coated aspirin. This type of aspirin should not be chewed or crushed.  Drink alcohol in moderation. Drinking alcohol increases your risk of bleeding. When should I seek medical care?  You have unusual bleeding or bruising.  You have stomach pain.  You have an allergic reaction. Symptoms of an allergic reaction include: ? Hives. ? Itchy skin. ? Swelling of the lips, tongue, or face.  You have ringing in your ears. When should I seek immediate medical care?  Your bowel movements are bloody, dark red, or black in color.  You vomit or cough up blood.  You have blood in your urine.  You cough, wheeze, or feel short of breath. If you have any of the following symptoms, this is an emergency. Do not wait to see if the pain will go away. Get medical help at once. Call your local emergency services (911 in the [place] Do not drive yourself to the hospital.  You have severe chest pain, especially if the pain is crushing or pressure-like and spreads to the arms, back, neck, or jaw.  You have stroke-like symptoms, such as: ? Loss of vision. ? Difficulty talking. ? Numbness or weakness on one side of your  body. ? Numbness or weakness in your arm or leg. ? Not thinking clearly or feeling confused.  This information is not intended to replace advice given to you by your health care provider. Make sure you discuss any questions you have with your health care provider. Document Released: 03/04/2008 Document Revised: 07/30/2015 Document Reviewed: 06/27/2013 Elsevier Interactive Patient Education  2018 [place] with Quitting Smoking Quitting smoking is a physical and mental challenge. You will face cravings, withdrawal symptoms, and temptation. Before quitting, work with your health care provider to make a plan that can help you cope. Preparation can help you quit and keep you from giving in. How can I cope with cravings? Cravings usually last for 5-10 minutes. If you get through it, the craving will pass. Consider taking the following actions to help you cope with cravings:  Keep your mouth busy: ? Chew sugar-free gum. ? Suck on hard candies or a straw. ? Brush your teeth.  Keep your hands and body busy: ? Immediately change to a different activity when you feel a craving. ? Squeeze or play with a ball. ? Do an activity  or a hobby, like making bead jewelry, practicing needlepoint, or working with wood. ? Mix up your normal routine. ? Take a short exercise break. Go for a quick walk or run up and down stairs. ? Spend time in public places where smoking is not allowed.  Focus on doing something kind or helpful for someone else.  Call a friend or family member to talk during a craving.  Join a support group.  Call a quit line, such as 1-800-QUIT-NOW.  Talk with your health care provider about medicines that might help you cope with cravings and make quitting easier for you.  How can I deal with withdrawal symptoms? Your body may experience negative effects as it tries to get used to not having nicotine in the system. These effects are called withdrawal symptoms. They may  include:  Feeling hungrier than normal.  Trouble concentrating.  Irritability.  Trouble sleeping.  Feeling depressed.  Restlessness and agitation.  Craving a cigarette.  To manage withdrawal symptoms:  Avoid places, people, and activities that trigger your cravings.  Remember why you want to quit.  Get plenty of sleep.  Avoid coffee and other caffeinated drinks. These may worsen some of your symptoms.  How can I handle social situations? Social situations can be difficult when you are quitting smoking, especially in the first few weeks. To manage this, you can:  Avoid parties, bars, and other social situations where people might be smoking.  Avoid alcohol.  Leave right away if you have the urge to smoke.  Explain to your family and friends that you are quitting smoking. Ask for understanding and support.  Plan activities with friends or family where smoking is not an option.  What are some ways I can cope with stress? Wanting to smoke may cause stress, and stress can make you want to smoke. Find ways to manage your stress. Relaxation techniques can help. For example:  Breathe slowly and deeply, in through your nose and out through your mouth.  Listen to soothing, relaxing music.  Talk with a family member or friend about your stress.  Light a candle.  Soak in a bath or take a shower.  Think about a peaceful place.  What are some ways I can prevent weight gain? Be aware that many people gain weight after they quit smoking. However, not everyone does. To keep from gaining weight, have a plan in place before you quit and stick to the plan after you quit. Your plan should include:  Having healthy snacks. When you have a craving, it may help to: ? Eat plain popcorn, crunchy carrots, celery, or other cut vegetables. ? Chew sugar-free gum.  Changing how you eat: ? Eat small portion sizes at meals. ? Eat 4-6 small meals throughout the day instead of 1-2 large  meals a day. ? Be mindful when you eat. Do not watch television or do other things that might distract you as you eat.  Exercising regularly: ? Make time to exercise each day. If you do not have time for a long workout, do short bouts of exercise for 5-10 minutes several times a day. ? Do some form of strengthening exercise, like weight lifting, and some form of aerobic exercise, like running or swimming.  Drinking plenty of water or other low-calorie or no-calorie drinks. Drink 6-8 glasses of water daily, or as much as instructed by your health care provider.  Summary  Quitting smoking is a physical and mental challenge. You will face cravings, withdrawal  symptoms, and temptation to smoke again. Preparation can help you as you go through these challenges.  You can cope with cravings by keeping your mouth busy (such as by chewing gum), keeping your body and hands busy, and making calls to family, friends, or a helpline for people who want to quit smoking.  You can cope with withdrawal symptoms by avoiding places where people smoke, avoiding drinks with caffeine, and getting plenty of rest.  Ask your health care provider about the different ways to prevent weight gain, avoid stress, and handle social situations. This information is not intended to replace advice given to you by your health care provider. Make sure you discuss any questions you have with your health care provider. Document Released: 03/19/2016 Document Revised: 03/19/2016 Document Reviewed: 03/19/2016 Elsevier Interactive Patient Education  2018 [place] Headache Without Cause A headache is pain or discomfort felt around the head or neck area. There are many causes and types of headaches. In some cases, the cause may not be found. Follow these instructions at home: Managing pain  Take over-the-counter and prescription medicines only as told by your doctor.  Lie down in a dark, quiet room when you have a  headache.  If directed, apply ice to the head and neck area: ? Put ice in a plastic bag. ? Place a towel between your skin and the bag. ? Leave the ice on for 20 minutes, 2-3 times per day.  Use a heating pad or hot shower to apply heat to the head and neck area as told by your doctor.  Keep lights dim if bright lights bother you or make your headaches worse. Eating and drinking  Eat meals on a regular schedule.  Lessen how much alcohol you drink.  Lessen how much caffeine you drink, or stop drinking caffeine. General instructions  Keep all follow-up visits as told by your doctor. This is important.  Keep a journal to find out if certain things bring on headaches. For example, write down: ? What you eat and drink. ? How much sleep you get. ? Any change to your diet or medicines.  Relax by getting a massage or doing other relaxing activities.  Lessen stress.  Sit up straight. Do not tighten (tense) your muscles.  Do not use tobacco products. This includes cigarettes, chewing tobacco, or e-cigarettes. If you need help quitting, ask your doctor.  Exercise regularly as told by your doctor.  Get enough sleep. This often means 7-9 hours of sleep. Contact a doctor if:  Your symptoms are not helped by medicine.  You have a headache that feels different than the other headaches.  You feel sick to your stomach (nauseous) or you throw up (vomit).  You have a fever. Get help right away if:  Your headache becomes really bad.  You keep throwing up.  You have a stiff neck.  You have trouble seeing.  You have trouble speaking.  You have pain in the eye or ear.  Your muscles are weak or you lose muscle control.  You lose your balance or have trouble walking.  You feel like you will pass out (faint) or you pass out.  You have confusion. This information is not intended to replace advice given to you by your health care provider. Make sure you discuss any questions you  have with your health care provider. Document Released: 12/30/2007 Document Revised: 08/28/2015 Document Reviewed: 07/15/2014 Elsevier Interactive Patient Education  2018 [place] Risks of  Smoking Smoking cigarettes is very bad for your health. Tobacco smoke has over 200 known poisons in it. It contains the poisonous gases nitrogen oxide and carbon monoxide. There are over 60 chemicals in tobacco smoke that cause cancer. Smoking is difficult to quit because a chemical in tobacco, called nicotine, causes addiction or dependence. When you smoke and inhale, nicotine is absorbed rapidly into the bloodstream through your lungs. Both inhaled and non-inhaled nicotine may be addictive. What are the risks of cigarette smoke? Cigarette smokers have an increased risk of many serious medical problems, including:  Lung cancer.  Lung disease, such as pneumonia, bronchitis, and emphysema.  Chest pain (angina) and heart attack because the heart is not getting enough oxygen.  Heart disease and peripheral blood vessel disease.  High blood pressure (hypertension).  Stroke.  Oral cancer, including cancer of the lip, mouth, or voice box.  Bladder cancer.  Pancreatic cancer.  Cervical cancer.  Pregnancy complications, including premature birth.  Stillbirths and smaller newborn babies, birth defects, and genetic damage to sperm.  Early menopause.  Lower estrogen level for women.  Infertility.  Facial wrinkles.  Blindness.  Increased risk of broken bones (fractures).  Senile dementia.  Stomach ulcers and internal bleeding.  Delayed wound healing and increased risk of complications during surgery.  Even smoking lightly shortens your life expectancy by several years.  Because of secondhand smoke exposure, children of smokers have an increased risk of the following:  Sudden infant death syndrome (SIDS).  Respiratory infections.  Lung cancer.  Heart disease.  Ear  infections.  What are the benefits of quitting? There are many health benefits of quitting smoking. Here are some of them:  Within days of quitting smoking, your risk of having a heart attack decreases, your blood flow improves, and your lung capacity improves. Blood pressure, pulse rate, and breathing patterns start returning to normal soon after quitting.  Within months, your lungs may clear up completely.  Quitting for 10 years reduces your risk of developing lung cancer and heart disease to almost that of a nonsmoker.  People who quit may see an improvement in their overall quality of life.  How do I quit smoking? Smoking is an addiction with both physical and psychological effects, and longtime habits can be hard to change. Your health care provider can recommend:  Programs and community resources, which may include group support, education, or talk therapy.  Prescription medicines to help reduce cravings.  Nicotine replacement products, such as patches, gum, and nasal sprays. Use these products only as directed. Do not replace cigarette smoking with electronic cigarettes, which are commonly called e-cigarettes. The safety of e-cigarettes is not known, and some may contain harmful chemicals.  A combination of two or more of these methods.  Where to find more information:  American Lung Association: www.lung.org  American Cancer Society: www.cancer.org Summary  Smoking cigarettes is very bad for your health. Cigarette smokers have an increased risk of many serious medical problems, including several cancers, heart disease, and stroke.  Smoking is an addiction with both physical and psychological effects, and longtime habits can be hard to change.  By stopping right away, you can greatly reduce the risk of medical problems for you and your family.  To help you quit smoking, your health care provider can recommend programs, community resources, prescription medicines, and  nicotine replacement products such as patches, gum, and nasal sprays. This information is not intended to replace advice given to you by your health care provider.  Make sure you discuss any questions you have with your health care provider. Document Released: 04/29/2004 Document Revised: 03/26/2016 Document Reviewed: 03/26/2016 Elsevier Interactive Patient Education  2017 [place].  How to Increase Your Level of Physical Activity Getting regular physical activity is important for your overall health and well-being. Most people do not get enough exercise. There are easy ways to increase your level of physical activity, even if you have not been very active in the past or you are just starting out. Why is physical activity important? Physical activity has many short-term and long-term health benefits. Regular exercise can:  Help you lose weight or maintain a healthy weight.  Strengthen your muscles and bones.  Boost your mood and improve self-esteem.  Reduce your risk of certain long-term (chronic) diseases, like heart disease, cancer, and diabetes.  Help you stay capable of walking and moving around (mobile) as you age.  Prevent accidents, such as falls, as you age.  Increase life expectancy.  What are the benefits of being physically active on a regular basis? In addition to improving your physical health, being physically active on most days of the week can help you in ways that you may not expect. Benefits of regular physical activity may include:  Feeling good about your body.  Being able to move around more easily and for longer periods of time without getting tired (increased stamina).  Finding new sources of fun and enjoyment.  Meeting new people who share a common interest.  Being able to fight off illness better (enhanced immunity).  Being able to sleep better.  What can happen if I am not physically active on a regular basis? Not getting enough physical activity  can lead to an unhealthy lifestyle and future health problems. This can increase your chances of:  Becoming overweight or obese.  Becoming sick.  Developing chronic illnesses, like heart disease or diabetes.  Having mental health problems, like depression or anxiety.  Having sleep problems.  Having trouble walking or getting yourself around (reduced mobility).  Injuring yourself in a fall as you get older.  What steps can I take to be more physically active?  Check with your health care provider about how to get started. Ask your health care provider what activities are safe for you.  Start out slowly. Walking or doing some simple chair exercises is a good place to start, especially if you have not been active before or for a long time.  Try to find activities that you enjoy. You are more likely to commit to an exercise routine if it does not feel like a chore.  If you have bone or joint problems, choose low-impact exercises, like walking or swimming.  Include physical activity in your everyday routine.  Invite friends or family members to exercise with you. This also will help you commit to your workout plan.  Set goals that you can work toward.  Aim for at least 150 minutes of moderate-intensity exercise each week. Examples of moderate-intensity exercise include walking or riding a bike. Where to find more information:  Centers for Disease Control and Prevention: [employer].is  President's [employer] on [employer], Sports & Nutrition www.http://villegas.org/  ChooseMy[employer]ate: [employer] Contact a health care provider if:  You have headaches, muscle aches, or joint pain.  You feel dizzy or light-headed while exercising.  You faint.  You have chest pain while exercising. Summary  Exercise benefits your mind and body at any age, even if you are just starting out.  If you  have a chronic illness or have not been  active for a while, check with your health care provider before increasing your physical activity.  Choose activities that are safe and enjoyable for you.Ask your health care provider what activities are safe for you.  Start slowly. Tell your health care provider if you have problems as you start to increase your activity level. This information is not intended to replace advice given to you by your health care provider. Make sure you discuss any questions you have with your health care provider. Document Released: 03/11/2016 Document Revised: 03/11/2016 Document Reviewed: 03/11/2016 Elsevier Interactive Patient Education  2018 [place].  Nonspecific Chest Pain Chest pain can be caused by many different conditions. There is a chance that your pain could be related to something serious, such as a heart attack or a blood clot in your lungs. Chest pain can also be caused by conditions that are not life-threatening. If you have chest pain, it is very important to follow up with your doctor. Follow these instructions at home: Medicines  If you were prescribed an antibiotic medicine, take it as told by your doctor. Do not stop taking the antibiotic even if you start to feel better.  Take over-the-counter and prescription medicines only as told by your doctor. Lifestyle  Do not use any products that contain nicotine or tobacco, such as cigarettes and e-cigarettes. If you need help quitting, ask your doctor.  Do not drink alcohol.  Make lifestyle changes as told by your doctor. These may include: ? Getting regular exercise. Ask your doctor for some activities that are safe for you. ? Eating a heart-healthy diet. A diet specialist (dietitian) can help you to learn healthy eating options. ? Staying at a healthy weight. ? Managing diabetes, if needed. ? Lowering your stress, as with deep breathing or spending time in nature. General instructions  Avoid any activities that make you feel  chest pain.  If your chest pain is because of heartburn: ? Raise (elevate) the head of your bed about 6 inches (15 cm). You can do this by putting blocks under the bed legs at the head of the bed. ? Do not sleep with extra pillows under your head. That does not help heartburn.  Keep all follow-up visits as told by your doctor. This is important. This includes any further testing if your chest pain does not go away. Contact a doctor if:  Your chest pain does not go away.  You have a rash with blisters on your chest.  You have a fever.  You have chills. Get help right away if:  Your chest pain is worse.  You have a cough that gets worse, or you cough up blood.  You have very bad (severe) pain in your belly (abdomen).  You are very weak.  You pass out (faint).  You have either of these for no clear reason: ? Sudden chest discomfort. ? Sudden discomfort in your arms, back, neck, or jaw.  You have shortness of breath at any time.  You suddenly start to sweat, or your skin gets clammy.  You feel sick to your stomach (nauseous).  You throw up (vomit).  You suddenly feel light-headed or dizzy.  Your heart starts to beat fast, or it feels like it is skipping beats. These symptoms may be an emergency. Do not wait to see if the symptoms will go away. Get medical help right away. Call your local emergency services (911 in the [place] Do not  drive yourself to the hospital. This information is not intended to replace advice given to you by your health care provider. Make sure you discuss any questions you have with your health care provider. Document Released: 09/08/2007 Document Revised: 12/15/2015 Document Reviewed: 12/15/2015 Elsevier Interactive Patient Education  2017 Ethan Years, Male Preventive care refers to lifestyle choices and visits with your health care provider that can promote health and wellness. What does preventive care  include?  A yearly physical exam. This is also called an annual well check.  Dental exams once or twice a year.  Routine eye exams. Ask your health care provider how often you should have your eyes checked.  Personal lifestyle choices, including: ? Daily care of your teeth and gums. ? Regular physical activity. ? Eating a healthy diet. ? Avoiding tobacco and drug use. ? Limiting alcohol use. ? Practicing safe sex. ? Taking low-dose aspirin every day starting at age 65. What happens during an annual well check? The services and screenings done by your health care provider during your annual well check will depend on your age, overall health, lifestyle risk factors, and family history of disease. Counseling Your health care provider may ask you questions about your:  Alcohol use.  Tobacco use.  Drug use.  Emotional well-being.  Home and relationship well-being.  Sexual activity.  Eating habits.  Work and work Statistician.  Screening You may have the following tests or measurements:  Height, weight, and BMI.  Blood pressure.  Lipid and cholesterol levels. These may be checked every 5 years, or more frequently if you are over 28 years old.  Skin check.  Lung cancer screening. You may have this screening every year starting at age 73 if you have a 30-pack-year history of smoking and currently smoke or have quit within the past 15 years.  Fecal occult blood test (FOBT) of the stool. You may have this test every year starting at age 84.  Flexible sigmoidoscopy or colonoscopy. You may have a sigmoidoscopy every 5 years or a colonoscopy every 10 years starting at age 51.  Prostate cancer screening. Recommendations will vary depending on your family history and other risks.  Hepatitis C blood test.  Hepatitis B blood test.  Sexually transmitted disease (STD) testing.  Diabetes screening. This is done by checking your blood sugar (glucose) after you have not eaten  for a while (fasting). You may have this done every 1-3 years.  Discuss your test results, treatment options, and if necessary, the need for more tests with your health care provider. Vaccines Your health care provider may recommend certain vaccines, such as:  Influenza vaccine. This is recommended every year.  Tetanus, diphtheria, and acellular pertussis (Tdap, Td) vaccine. You may need a Td booster every 10 years.  Varicella vaccine. You may need this if you have not been vaccinated.  Zoster vaccine. You may need this after age 73.  Measles, mumps, and rubella (MMR) vaccine. You may need at least one dose of MMR if you were born in 1957 or later. You may also need a second dose.  Pneumococcal 13-valent conjugate (PCV13) vaccine. You may need this if you have certain conditions and have not been vaccinated.  Pneumococcal polysaccharide (PPSV23) vaccine. You may need one or two doses if you smoke cigarettes or if you have certain conditions.  Meningococcal vaccine. You may need this if you have certain conditions.  Hepatitis A vaccine. You may need this if you have certain  conditions or if you travel or work in places where you may be exposed to hepatitis A.  Hepatitis B vaccine. You may need this if you have certain conditions or if you travel or work in places where you may be exposed to hepatitis B.  Haemophilus influenzae type b (Hib) vaccine. You may need this if you have certain risk factors.  Talk to your health care provider about which screenings and vaccines you need and how often you need them. This information is not intended to replace advice given to you by your health care provider. Make sure you discuss any questions you have with your health care provider. Document Released: 04/18/2015 Document Revised: 12/10/2015 Document Reviewed: 01/21/2015 Elsevier Interactive Patient Education  [employer].

## 2017-06-03 NOTE — Progress Notes (Signed)
Subjective:  Patient ID: Randy Park, male    DOB: 04-27-1960  Age: 57 y.o. MRN: [id number] If she can get L.  Health CC: New Patient (Initial Visit)   HPI Randy Park presents for establishment of care.  He is fasting this morning.  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  He is on the 5-year plan for colonoscopies.  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  He tells me that he has no problem receiving a 2-year card.  He has smoked a pack a day for 20 years.  He does not use illicit drugs.  He will drink up to a sixpack over the weekend.  He has no regular exercise other than his job.  For the last year he has experienced intermittent left lower anterior chest pain that is sharp.  It is not related to exertion.  He experiences shortness of breath but is not necessarily related to the chest pain.  He forcefully requests a referral to a cardiologist.  he has a past medical history of GERD but says that that has not bothered him him in some time.  He has no nausea or vomiting or diaphoresis with the chest pain.  His father's health history is unknown.  His mother had lung issues.  Patient lives alone.  He has 1 biological daughter.  He tells me that he has another daughter who is not his biological daughter from a relationship.  He has a 2-year history of generalized headaches.  He says that they occur every other day and do not go let them go away unless he takes Excedrin.  There are no prodromal symptoms.  There is no nausea and vomiting.  The headaches have been nonprogressive.  His mother had migraines he believed.  Patient denies a past medical history of headaches for himself.  He requests an HIV test.  When I inquired about his interest in the test he would not expound.  Patient has a history of periodontal disease and was told that this could be associated  with headaches.  History Randy Park has a past medical history of COLITIS (08/26/2007), Diverticulosis, GERD (gastroesophageal reflux disease), Headache, Hiatal hernia, and Hyperplastic colonic polyp - colonoscopy 2008 (08/26/2007).   He has a past surgical history that includes head surgery; laparoscopy (N/A, 10/08/2013); Colon resection (N/A, 10/18/2013); Colostomy (N/A, 10/18/2013); Colon surgery; and Colostomy takedown (N/A, 07/09/2014).   His family history includes Heart disease in his mother.He reports that he has been smoking cigarettes.  He has been smoking about 1.00 pack per day. he has never used smokeless tobacco. He reports that he drinks alcohol. He reports that he does not use drugs.  Outpatient Medications Prior to Visit  Medication Sig Dispense Refill  . ibuprofen (ADVIL,MOTRIN) 600 MG tablet Take 1 tablet (600 mg total) by mouth every 6 (six) hours as needed. 30 tablet 0  . Multiple Vitamin (MULTIVITAMIN WITH MINERALS) TABS tablet Take 1 tablet by mouth daily.    . sodium chloride (OCEAN) 0.65 % SOLN nasal spray Place 1 spray into both nostrils as needed for congestion. 1 Bottle 0  . benzonatate (TESSALON) 100 MG capsule Take 1 capsule (100 mg total) by mouth every 8 (eight) hours. (Patient not taking: Reported on 06/03/2017) 21 capsule 0  . guaiFENesin-codeine 100-10 MG/5ML syrup Take 5 mLs by mouth at bedtime and may repeat dose one  time if needed. (Patient not taking: Reported on 06/03/2017) 60 mL 0  . triamcinolone cream (KENALOG) 0.1 % Apply 1 application topically 2 (two) times daily. (Patient not taking: Reported on 06/03/2017) 30 g 0  . hydrOXYzine (VISTARIL) 100 MG capsule Take 1 capsule (100 mg total) by mouth 3 (three) times daily as needed for itching. (Patient not taking: Reported on 06/03/2017) 15 capsule 0   No facility-administered medications prior to visit.     ROS Review of Systems  Constitutional: Negative for chills, fatigue, fever and unexpected weight change.  HENT:  Negative.   Eyes: Negative for photophobia and visual disturbance.  Respiratory: Positive for cough and shortness of breath. Negative for chest tightness and wheezing.   Cardiovascular: Positive for chest pain. Negative for palpitations and leg swelling.  Gastrointestinal: Negative for abdominal pain, anal bleeding, blood in stool, constipation, nausea, rectal pain and vomiting.  Endocrine: Negative for cold intolerance and heat intolerance.  Genitourinary: Negative for difficulty urinating, frequency and urgency.  Musculoskeletal: Negative for gait problem and myalgias.  Skin: Negative for color change and wound.  Allergic/Immunologic: Negative for immunocompromised state.  Neurological: Positive for headaches. Negative for speech difficulty and weakness.  Hematological: Does not bruise/bleed easily.  Psychiatric/Behavioral: Negative.     Objective:  BP 110/70 (BP Location: Left Arm, Patient Position: Sitting, Cuff Size: Normal)   Pulse 73   Ht 5' 11.5" (1.816 m)   Wt 189 lb 3.2 oz (85.8 kg)   BMI 26.02 kg/m   Physical Exam  Constitutional: He is oriented to person, place, and time. He appears well-developed and well-nourished. No distress.  HENT:  Head: Normocephalic and atraumatic.  Right Ear: External ear normal.  Left Ear: External ear normal.  Mouth/Throat: Oropharynx is clear and moist. No oropharyngeal exudate.  Eyes: Conjunctivae are normal. Pupils are equal, round, and reactive to light. Right eye exhibits no discharge. Left eye exhibits no discharge. No scleral icterus.  Neck: Neck supple. No JVD present. No tracheal deviation present. No thyromegaly present.  Cardiovascular: Normal rate, regular rhythm and normal heart sounds.  Pulmonary/Chest: Breath sounds normal. No stridor. No respiratory distress. He has no wheezes. He has no rales.  Abdominal: Soft. Bowel sounds are normal. He exhibits no distension. There is no tenderness. There is no rebound and no guarding.    Genitourinary: Rectal exam shows no external hemorrhoid, no internal hemorrhoid, no fissure, no mass, no tenderness, anal tone normal and guaiac negative stool. Prostate is not enlarged and not tender.  Lymphadenopathy:    He has no cervical adenopathy.  Neurological: He is alert and oriented to person, place, and time.  Skin: Skin is warm and dry. He is not diaphoretic.  Psychiatric: He has a normal mood and affect. His behavior is normal.  Patient seem to be guarded and suspicious of my questioning.  He was borderline confrontational.      Assessment & Plan:   Ryett was seen today for new patient (initial visit).  Diagnoses and all orders for this visit:  Tobacco use  Chest pain, unspecified type -     EKG 12-Lead -     DG Chest 2 View; Future -     Lipid panel  Chronic tension-type headache, not intractable  Healthcare maintenance -     CBC -     Comprehensive metabolic panel -     HIV antibody -     Lipid panel -     PSA -     TSH -  Urinalysis, Routine w reflex microscopic -     Hepatitis C antibody   I have discontinued Franky K. Perine's hydrOXYzine and sodium chloride. I am also having him maintain his multivitamin with minerals, triamcinolone cream, benzonatate, guaiFENesin-codeine, and ibuprofen.  No orders of the defined types were placed in this encounter.  Patient obviously removed main but guarded and suspicious during our time together.  He is not interested in quitting smoking at this time.  He is up-to-date on his colonoscopy.  Chest x-ray showed mild flattening of the diaphragm no told him that this could be associated with his smoking.  I urged him to quit.  He said that he would consider that.  He agrees to come back in 1 month for follow-up.  Follow-up: No Follow-up on file.  Mliss Sax, MD

## 2017-06-04 LAB — HIV ANTIBODY (ROUTINE TESTING W REFLEX): HIV: NONREACTIVE

## 2017-06-04 LAB — HEPATITIS C ANTIBODY
HEP C AB: NONREACTIVE
SIGNAL TO CUT-OFF: 0.02 (ref <1.00)

## 2017-06-07 ENCOUNTER — Other Ambulatory Visit: Payer: Self-pay

## 2017-06-07 DIAGNOSIS — E875 Hyperkalemia: Secondary | ICD-10-CM

## 2017-06-08 ENCOUNTER — Other Ambulatory Visit (INDEPENDENT_AMBULATORY_CARE_PROVIDER_SITE_OTHER): Payer: No Typology Code available for payment source

## 2017-06-08 ENCOUNTER — Encounter: Payer: Self-pay | Admitting: Family Medicine

## 2017-06-08 DIAGNOSIS — E875 Hyperkalemia: Secondary | ICD-10-CM | POA: Diagnosis not present

## 2017-06-09 LAB — POTASSIUM: Potassium: 4.6 mEq/L (ref 3.5–5.1)

## 2017-07-04 ENCOUNTER — Ambulatory Visit: Payer: No Typology Code available for payment source | Admitting: Family Medicine

## 2017-07-11 ENCOUNTER — Ambulatory Visit: Payer: No Typology Code available for payment source | Admitting: Family Medicine

## 2017-07-18 ENCOUNTER — Ambulatory Visit: Payer: No Typology Code available for payment source | Admitting: Family Medicine

## 2017-07-18 DIAGNOSIS — Z2089 Contact with and (suspected) exposure to other communicable diseases: Secondary | ICD-10-CM

## 2017-09-10 ENCOUNTER — Encounter (HOSPITAL_BASED_OUTPATIENT_CLINIC_OR_DEPARTMENT_OTHER): Payer: Self-pay

## 2017-09-10 ENCOUNTER — Emergency Department (HOSPITAL_BASED_OUTPATIENT_CLINIC_OR_DEPARTMENT_OTHER)
Admission: EM | Admit: 2017-09-10 | Discharge: 2017-09-10 | Disposition: A | Discharge status: Home / Self Care | Payer: No Typology Code available for payment source | Attending: Emergency Medicine | Admitting: Emergency Medicine

## 2017-09-10 ENCOUNTER — Other Ambulatory Visit: Payer: Self-pay

## 2017-09-10 DIAGNOSIS — Z79899 Other long term (current) drug therapy: Secondary | ICD-10-CM | POA: Diagnosis not present

## 2017-09-10 DIAGNOSIS — F17210 Nicotine dependence, cigarettes, uncomplicated: Secondary | ICD-10-CM | POA: Insufficient documentation

## 2017-09-10 DIAGNOSIS — R21 Rash and other nonspecific skin eruption: Secondary | ICD-10-CM | POA: Insufficient documentation

## 2017-09-10 MED ORDER — DIPHENHYDRAMINE HCL 25 MG PO TABS: 25.0000 mg | ORAL_TABLET | Freq: Four times a day (QID) | ORAL | 0 refills | Status: DC

## 2017-09-10 MED ORDER — TRIAMCINOLONE ACETONIDE 0.1 % EX CREA: 1.0000 "application " | TOPICAL_CREAM | Freq: Two times a day (BID) | CUTANEOUS | 0 refills | Status: DC

## 2017-09-10 NOTE — ED Triage Notes (Signed)
Pt reports breaking out into a rash x 2 days. Pt states rash in on trunk, neck, and extremities. Pt states the rash is pruritic, painful, and dry.
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
## 2018-07-25 ENCOUNTER — Telehealth: Payer: Self-pay | Admitting: Family Medicine

## 2018-07-25 NOTE — Telephone Encounter (Signed)
Called and talked with patient. Patient states that he does not want to schedule virtual visit at this time. Will call to schedule.

## 2018-08-01 ENCOUNTER — Telehealth: Payer: Self-pay | Admitting: Family Medicine

## 2018-08-01 NOTE — Telephone Encounter (Signed)
I called to schedule follow up appointment with Dr. Doreene Burke. I was unable to leave message on voicemail.

## 2019-05-11 ENCOUNTER — Ambulatory Visit (INDEPENDENT_AMBULATORY_CARE_PROVIDER_SITE_OTHER): Payer: PRIVATE HEALTH INSURANCE

## 2019-05-11 ENCOUNTER — Other Ambulatory Visit: Payer: Self-pay

## 2019-05-11 ENCOUNTER — Ambulatory Visit (INDEPENDENT_AMBULATORY_CARE_PROVIDER_SITE_OTHER): Payer: PRIVATE HEALTH INSURANCE | Admitting: Family Medicine

## 2019-05-11 ENCOUNTER — Encounter: Payer: Self-pay | Admitting: Family Medicine

## 2019-05-11 VITALS — BP 128/64 | HR 80 | Temp 97.8°F | Ht 71.0 in | Wt 193.2 lb

## 2019-05-11 DIAGNOSIS — M25552 Pain in left hip: Secondary | ICD-10-CM | POA: Diagnosis not present

## 2019-05-11 DIAGNOSIS — Z720 Tobacco use: Secondary | ICD-10-CM

## 2019-05-11 MED ORDER — KETOROLAC TROMETHAMINE 60 MG/2ML IM SOLN
60.0000 mg | Freq: Once | INTRAMUSCULAR | Status: AC
  Administered 2019-05-11: 60 mg via INTRAMUSCULAR

## 2019-05-11 MED ORDER — MELOXICAM 15 MG PO TABS: 15.0000 mg | ORAL_TABLET | Freq: Every day | ORAL | 0 refills | Status: DC
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
## 2019-05-11 NOTE — Patient Instructions (Signed)
Coping with Quitting Smoking  Quitting smoking is a physical and mental challenge. You will face cravings, withdrawal symptoms, and temptation. Before quitting, work with your health care provider to make a plan that can help you cope. Preparation can help you quit and keep you from giving in. How can I cope with cravings? Cravings usually last for 5-10 minutes. If you get through it, the craving will pass. Consider taking the following actions to help you cope with cravings:  Keep your mouth busy: ? Chew sugar-free gum. ? Suck on hard candies or a straw. ? Brush your teeth.  Keep your hands and body busy: ? Immediately change to a different activity when you feel a craving. ? Squeeze or play with a ball. ? Do an activity or a hobby, like making bead jewelry, practicing needlepoint, or working with wood. ? Mix up your normal routine. ? Take a short exercise break. Go for a quick walk or run up and down stairs. ? Spend time in public places where smoking is not allowed.  Focus on doing something kind or helpful for someone else.  Call a friend or family member to talk during a craving.  Join a support group.  Call a quit line, such as 1-800-QUIT-NOW.  Talk with your health care provider about medicines that might help you cope with cravings and make quitting easier for you. How can I deal with withdrawal symptoms? Your body may experience negative effects as it tries to get used to not having nicotine in the system. These effects are called withdrawal symptoms. They may include:  Feeling hungrier than normal.  Trouble concentrating.  Irritability.  Trouble sleeping.  Feeling depressed.  Restlessness and agitation.  Craving a cigarette. To manage withdrawal symptoms:  Avoid places, people, and activities that trigger your cravings.  Remember why you want to quit.  Get plenty of sleep.  Avoid coffee and other caffeinated drinks. These may worsen some of your  symptoms. How can I handle social situations? Social situations can be difficult when you are quitting smoking, especially in the first few weeks. To manage this, you can:  Avoid parties, bars, and other social situations where people might be smoking.  Avoid alcohol.  Leave right away if you have the urge to smoke.  Explain to your family and friends that you are quitting smoking. Ask for understanding and support.  Plan activities with friends or family where smoking is not an option. What are some ways I can cope with stress? Wanting to smoke may cause stress, and stress can make you want to smoke. Find ways to manage your stress. Relaxation techniques can help. For example:  Breathe slowly and deeply, in through your nose and out through your mouth.  Listen to soothing, relaxing music.  Talk with a family member or friend about your stress.  Light a candle.  Soak in a bath or take a shower.  Think about a peaceful place. What are some ways I can prevent weight gain? Be aware that many people gain weight after they quit smoking. However, not everyone does. To keep from gaining weight, have a plan in place before you quit and stick to the plan after you quit. Your plan should include:  Having healthy snacks. When you have a craving, it may help to: ? Eat plain popcorn, crunchy carrots, celery, or other cut vegetables. ? Chew sugar-free gum.  Changing how you eat: ? Eat small portion sizes at meals. ? Eat 4-6 small meals   throughout the day instead of 1-2 large meals a day. ? Be mindful when you eat. Do not watch television or do other things that might distract you as you eat.  Exercising regularly: ? Make time to exercise each day. If you do not have time for a long workout, do short bouts of exercise for 5-10 minutes several times a day. ? Do some form of strengthening exercise, like weight lifting, and some form of aerobic exercise, like running or swimming.  Drinking  plenty of water or other low-calorie or no-calorie drinks. Drink 6-8 glasses of water daily, or as much as instructed by your health care provider. Summary  Quitting smoking is a physical and mental challenge. You will face cravings, withdrawal symptoms, and temptation to smoke again. Preparation can help you as you go through these challenges.  You can cope with cravings by keeping your mouth busy (such as by chewing gum), keeping your body and hands busy, and making calls to family, friends, or a helpline for people who want to quit smoking.  You can cope with withdrawal symptoms by avoiding places where people smoke, avoiding drinks with caffeine, and getting plenty of rest.  Ask your health care provider about the different ways to prevent weight gain, avoid stress, and handle social situations. This information is not intended to replace advice given to you by your health care provider. Make sure you discuss any questions you have with your health care provider. Document Revised: 03/04/2017 Document Reviewed: 03/19/2016 Elsevier Patient Education  2020 Else[employer] Inc[employer]  

## 2019-05-21 ENCOUNTER — Ambulatory Visit (INDEPENDENT_AMBULATORY_CARE_PROVIDER_SITE_OTHER): Payer: PRIVATE HEALTH INSURANCE | Admitting: Family Medicine

## 2019-05-21 ENCOUNTER — Encounter: Payer: Self-pay | Admitting: Family Medicine

## 2019-05-21 VITALS — BP 120/80 | HR 63 | Ht 71.0 in | Wt 192.0 lb

## 2019-05-21 DIAGNOSIS — M7062 Trochanteric bursitis, left hip: Secondary | ICD-10-CM | POA: Diagnosis not present

## 2019-05-21 DIAGNOSIS — M25552 Pain in left hip: Secondary | ICD-10-CM

## 2019-05-21 MED ORDER — HYDROCODONE-ACETAMINOPHEN 5-325 MG PO TABS: 1.0000 | ORAL_TABLET | Freq: Four times a day (QID) | ORAL | 0 refills | Status: DC | PRN

## 2019-05-21 NOTE — Patient Instructions (Addendum)
Thank you for coming in today. Call or go to the ER if you develop a large red swollen joint with extreme pain or oozing puss.  Attend PT.  Recheck in 1 month.  I am happy to fill forms out if needed.   Use hydrocodone sparingly.  Do not drive with this medicine.

## 2019-05-21 NOTE — Progress Notes (Signed)
Subjective:    I'm seeing this patient as a consultation for:  Dr. Ethelene Hal. Note will be routed back to referring provider/PCP.  CC: L lateral hip pain  I, Molly Weber, LAT, ATC, am serving as scribe for Dr. [name].  HPI: Pt is a 60 y/o male presenting w/ c/o L lateral hip, buttock and thigh pain x 3-4 weeks.  He denies any LE numbness/tingling.  He reports low back pain and some L LE weakness.  He has seen his PCP and was prescribed Meloxicam.  Since seeing his PCP on 05/11/19 and starting Meloxicam, pt reports slight decrease in his L lateral hip pain from a 10/10 to a 8/10.  He describes his pain as an aching/throbbing pain.  He reports having fallen 2x when trying to put weight on his L LE and his L leg has given way due to pain and weakness.  Aggravating factors: Prolonged sitting; laying on his R side; lumbar flexion; active L hip flexion Treatments tried: Meloxicam; pain patches; topical rubs; Advil  Diagnostic testing: L hip XR- 05/11/19  Past medical history, Surgical history, Family history, Social history, Allergies, and medications have been entered into the medical record, reviewed.   Review of Systems: No new headache, visual changes, nausea, vomiting, diarrhea, constipation, dizziness, abdominal pain, skin rash, fevers, chills, night sweats, weight loss, swollen lymph nodes, body aches, joint swelling, muscle aches, chest pain, shortness of breath, mood changes, visual or auditory hallucinations.   Objective:    Vitals:   05/21/19 0811  BP: 120/80  Pulse: 63  SpO2: 97%   General: Well Developed, well nourished, and in no acute distress.  Neuro/Psych: Alert and oriented x3, extra-ocular muscles intact, able to move all 4 extremities, sensation grossly intact. Skin: Warm and dry, no rashes noted.  Respiratory: Not using accessory muscles, speaking in full sentences, trachea midline.  Cardiovascular: Pulses palpable, no extremity edema. Abdomen: Does not appear  distended. MSK:  Left hip: Normal-appearing. Decreased rotational range of motion in flexion.  Pain mostly located at lateral hip with these motions. Hip abduction strength significantly diminished 3+/5.  External rotation diminished 4/5.  Both produce pain. Internal rotation and adduction normal 5/5 no pain.  Right hip normal-appearing normal motion normal strength. Significant antalgic gait.  Lab and Radiology Results DG Hip Unilat W OR W/O Pelvis 2-3 Views Left  Result Date: 05/14/2019 CLINICAL DATA:  Acute left hip pain for the past 2 weeks. No known injury. EXAM: DG HIP (WITH OR WITHOUT PELVIS) 2-3V LEFT COMPARISON:  None. FINDINGS: No fracture or dislocation. Moderate degenerative change the left hip with joint space loss, subchondral sclerosis and osteophytosis. No evidence of avascular necrosis. Limited visualization of the pelvis is normal. Mild degenerative changes suspected within the contralateral right hip, incompletely evaluated. Phleboliths overlie the lower pelvis bilaterally. Regional soft tissues appear normal. IMPRESSION: 1. No acute findings. 2. Moderate degenerative change the left hip. Electronically Signed   By: Sandi Mariscal M.D.   On: 05/14/2019 07:35   I, [name], personally (independently) visualized and performed the interpretation of the images attached in this note.  Hip greater trochanteric injection: left Consent obtained and timeout performed. Area of maximum tenderness palpated and identified. Skin cleaned with alcohol, cold spray applied. A 22g needle was used to access the greater trochanteric bursa. 40 mg of kenalog and 4 mL of Marcaine were used to inject the trochanteric bursa. Patient tolerated the procedure well.   Impression and Recommendations:    Assessment and  Plan: 59 y.o. male with left lateral hip pain due to trochanteric bursitis/hip abductor tendinopathy.  Plan for physical therapy home exercise program and injection as above.  Limited  hydrocodone for pain control.  Cautioned against driving with hydrocodone.  Work note provided.  Happy to fill out any forms if needed. Check back in 1 month or sooner if needed.Marland Kitchen  PDMP reviewed during this encounter. Orders Placed This Encounter  Procedures  . Ambulatory referral to Physical Therapy    Referral Priority:   Routine    Referral Type:   Physical Medicine    Referral Reason:   Specialty Services Required    Requested Specialty:   Physical Therapy   Meds ordered this encounter  Medications  . HYDROcodone-acetaminophen (NORCO/VICODIN) 5-325 MG tablet    Sig: Take 1 tablet by mouth every 6 (six) hours as needed.    Dispense:  15 tablet    Refill:  0    Discussed warning signs or symptoms. Please see discharge instructions. Patient expresses understanding.   The above documentation has been reviewed and is accurate and complete Clementeen Graham

## 2019-06-04 ENCOUNTER — Other Ambulatory Visit: Payer: Self-pay

## 2019-06-04 ENCOUNTER — Ambulatory Visit: Payer: PRIVATE HEALTH INSURANCE | Attending: Family Medicine

## 2019-06-04 DIAGNOSIS — R262 Difficulty in walking, not elsewhere classified: Secondary | ICD-10-CM | POA: Diagnosis present

## 2019-06-04 DIAGNOSIS — M25552 Pain in left hip: Secondary | ICD-10-CM | POA: Diagnosis present

## 2019-06-04 DIAGNOSIS — M6281 Muscle weakness (generalized): Secondary | ICD-10-CM | POA: Insufficient documentation

## 2019-06-04 DIAGNOSIS — M25652 Stiffness of left hip, not elsewhere classified: Secondary | ICD-10-CM
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
## 2019-06-11 ENCOUNTER — Ambulatory Visit: Payer: PRIVATE HEALTH INSURANCE

## 2019-06-18 ENCOUNTER — Encounter: Payer: Self-pay | Admitting: Physical Therapy

## 2019-06-18 ENCOUNTER — Ambulatory Visit: Payer: PRIVATE HEALTH INSURANCE | Admitting: Physical Therapy

## 2019-06-18 ENCOUNTER — Other Ambulatory Visit: Payer: Self-pay

## 2019-06-18 ENCOUNTER — Encounter: Payer: Self-pay | Admitting: Family Medicine

## 2019-06-18 ENCOUNTER — Ambulatory Visit (INDEPENDENT_AMBULATORY_CARE_PROVIDER_SITE_OTHER): Payer: PRIVATE HEALTH INSURANCE | Admitting: Family Medicine

## 2019-06-18 VITALS — BP 120/80 | HR 65 | Ht 71.0 in | Wt 185.2 lb

## 2019-06-18 DIAGNOSIS — M7062 Trochanteric bursitis, left hip: Secondary | ICD-10-CM | POA: Diagnosis not present

## 2019-06-18 DIAGNOSIS — M25552 Pain in left hip: Secondary | ICD-10-CM | POA: Diagnosis not present

## 2019-06-18 DIAGNOSIS — R262 Difficulty in walking, not elsewhere classified: Secondary | ICD-10-CM

## 2019-06-18 DIAGNOSIS — M6281 Muscle weakness (generalized): Secondary | ICD-10-CM

## 2019-06-18 DIAGNOSIS — M25652 Stiffness of left hip, not elsewhere classified: Secondary | ICD-10-CM
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
## 2019-06-18 NOTE — Progress Notes (Signed)
I, Randy Park, LAT, ATC, am serving as scribe for Dr. Lynne Park.  Randy Park is a 59 y.o. male who presents to [place] at [facility] today for f/u of L lateral hip, buttock and thigh pain.  He was last seen by Dr. Georgina Snell on 05/21/19 and was prescribed hydrocodone-acetaminophen and referred to outpatient PT of which he completed 2 visits.  Since his last visit, pt reports that his L lateral hip feels about the same.  He has been completing his HEP as prescribed by PT.  He has run out of his hydrocodone but states that it didn't help much.  He also reports little relief from his GT injection performed at his last visit.  Pain has been ongoing for greater than 6 weeks.  At this point he has had greater than 6 weeks of physician directed conservative management with little benefit.  Diagnostic imaging: L hip XR- 05/11/19   Pertinent review of systems: No fevers or chills  Relevant historical information: History diverticulitis with perforation and colostomy   Exam:  BP 120/80 (BP Location: Left Arm, Patient Position: Sitting, Cuff Size: Large)   Pulse 65   Ht 5\' 11"  (1.803 m)   Wt 185 lb 3.2 oz (84 kg)   SpO2 98%   BMI 25.83 kg/m  General: Well Developed, well nourished, and in no acute distress.   MSK:  Left hip: Normal-appearing mild antalgic gait.    Lab and Radiology Results DG Hip Unilat W OR W/O Pelvis 2-3 Views Left  Result Date: 05/14/2019 CLINICAL DATA:  Acute left hip pain for the past 2 weeks. No known injury. EXAM: DG HIP (WITH OR WITHOUT PELVIS) 2-3V LEFT COMPARISON:  None. FINDINGS: No fracture or dislocation. Moderate degenerative change the left hip with joint space loss, subchondral sclerosis and osteophytosis. No evidence of avascular necrosis. Limited visualization of the pelvis is normal. Mild degenerative changes suspected within the contralateral right hip, incompletely evaluated. Phleboliths overlie the lower pelvis bilaterally.  Regional soft tissues appear normal. IMPRESSION: 1. No acute findings. 2. Moderate degenerative change the left hip. Electronically Signed   By: Sandi Mariscal M.D.   On: 05/14/2019 07:35   I, Randy Park, personally (independently) visualized and performed the interpretation of the images attached in this note.     Assessment and Plan: 59 y.o. male with left lateral hip pain thought to be due to trochanteric bursitis/hip abductor tendinopathy.  Patient does have degenerative changes seen on x-ray from February 5 but does not have significant anterior hip pain.  At this point he has had 2 sessions with physical therapy injection and trial of conservative home exercise program with little benefit.  Plan for MRI to further characterize source of pain and direct further management.   PDMP not reviewed this encounter. Orders Placed This Encounter  Procedures  . MR HIP LEFT WO CONTRAST    Standing Status:   Future    Standing Expiration Date:   08/17/2020    Order Specific Question:   ** REASON FOR EXAM (FREE TEXT)    Answer:   suspect troch bursitis/tendonitis    Order Specific Question:   What is the patient's sedation requirement?    Answer:   No Sedation    Order Specific Question:   Does the patient have a pacemaker or implanted devices?    Answer:   No    Order Specific Question:   Preferred imaging location?    Answer:   [facility] (  table limit-350lbs)    Order Specific Question:   Radiology Contrast Protocol - do NOT remove file path    Answer:   [email or web address]   No orders of the defined types were placed in this encounter.    Discussed warning signs or symptoms. Please see discharge instructions. Patient expresses understanding.   The above documentation has been reviewed and is accurate and complete Clementeen Graham

## 2019-06-18 NOTE — Patient Instructions (Signed)
Thank you for coming in today. Plan for MRI hip.  Recheck with me following MRI.  Phone number to imaging is [phone].  Let me know if you do not hear about scheduling by the end of th week.

## 2019-06-25 ENCOUNTER — Ambulatory Visit: Payer: PRIVATE HEALTH INSURANCE

## 2019-07-07 ENCOUNTER — Ambulatory Visit (INDEPENDENT_AMBULATORY_CARE_PROVIDER_SITE_OTHER): Payer: PRIVATE HEALTH INSURANCE

## 2019-07-07 ENCOUNTER — Other Ambulatory Visit: Payer: Self-pay

## 2019-07-07 DIAGNOSIS — M25552 Pain in left hip: Secondary | ICD-10-CM | POA: Diagnosis not present

## 2019-07-07 DIAGNOSIS — M7062 Trochanteric bursitis, left hip: Secondary | ICD-10-CM

## 2019-07-09 NOTE — Progress Notes (Signed)
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  Additionally shows labrum tear.  Please return to clinic to discuss the results in full detail.

## 2019-07-16 ENCOUNTER — Encounter: Payer: Self-pay | Admitting: Family Medicine

## 2019-07-16 ENCOUNTER — Other Ambulatory Visit: Payer: Self-pay

## 2019-07-16 ENCOUNTER — Ambulatory Visit (INDEPENDENT_AMBULATORY_CARE_PROVIDER_SITE_OTHER): Payer: PRIVATE HEALTH INSURANCE | Admitting: Family Medicine

## 2019-07-16 VITALS — BP 110/80 | HR 75 | Ht 71.0 in | Wt 187.0 lb

## 2019-07-16 DIAGNOSIS — M25552 Pain in left hip: Secondary | ICD-10-CM | POA: Diagnosis not present

## 2019-07-16 NOTE — Patient Instructions (Signed)
Thank you for coming in today.  Keep working on the home exercises.  Keep me updated and recheck as needed.

## 2019-07-16 NOTE — Progress Notes (Signed)
Randy Park, am serving as a Education administrator for Dr. [name].  Randy Park is a 59 y.o. male who presents to [place] at [facility] today for f/u of L hip pain and L hip MRI review.  He was last seen by Dr. Georgina Snell on 06/18/19.  He had a L GT injection on 05/21/19, has been provided w/ a HEP and was referred to outpatient PT of which he has completed 2 visits.  Since his last visit, pt reports he is feeling about 75% better    Diagnostic testing: L hip XR- 05/11/19; L hip MRI- 07/07/19   Pertinent review of systems: No fevers or chills  Relevant historical information: Colostomy status   Exam:  BP 110/80 (BP Location: Left Arm, Patient Position: Sitting, Cuff Size: Normal)   Pulse 75   Ht 5\' 11"  (1.803 m)   Wt 187 lb (84.8 kg)   SpO2 99%   BMI 26.08 kg/m  General: Well Developed, well nourished, and in no acute distress.   MSK: Left hip normal-appearing not particular tender to palpation. Normal gait.    Lab and Radiology Results  EXAM: MR OF THE LEFT HIP WITHOUT CONTRAST  TECHNIQUE: Multiplanar, multisequence MR imaging was performed. No intravenous contrast was administered.  COMPARISON:  Radiographs 05/11/2019  FINDINGS: Both hips are normally located. Moderate to advanced left hip joint degenerative changes for age with joint space narrowing, spurring and subchondral cystic change. The right hip demonstrates moderate degenerative changes. No stress fracture or AVN.  The superior labrum on the left is torn. No discrete paralabral cyst. There is a small hip joint effusion.  The pubic symphysis and SI joints are intact. No pelvic fractures or bone lesions.  Mild peritendinitis but no findings for trochanteric bursitis. The surrounding hip and pelvic musculature appear grossly normal. No muscle tear, myositis or mass. The hamstring tendons are intact.  No significant intrapelvic abnormalities are identified. Mild prostate gland  enlargement.  IMPRESSION: 1. 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 2. Degenerated and torn left superior labrum. 3. No stress fracture or AVN. 4. Mild peritendinitis but no findings for trochanteric bursitis.   Electronically Signed   By: Marijo Sanes M.D.   On: 07/07/2019 15:34  I, [name], personally (independently) visualized and performed the interpretation of the images attached in this note.     Assessment and Plan: 59 y.o. male with left lateral hip pain.  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  He does have a bit of peritendinitis which is probably the pain that he has been feeling at the lateral hip.  It is possible that he has an unusual presentation of intra-articular hip pain more felt at the lateral help.  Regardless he has had some spontaneous improvement.  He is about 75% better.  We discussed his MRI findings in detail and what neck steps would be.  If needed would recommend diagnostic and hopefully therapeutic intra-articular hip injection.  Regardless given his initial improvement will continue home exercise program and watchful waiting.  Recheck back as needed.   PDMP not reviewed this encounter. No orders of the defined types were placed in this encounter.  No orders of the defined types were placed in this encounter.    Discussed warning signs or symptoms. Please see discharge instructions. Patient expresses understanding.   The above documentation has been reviewed and is accurate and complete [name]   Total encounter time 20  minutes including charting time date of service. Discussed MRI findings and treatment plan

## 2020-07-25 ENCOUNTER — Encounter: Payer: Self-pay | Admitting: Family Medicine

## 2020-07-25 ENCOUNTER — Other Ambulatory Visit: Payer: Self-pay

## 2020-07-25 ENCOUNTER — Ambulatory Visit (INDEPENDENT_AMBULATORY_CARE_PROVIDER_SITE_OTHER): Payer: 59 | Admitting: Family Medicine

## 2020-07-25 ENCOUNTER — Telehealth: Payer: Self-pay | Admitting: Family Medicine

## 2020-07-25 VITALS — BP 132/76 | HR 62 | Temp 97.4°F | Ht 71.0 in | Wt 188.0 lb

## 2020-07-25 DIAGNOSIS — R519 Headache, unspecified: Secondary | ICD-10-CM | POA: Diagnosis not present

## 2020-07-25 DIAGNOSIS — I739 Peripheral vascular disease, unspecified: Secondary | ICD-10-CM | POA: Diagnosis not present

## 2020-07-25 DIAGNOSIS — Z0000 Encounter for general adult medical examination without abnormal findings: Secondary | ICD-10-CM | POA: Diagnosis not present

## 2020-07-25 DIAGNOSIS — Z720 Tobacco use: Secondary | ICD-10-CM | POA: Diagnosis not present

## 2020-07-25 MED ORDER — VARENICLINE TARTRATE 0.5 MG PO TABS: 0.5000 mg | ORAL_TABLET | Freq: Two times a day (BID) | ORAL | 1 refills | Status: DC
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
## 2020-07-25 NOTE — Telephone Encounter (Signed)
Patient wants to know if he can get a generic version on Chantix because the name brand is too expensive. He also wants it sent to [facility] on [address] instead of [facility]. Please call him back at [phone] if you have any questions.

## 2020-07-25 NOTE — Patient Instructions (Addendum)
Health Maintenance, Male Adopting a healthy lifestyle and getting preventive care are important in promoting health and wellness. Ask your health care provider about:  The right schedule for you to have regular tests and exams.  Things you can do on your own to prevent diseases and keep yourself healthy. What should I know about diet, weight, and exercise? Eat a healthy diet  Eat a diet that includes plenty of vegetables, fruits, low-fat dairy products, and lean protein.  Do not eat a lot of foods that are high in solid fats, added sugars, or sodium.   Maintain a healthy weight Body mass index (BMI) is a measurement that can be used to identify possible weight problems. It estimates body fat based on height and weight. Your health care provider can help determine your BMI and help you achieve or maintain a healthy weight. Get regular exercise Get regular exercise. This is one of the most important things you can do for your health. Most adults should:  Exercise for at least 150 minutes each week. The exercise should increase your heart rate and make you sweat (moderate-intensity exercise).  Do strengthening exercises at least twice a week. This is in addition to the moderate-intensity exercise.  Spend less time sitting. Even light physical activity can be beneficial. Watch cholesterol and blood lipids Have your blood tested for lipids and cholesterol at 58 years of age, then have this test every 5 years. You may need to have your cholesterol levels checked more often if:  Your lipid or cholesterol levels are high.  You are older than 58 years of age.  You are at high risk for heart disease. What should I know about cancer screening? Many types of cancers can be detected early and may often be prevented. Depending on your health history and family history, you may need to have cancer screening at various ages. This may include screening for:  Colorectal cancer.  Prostate  cancer.  Skin cancer.  Lung cancer. What should I know about heart disease, diabetes, and high blood pressure? Blood pressure and heart disease  High blood pressure causes heart disease and increases the risk of stroke. This is more likely to develop in people who have high blood pressure readings, are of African descent, or are overweight.  Talk with your health care provider about your target blood pressure readings.  Have your blood pressure checked: ? Every 3-5 years if you are 18-39 years of age. ? Every year if you are 40 years old or older.  If you are between the ages of 65 and 75 and are a current or former smoker, ask your health care provider if you should have a one-time screening for abdominal aortic aneurysm (AAA). Diabetes Have regular diabetes screenings. This checks your fasting blood sugar level. Have the screening done:  Once every three years after age 45 if you are at a normal weight and have a low risk for diabetes.  More often and at a younger age if you are overweight or have a high risk for diabetes. What should I know about preventing infection? Hepatitis B If you have a higher risk for hepatitis B, you should be screened for this virus. Talk with your health care provider to find out if you are at risk for hepatitis B infection. Hepatitis C Blood testing is recommended for:  Everyone born from 1945 through 1965.  Anyone with known risk factors for hepatitis C. Sexually transmitted infections (STIs)  You should be screened each   year for STIs, including gonorrhea and chlamydia, if: ? You are sexually active and are younger than 58 years of age. ? You are older than 58 years of age and your health care provider tells you that you are at risk for this type of infection. ? Your sexual activity has changed since you were last screened, and you are at increased risk for chlamydia or gonorrhea. Ask your health care provider if you are at risk.  Ask your  health care provider about whether you are at high risk for HIV. Your health care provider may recommend a prescription medicine to help prevent HIV infection. If you choose to take medicine to prevent HIV, you should first get tested for HIV. You should then be tested every 3 months for as long as you are taking the medicine. Follow these instructions at home: Lifestyle  Do not use any products that contain nicotine or tobacco, such as cigarettes, e-cigarettes, and chewing tobacco. If you need help quitting, ask your health care provider.  Do not use street drugs.  Do not share needles.  Ask your health care provider for help if you need support or information about quitting drugs. Alcohol use  Do not drink alcohol if your health care provider tells you not to drink.  If you drink alcohol: ? Limit how much you have to 0-2 drinks a day. ? Be aware of how much alcohol is in your drink. In the [place] one drink equals one 12 oz bottle of beer (355 mL), one 5 oz glass of wine (148 mL), or one 1 oz glass of hard liquor (44 mL). General instructions  Schedule regular health, dental, and eye exams.  Stay current with your vaccines.  Tell your health care provider if: ? You often feel depressed. ? You have ever been abused or do not feel safe at home. Summary  Adopting a healthy lifestyle and getting preventive care are important in promoting health and wellness.  Follow your health care provider's instructions about healthy diet, exercising, and getting tested or screened for diseases.  Follow your health care provider's instructions on monitoring your cholesterol and blood pressure. This information is not intended to replace advice given to you by your health care provider. Make sure you discuss any questions you have with your health care provider. Document Revised: 03/15/2018 Document Reviewed: 03/15/2018 Elsevier Patient Education  2021 [place] Years  Old, Male Preventive care refers to lifestyle choices and visits with your health care provider that can promote health and wellness. This includes:  A yearly physical exam. This is also called an annual wellness visit.  Regular dental and eye exams.  Immunizations.  Screening for certain conditions.  Healthy lifestyle choices, such as: ? Eating a healthy diet. ? Getting regular exercise. ? Not using drugs or products that contain nicotine and tobacco. ? Limiting alcohol use. What can I expect for my preventive care visit? Physical exam Your health care provider will check your:  Height and weight. These may be used to calculate your BMI (body mass index). BMI is a measurement that tells if you are at a healthy weight.  Heart rate and blood pressure.  Body temperature.  Skin for abnormal spots. Counseling Your health care provider may ask you questions about your:  Past medical problems.  Family's medical history.  Alcohol, tobacco, and drug use.  Emotional well-being.  Home life and relationship well-being.  Sexual activity.  Diet, exercise, and sleep habits.  Work and work Statistician.  Access to firearms. What immunizations do I need? Vaccines are usually given at various ages, according to a schedule. Your health care provider will recommend vaccines for you based on your age, medical history, and lifestyle or other factors, such as travel or where you work.   What tests do I need? Blood tests  Lipid and cholesterol levels. These may be checked every 5 years, or more often if you are over 44 years old.  Hepatitis C test.  Hepatitis B test. Screening  Lung cancer screening. You may have this screening every year starting at age 77 if you have a 30-pack-year history of smoking and currently smoke or have quit within the past 15 years.  Prostate cancer screening. Recommendations will vary depending on your family history and other risks.  Genital exam  to check for testicular cancer or hernias.  Colorectal cancer screening. ? All adults should have this screening starting at age 36 and continuing until age 28. ? Your health care provider may recommend screening at age 16 if you are at increased risk. ? You will have tests every 1-10 years, depending on your results and the type of screening test.  Diabetes screening. ? This is done by checking your blood sugar (glucose) after you have not eaten for a while (fasting). ? You may have this done every 1-3 years.  STD (sexually transmitted disease) testing, if you are at risk. Follow these instructions at home: Eating and drinking  Eat a diet that includes fresh fruits and vegetables, whole grains, lean protein, and low-fat dairy products.  Take vitamin and mineral supplements as recommended by your health care provider.  Do not drink alcohol if your health care provider tells you not to drink.  If you drink alcohol: ? Limit how much you have to 0-2 drinks a day. ? Be aware of how much alcohol is in your drink. In the [place] one drink equals one 12 oz bottle of beer (355 mL), one 5 oz glass of wine (148 mL), or one 1 oz glass of hard liquor (44 mL).   Lifestyle  Take daily care of your teeth and gums. Brush your teeth every morning and night with fluoride toothpaste. Floss one time each day.  Stay active. Exercise for at least 30 minutes 5 or more days each week.  Do not use any products that contain nicotine or tobacco, such as cigarettes, e-cigarettes, and chewing tobacco. If you need help quitting, ask your health care provider.  Do not use drugs.  If you are sexually active, practice safe sex. Use a condom or other form of protection to prevent STIs (sexually transmitted infections).  If told by your health care provider, take low-dose aspirin daily starting at age 10.  Find healthy ways to cope with stress, such as: ? Meditation, yoga, or listening to  music. ? Journaling. ? Talking to a trusted person. ? Spending time with friends and family. Safety  Always wear your seat belt while driving or riding in a vehicle.  Do not drive: ? If you have been drinking alcohol. Do not ride with someone who has been drinking. ? When you are tired or distracted. ? While texting.  Wear a helmet and other protective equipment during sports activities.  If you have firearms in your house, make sure you follow all gun safety procedures. What's next?  Go to your health care provider once a year for an annual wellness visit.  Ask your health  care provider how often you should have your eyes and teeth checked.  Stay up to date on all vaccines. This information is not intended to replace advice given to you by your health care provider. Make sure you discuss any questions you have with your health care provider. Document Revised: 12/19/2018 Document Reviewed: 03/16/2018 Elsevier Patient Education  2021 Else[employer] Inc.  Steps to Quit Smoking Smoking tobacco is the leading cause of preventable death. It can affect almost every organ in the body. Smoking puts you and people around you at risk for many serious, long-lasting (chronic) diseases. Quitting smoking can be hard, but it is one of the best things that you can do for your health. It is never too late to quit. How do I get ready to quit? When you decide to quit smoking, make a plan to help you succeed. Before you quit:  Pick a date to quit. Set a date within the next 2 weeks to give you time to prepare.  Write down the reasons why you are quitting. Keep this list in places where you will see it often.  Tell your family, friends, and co-workers that you are quitting. Their support is important.  Talk with your doctor about the choices that may help you quit.  Find out if your health insurance will pay for these treatments.  Know the people, places, things, and activities that make you want to  smoke (triggers). Avoid them. What first steps can I take to quit smoking?  Throw away all cigarettes at home, at work, and in your car.  Throw away the things that you use when you smoke, such as ashtrays and lighters.  Clean your car. Make sure to empty the ashtray.  Clean your home, including curtains and carpets. What can I do to help me quit smoking? Talk with your doctor about taking medicines and seeing a counselor at the same time. You are more likely to succeed when you do both.  If you are pregnant or breastfeeding, talk with your doctor about counseling or other ways to quit smoking. Do not take medicine to help you quit smoking unless your doctor tells you to do so. To quit smoking: Quit right away  Quit smoking totally, instead of slowly cutting back on how much you smoke over a period of time.  Go to counseling. You are more likely to quit if you go to counseling sessions regularly. Take medicine You may take medicines to help you quit. Some medicines need a prescription, and some you can buy over-the-counter. Some medicines may contain a drug called nicotine to replace the nicotine in cigarettes. Medicines may:  Help you to stop having the desire to smoke (cravings).  Help to stop the problems that come when you stop smoking (withdrawal symptoms). Your doctor may ask you to use:  Nicotine patches, gum, or lozenges.  Nicotine inhalers or sprays.  Non-nicotine medicine that is taken by mouth. Find resources Find resources and other ways to help you quit smoking and remain smoke-free after you quit. These resources are most helpful when you use them often. They include:  Online chats with a Veterinary surgeon.  Phone quitlines.  Printed Materials engineer.  Support groups or group counseling.  Text messaging programs.  Mobile phone apps. Use apps on your mobile phone or tablet that can help you stick to your quit plan. There are many free apps for mobile phones and  tablets as well as websites. Examples include Quit Guide from the [employer]  and smokefree.gov   What things can I do to make it easier to quit?  Talk to your family and friends. Ask them to support and encourage you.  Call a phone quitline (1-800-QUIT-NOW), reach out to support groups, or work with a counselor.  Ask people who smoke to not smoke around you.  Avoid places that make you want to smoke, such as: ? Bars. ? Parties. ? Smoke-break areas at work.  Spend time with people who do not smoke.  Lower the stress in your life. Stress can make you want to smoke. Try these things to help your stress: ? Getting regular exercise. ? Doing deep-breathing exercises. ? Doing yoga. ? Meditating. ? Doing a body scan. To do this, close your eyes, focus on one area of your body at a time from head to toe. Notice which parts of your body are tense. Try to relax the muscles in those areas.   How will I feel when I quit smoking? Day 1 to 3 weeks Within the first 24 hours, you may start to have some problems that come from quitting tobacco. These problems are very bad 2-3 days after you quit, but they do not often last for more than 2-3 weeks. You may get these symptoms:  Mood swings.  Feeling restless, nervous, angry, or annoyed.  Trouble concentrating.  Dizziness.  Strong desire for high-sugar foods and nicotine.  Weight gain.  Trouble pooping (constipation).  Feeling like you may vomit (nausea).  Coughing or a sore throat.  Changes in how the medicines that you take for other issues work in your body.  Depression.  Trouble sleeping (insomnia). Week 3 and afterward After the first 2-3 weeks of quitting, you may start to notice more positive results, such as:  Better sense of smell and taste.  Less coughing and sore throat.  Slower heart rate.  Lower blood pressure.  Clearer skin.  Better breathing.  Fewer sick days. Quitting smoking can be hard. Do not give up if you  fail the first time. Some people need to try a few times before they succeed. Do your best to stick to your quit plan, and talk with your doctor if you have any questions or concerns. Summary  Smoking tobacco is the leading cause of preventable death. Quitting smoking can be hard, but it is one of the best things that you can do for your health.  When you decide to quit smoking, make a plan to help you succeed.  Quit smoking right away, not slowly over a period of time.  When you start quitting, seek help from your doctor, family, or friends. This information is not intended to replace advice given to you by your health care provider. Make sure you discuss any questions you have with your health care provider. Document Revised: 12/15/2018 Document Reviewed: 06/10/2018 Elsevier Patient Education  2021 Elsevier Inc. Varenicline oral tablets What is this medicine? VARENICLINE (var e NI kleen) is used to help people quit smoking. It is used with a patient support program recommended by your physician. This medicine may be used for other purposes; ask your health care provider or pharmacist if you have questions. COMMON BRAND NAME(S): Chantix What should I tell my health care provider before I take this medicine? They need to know if you have any of these conditions:  heart disease  if you often drink alcohol  kidney disease  mental illness  on hemodialysis  seizures  history of stroke  suicidal thoughts, plans,   or attempt; a previous suicide attempt by you or a family member  an unusual or allergic reaction to varenicline, other medicines, foods, dyes, or preservatives  pregnant or trying to get pregnant  breast-feeding How should I use this medicine? Take this medicine by mouth after eating. Take with a full glass of water. Follow the directions on the prescription label. Take your doses at regular intervals. Do not take your medicine more often than directed. There are 3  ways you can use this medicine to help you quit smoking; talk to your health care professional to decide which plan is right for you: 1) you can choose a quit date and start this medicine 1 week before the quit date, or, 2) you can start taking this medicine before you choose a quit date, and then pick a quit date between day 8 and 35 days of treatment, or, 3) if you are not sure that you are able or willing to quit smoking right away, start taking this medicine and slowly decrease the amount you smoke as directed by your health care professional with the goal of being cigarette-free by week 12 of treatment. Stick to your plan; ask about support groups or other ways to help you remain cigarette-free. If you are motivated to quit smoking and did not succeed during a previous attempt with this medicine for reasons other than side effects, or if you returned to smoking after this treatment, speak with your health care professional about whether another course of this medicine may be right for you. A special MedGuide will be given to you by the pharmacist with each prescription and refill. Be sure to read this information carefully each time. Talk to your pediatrician regarding the use of this medicine in children. This medicine is not approved for use in children. Overdosage: If you think you have taken too much of this medicine contact a poison control center or emergency room at once. NOTE: This medicine is only for you. Do not share this medicine with others. What if I miss a dose? If you miss a dose, take it as soon as you can. If it is almost time for your next dose, take only that dose. Do not take double or extra doses. What may interact with this medicine?  alcohol  insulin  other medicines used to help people quit smoking  theophylline  warfarin This list may not describe all possible interactions. Give your health care provider a list of all the medicines, herbs, non-prescription drugs,  or dietary supplements you use. Also tell them if you smoke, drink alcohol, or use illegal drugs. Some items may interact with your medicine. What should I watch for while using this medicine? It is okay if you do not succeed at your attempt to quit and have a cigarette. You can still continue your quit attempt and keep using this medicine as directed. Just throw away your cigarettes and get back to your quit plan. Talk to your health care provider before using other treatments to quit smoking. Using this medicine with other treatments to quit smoking may increase the risk for side effects compared to using a treatment alone. You may get drowsy or dizzy. Do not drive, use machinery, or do anything that needs mental alertness until you know how this medicine affects you. Do not stand or sit up quickly, especially if you are an older patient. This reduces the risk of dizzy or fainting spells. Decrease the number of alcoholic beverages that you drink  during treatment with this medicine until you know if this medicine affects your ability to tolerate alcohol. Some people have experienced increased drunkenness (intoxication), unusual or sometimes aggressive behavior, or no memory of things that have happened (amnesia) during treatment with this medicine. Sleepwalking can happen during treatment with this medicine, and can sometimes lead to behavior that is harmful to you, other people, or property. Stop taking this medicine and tell your doctor if you start sleepwalking or have other unusual sleep-related activity. After taking this medicine, you may get up out of bed and do an activity that you do not know you are doing. The next morning, you may have no memory of this. Activities include driving a car ("sleep-driving"), making and eating food, talking on the phone, sexual activity, and sleep-walking. Serious injuries have occurred. Stop the medicine and call your doctor right away if you find out you have done  any of these activities. Do not take this medicine if you have used alcohol that evening. Do not take it if you have taken another medicine for sleep. The risk of doing these sleep-related activities is higher. Patients and their families should watch out for new or worsening depression or thoughts of suicide. Also watch out for sudden changes in feelings such as feeling anxious, agitated, panicky, irritable, hostile, aggressive, impulsive, severely restless, overly excited and hyperactive, or not being able to sleep. If this happens, call your health care professional. If you have diabetes and you quit smoking, the effects of insulin may be increased and you may need to reduce your insulin dose. Check with your doctor or health care professional about how you should adjust your insulin dose. What side effects may I notice from receiving this medicine? Side effects that you should report to your doctor or health care professional as soon as possible:  allergic reactions like skin rash, itching or hives, swelling of the face, lips, tongue, or throat  acting aggressive, being angry or violent, or acting on dangerous impulses  breathing problems  changes in emotions or moods  chest pain or chest tightness  feeling faint or lightheaded, falls  hallucination, loss of contact with reality  mouth sores  redness, blistering, peeling or loosening of the skin, including inside the mouth  signs and symptoms of a stroke like changes in vision; confusion; trouble speaking or understanding; severe headaches; sudden numbness or weakness of the face, arm or leg; trouble walking; dizziness; loss of balance or coordination  seizures  sleepwalking  suicidal thoughts or other mood changes Side effects that usually do not require medical attention (report to your doctor or health care professional if they continue or are bothersome):  constipation  gas  headache  nausea, vomiting  strange  dreams  trouble sleeping This list may not describe all possible side effects. Call your doctor for medical advice about side effects. You may report side effects to FDA at 1-800-FDA-1088. Where should I keep my medicine? Keep out of the reach of children. Store at room temperature between 15 and 30 degrees C (59 and 86 degrees F). Throw away any unused medicine after the expiration date. NOTE: This sheet is a summary. It may not cover all possible information. If you have questions about this medicine, talk to your doctor, pharmacist, or health care provider.  2021 Elsevier/Gold Standard (2018-03-10 14:27:36)

## 2020-07-26 LAB — COMPREHENSIVE METABOLIC PANEL
AG Ratio: 1.6 (calc) (ref 1.0–2.5)
ALT: 11 U/L (ref 9–46)
AST: 14 U/L (ref 10–35)
Albumin: 4.3 g/dL (ref 3.6–5.1)
Alkaline phosphatase (APISO): 75 U/L (ref 35–144)
BUN: 15 mg/dL (ref 7–25)
CO2: 25 mmol/L (ref 20–32)
Calcium: 9.7 mg/dL (ref 8.6–10.3)
Chloride: 106 mmol/L (ref 98–110)
Creat: 1.29 mg/dL (ref 0.70–1.33)
Globulin: 2.7 g/dL (calc) (ref 1.9–3.7)
Glucose, Bld: 86 mg/dL (ref 65–99)
Potassium: 5.1 mmol/L (ref 3.5–5.3)
Sodium: 138 mmol/L (ref 135–146)
Total Bilirubin: 0.7 mg/dL (ref 0.2–1.2)
Total Protein: 7 g/dL (ref 6.1–8.1)

## 2020-07-26 LAB — URINALYSIS, ROUTINE W REFLEX MICROSCOPIC
Bilirubin Urine: NEGATIVE
Glucose, UA: NEGATIVE
Hgb urine dipstick: NEGATIVE
Ketones, ur: NEGATIVE
Leukocytes,Ua: NEGATIVE
Nitrite: NEGATIVE
Protein, ur: NEGATIVE
Specific Gravity, Urine: 1.012 (ref 1.001–1.035)
pH: <=5 (ref 5.0–8.0)

## 2020-07-26 LAB — CBC
HCT: 46.6 % (ref 38.5–50.0)
Hemoglobin: 15.5 g/dL (ref 13.2–17.1)
MCH: 31.2 pg (ref 27.0–33.0)
MCHC: 33.3 g/dL (ref 32.0–36.0)
MCV: 93.8 fL (ref 80.0–100.0)
MPV: 10.3 fL (ref 7.5–12.5)
Platelets: 308 10*3/uL (ref 140–400)
RBC: 4.97 10*6/uL (ref 4.20–5.80)
RDW: 11.8 % (ref 11.0–15.0)
WBC: 6.7 10*3/uL (ref 3.8–10.8)

## 2020-07-26 LAB — LIPID PANEL
Cholesterol: 179 mg/dL (ref <200)
HDL: 48 mg/dL (ref >=40)
LDL Cholesterol (Calc): 109 mg/dL (calc) — ABNORMAL HIGH
Non-HDL Cholesterol (Calc): 131 mg/dL (calc) — ABNORMAL HIGH (ref <130)
Total CHOL/HDL Ratio: 3.7 (calc) (ref <5.0)
Triglycerides: 111 mg/dL (ref <150)

## 2020-07-26 LAB — PSA: PSA: 2.43 ng/mL (ref <=4.00)

## 2020-07-28 MED ORDER — VARENICLINE TARTRATE 0.5 MG PO TABS: 0.5000 mg | ORAL_TABLET | Freq: Two times a day (BID) | ORAL | 1 refills | Status: DC

## 2020-07-28 NOTE — Telephone Encounter (Signed)
Patient aware that Rx sent in to requested pharmacy.

## 2020-07-28 NOTE — Telephone Encounter (Signed)
Please advise message below,okay to send in generic chantix to different pharmacy?

## 2020-08-05 ENCOUNTER — Other Ambulatory Visit: Payer: Self-pay

## 2020-08-05 ENCOUNTER — Ambulatory Visit (HOSPITAL_BASED_OUTPATIENT_CLINIC_OR_DEPARTMENT_OTHER)
Admission: RE | Admit: 2020-08-05 | Discharge: 2020-08-05 | Disposition: A | Discharge status: Home / Self Care | Payer: 59 | Source: Ambulatory Visit | Attending: Family Medicine | Admitting: Family Medicine

## 2020-08-05 DIAGNOSIS — Z720 Tobacco use: Secondary | ICD-10-CM | POA: Diagnosis not present

## 2020-08-07 MED ORDER — ATORVASTATIN CALCIUM 10 MG PO TABS: 10.0000 mg | ORAL_TABLET | Freq: Every day | ORAL | 3 refills | Status: DC

## 2020-08-07 NOTE — Addendum Note (Signed)
Addended by: Nadene Rubins A on: 08/07/2020 01:10 PM   Modules accepted: Orders

## 2020-08-12 ENCOUNTER — Telehealth: Payer: Self-pay

## 2020-08-12 ENCOUNTER — Telehealth: Payer: Self-pay | Admitting: Family Medicine

## 2020-08-12 NOTE — Telephone Encounter (Signed)
Returned patients call no answer LMTCB 

## 2020-08-12 NOTE — Telephone Encounter (Signed)
Pt is wanting a cb concerning his most recent lab results. Please advise. 

## 2020-08-12 NOTE — Telephone Encounter (Signed)
Pt is asking for a call back regarding a medication sent in, he thinks atorovastatin. He's not sure why he was prescribed. Also, would like results from CT scan.  Thanks

## 2020-08-13 ENCOUNTER — Other Ambulatory Visit: Payer: Self-pay

## 2020-08-13 DIAGNOSIS — I739 Peripheral vascular disease, unspecified: Secondary | ICD-10-CM

## 2020-08-13 MED ORDER — ATORVASTATIN CALCIUM 10 MG PO TABS: 10.0000 mg | ORAL_TABLET | Freq: Every day | ORAL | 3 refills | Status: DC

## 2020-08-13 NOTE — Telephone Encounter (Signed)
Returned patients call went over labs.  

## 2020-10-13 ENCOUNTER — Ambulatory Visit: Payer: 59 | Admitting: Neurology

## 2020-12-14 ENCOUNTER — Other Ambulatory Visit: Payer: Self-pay

## 2020-12-14 ENCOUNTER — Emergency Department (HOSPITAL_COMMUNITY)
Admission: EM | Admit: 2020-12-14 | Discharge: 2020-12-14 | Disposition: A | Discharge status: Home / Self Care | Payer: 59 | Attending: Emergency Medicine | Admitting: Emergency Medicine

## 2020-12-14 DIAGNOSIS — U071 COVID-19: Secondary | ICD-10-CM | POA: Insufficient documentation

## 2020-12-14 DIAGNOSIS — R509 Fever, unspecified: Secondary | ICD-10-CM | POA: Diagnosis present

## 2020-12-14 DIAGNOSIS — F17210 Nicotine dependence, cigarettes, uncomplicated: Secondary | ICD-10-CM | POA: Diagnosis not present

## 2020-12-14 LAB — CBC WITH DIFFERENTIAL/PLATELET
Abs Immature Granulocytes: 0.01 10*3/uL (ref 0.00–0.07)
Basophils Absolute: 0 10*3/uL (ref 0.0–0.1)
Basophils Relative: 1 %
Eosinophils Absolute: 0 10*3/uL (ref 0.0–0.5)
Eosinophils Relative: 1 %
HCT: 44.9 % (ref 39.0–52.0)
Hemoglobin: 15.1 g/dL (ref 13.0–17.0)
Immature Granulocytes: 0 %
Lymphocytes Relative: 11 %
Lymphs Abs: 0.5 10*3/uL — ABNORMAL LOW (ref 0.7–4.0)
MCH: 32.2 pg (ref 26.0–34.0)
MCHC: 33.6 g/dL (ref 30.0–36.0)
MCV: 95.7 fL (ref 80.0–100.0)
Monocytes Absolute: 1.1 10*3/uL — ABNORMAL HIGH (ref 0.1–1.0)
Monocytes Relative: 23 %
Neutro Abs: 3.2 10*3/uL (ref 1.7–7.7)
Neutrophils Relative %: 64 %
Platelets: 244 10*3/uL (ref 150–400)
RBC: 4.69 MIL/uL (ref 4.22–5.81)
RDW: 13.1 % (ref 11.5–15.5)
WBC: 4.9 10*3/uL (ref 4.0–10.5)
nRBC: 0 % (ref 0.0–0.2)

## 2020-12-14 LAB — COMPREHENSIVE METABOLIC PANEL
ALT: 18 U/L (ref 0–44)
AST: 29 U/L (ref 15–41)
Albumin: 4.5 g/dL (ref 3.5–5.0)
Alkaline Phosphatase: 71 U/L (ref 38–126)
Anion gap: 7 (ref 5–15)
BUN: 19 mg/dL (ref 6–20)
CO2: 21 mmol/L — ABNORMAL LOW (ref 22–32)
Calcium: 9.4 mg/dL (ref 8.9–10.3)
Chloride: 111 mmol/L (ref 98–111)
Creatinine, Ser: 1.32 mg/dL — ABNORMAL HIGH (ref 0.61–1.24)
GFR, Estimated: >60 mL/min (ref >60)
Glucose, Bld: 97 mg/dL (ref 70–99)
Potassium: 5.2 mmol/L — ABNORMAL HIGH (ref 3.5–5.1)
Sodium: 139 mmol/L (ref 135–145)
Total Bilirubin: 1.2 mg/dL (ref 0.3–1.2)
Total Protein: 8.4 g/dL — ABNORMAL HIGH (ref 6.5–8.1)

## 2020-12-14 LAB — RESP PANEL BY RT-PCR (FLU A&B, COVID) ARPGX2
Influenza A by PCR: NEGATIVE
Influenza B by PCR: NEGATIVE
SARS Coronavirus 2 by RT PCR: POSITIVE — AB

## 2020-12-14 MED ORDER — ACETAMINOPHEN 325 MG PO TABS
650.0000 mg | ORAL_TABLET | Freq: Once | ORAL | Status: AC
  Administered 2020-12-14: 650 mg via ORAL
  Filled 2020-12-14: qty 2

## 2020-12-14 MED ORDER — ACETAMINOPHEN 325 MG PO TABS
650.0000 mg | ORAL_TABLET | Freq: Once | ORAL | Status: DC
  Filled 2020-12-14: qty 2

## 2020-12-14 MED ORDER — SODIUM CHLORIDE 0.9 % IV BOLUS
500.0000 mL | Freq: Once | INTRAVENOUS | Status: AC
  Administered 2020-12-14: 500 mL via INTRAVENOUS

## 2020-12-14 MED ORDER — METOCLOPRAMIDE HCL 5 MG/ML IJ SOLN
10.0000 mg | Freq: Once | INTRAMUSCULAR | Status: AC
  Administered 2020-12-14: 10 mg via INTRAVENOUS
  Filled 2020-12-14: qty 2

## 2020-12-14 MED ORDER — DIPHENHYDRAMINE HCL 50 MG/ML IJ SOLN
12.5000 mg | Freq: Once | INTRAMUSCULAR | Status: AC
  Administered 2020-12-14: 12.5 mg via INTRAVENOUS
  Filled 2020-12-14: qty 1

## 2020-12-14 NOTE — Discharge Instructions (Signed)
You were seen and evaluated in the emergency for headache and flulike symptoms.  As we discussed, you tested positive for COVID in the department today.  Please drink plenty of fluids, get plenty of rest, take Tylenol/ibuprofen as needed for pain and fever.  You should stay home for the next 5 days.  Please return to the emergency department if you experience new and worsening shortness of breath, worsening fever, new severe cough, or any other concerns you might have.  Please follow-up with your primary care provider within the next week.
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
## 2020-12-14 NOTE — ED Triage Notes (Signed)
Pt c/o fever, chills, body aches, and headache since last night. Reports negative covid test at home

## 2020-12-30 ENCOUNTER — Ambulatory Visit (INDEPENDENT_AMBULATORY_CARE_PROVIDER_SITE_OTHER): Payer: 59 | Admitting: Neurology

## 2020-12-30 ENCOUNTER — Other Ambulatory Visit: Payer: Self-pay

## 2020-12-30 ENCOUNTER — Encounter: Payer: Self-pay | Admitting: Neurology

## 2020-12-30 VITALS — BP 124/78 | HR 67 | Ht 71.0 in | Wt 183.0 lb

## 2020-12-30 DIAGNOSIS — H539 Unspecified visual disturbance: Secondary | ICD-10-CM

## 2020-12-30 DIAGNOSIS — G8929 Other chronic pain: Secondary | ICD-10-CM | POA: Diagnosis not present

## 2020-12-30 DIAGNOSIS — R51 Headache with orthostatic component, not elsewhere classified: Secondary | ICD-10-CM | POA: Diagnosis not present

## 2020-12-30 DIAGNOSIS — R519 Headache, unspecified: Secondary | ICD-10-CM | POA: Diagnosis not present

## 2020-12-30 MED ORDER — RIZATRIPTAN BENZOATE 10 MG PO TBDP: 10.0000 mg | ORAL_TABLET | ORAL | 11 refills | Status: DC | PRN
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
## 2020-12-30 NOTE — Patient Instructions (Addendum)
MRI of the brain w/wo contrast Blood work today At onset of headache try rizatriptan: Please take one tablet at the onset of your headache. If it does not improve the symptoms please take one additional tablet. Do not take more then 2 tablets in 24hrs. Do not take use more then 2 to 3 times in a week.  Migraine Headache A migraine headache is a very strong throbbing pain on one side or both sides of your head. This type of headache can also cause other symptoms. It can last from 4 hours to 3 days. Talk with your doctor about what things may bring on (trigger) this condition. What are the causes? The exact cause of this condition is not known. This condition may be triggered or caused by: Drinking alcohol. Smoking. Taking medicines, such as: Medicine used to treat chest pain (nitroglycerin). Birth control pills. Estrogen. Some blood pressure medicines. Eating or drinking certain products. Doing physical activity. Other things that may trigger a migraine headache include: Having a menstrual period. Pregnancy. Hunger. Stress. Not getting enough sleep or getting too much sleep. Weather changes. Tiredness (fatigue). What increases the risk? Being 32-95 years old. Being male. Having a family history of migraine headaches. Being Caucasian. Having depression or anxiety. Being very overweight. What are the signs or symptoms? A throbbing pain. This pain may: Happen in any area of the head, such as on one side or both sides. Make it hard to do daily activities. Get worse with physical activity. Get worse around bright lights or loud noises. Other symptoms may include: Feeling sick to your stomach (nauseous). Vomiting. Dizziness. Being sensitive to bright lights, loud noises, or smells. Before you get a migraine headache, you may get warning signs (an aura). An aura may include: Seeing flashing lights or having blind spots. Seeing bright spots, halos, or zigzag lines. Having  tunnel vision or blurred vision. Having numbness or a tingling feeling. Having trouble talking. Having weak muscles. Some people have symptoms after a migraine headache (postdromal phase), such as: Tiredness. Trouble thinking (concentrating). How is this treated? Taking medicines that: Relieve pain. Relieve the feeling of being sick to your stomach. Prevent migraine headaches. Treatment may also include: Having acupuncture. Avoiding foods that bring on migraine headaches. Learning ways to control your body functions (biofeedback). Therapy to help you know and deal with negative thoughts (cognitive behavioral therapy). Follow these instructions at home: Medicines Take over-the-counter and prescription medicines only as told by your doctor. Ask your doctor if the medicine prescribed to you: Requires you to avoid driving or using heavy machinery. Can cause trouble pooping (constipation). You may need to take these steps to prevent or treat trouble pooping: Drink enough fluid to keep your pee (urine) pale yellow. Take over-the-counter or prescription medicines. Eat foods that are high in fiber. These include beans, whole grains, and fresh fruits and vegetables. Limit foods that are high in fat and sugar. These include fried or sweet foods. Lifestyle Do not drink alcohol. Do not use any products that contain nicotine or tobacco, such as cigarettes, e-cigarettes, and chewing tobacco. If you need help quitting, ask your doctor. Get at least 8 hours of sleep every night. Limit and deal with stress. General instructions   Keep a journal to find out what may bring on your migraine headaches. For example, write down: What you eat and drink. How much sleep you get. Any change in what you eat or drink. Any change in your medicines. If you have a migraine headache:  Avoid things that make your symptoms worse, such as bright lights. It may help to lie down in a dark, quiet room. Do not  drive or use heavy machinery. Ask your doctor what activities are safe for you. Keep all follow-up visits as told by your doctor. This is important. Contact a doctor if: You get a migraine headache that is different or worse than others you have had. You have more than 15 headache days in one month. Get help right away if: Your migraine headache gets very bad. Your migraine headache lasts longer than 72 hours. You have a fever. You have a stiff neck. You have trouble seeing. Your muscles feel weak or like you cannot control them. You start to lose your balance a lot. You start to have trouble walking. You pass out (faint). You have a seizure. Summary A migraine headache is a very strong throbbing pain on one side or both sides of your head. These headaches can also cause other symptoms. This condition may be treated with medicines and changes to your lifestyle. Keep a journal to find out what may bring on your migraine headaches. Contact a doctor if you get a migraine headache that is different or worse than others you have had. Contact your doctor if you have more than 15 headache days in a month. This information is not intended to replace advice given to you by your health care provider. Make sure you discuss any questions you have with your health care provider. Document Revised: 07/14/2018 Document Reviewed: 05/04/2018 Elsevier Patient Education  2022 Elsevier Inc. Rizatriptan Disintegrating Tablets What is this medication? RIZATRIPTAN (rye za TRIP tan) treats migraines. It works by blocking pain signals and narrowing blood vessels in the brain. It belongs to a group of medications called triptans. It is not used to prevent migraines. This medicine may be used for other purposes; ask your health care provider or pharmacist if you have questions. COMMON BRAND NAME(S): Maxalt-MLT What should I tell my care team before I take this medication? They need to know if you have any of  these conditions: Cigarette smoker Circulation problems in fingers and toes Diabetes Heart disease High blood pressure High cholesterol History of irregular heartbeat History of stroke Kidney disease Liver disease Stomach or intestine problems An unusual or allergic reaction to rizatriptan, other medications, foods, dyes, or preservatives Pregnant or trying to get pregnant Breast-feeding How should I use this medication? Take this medication by mouth. Follow the directions on the prescription label. Leave the tablet in the sealed blister pack until you are ready to take it. With dry hands, open the blister and gently remove the tablet. If the tablet breaks or crumbles, throw it away and take a new tablet out of the blister pack. Place the tablet in the mouth and allow it to dissolve, and then swallow. Do not cut, crush, or chew this medication. You do not need water to take this medication. Do not take it more often than directed. Talk to your care team regarding the use of this medication in children. While this medication may be prescribed for children as young as 6 years for selected conditions, precautions do apply. Overdosage: If you think you have taken too much of this medicine contact a poison control center or emergency room at once. NOTE: This medicine is only for you. Do not share this medicine with others. What if I miss a dose? This does not apply. This medication is not for regular use. What may  interact with this medication? Do not take this medication with any of the following medications: Certain medications for migraine headache like almotriptan, eletriptan, frovatriptan, naratriptan, rizatriptan, sumatriptan, zolmitriptan Ergot alkaloids like dihydroergotamine, ergonovine, ergotamine, methylergonovine MAOIs like Carbex, Eldepryl, Marplan, Nardil, and Parnate This medication may also interact with the following medications: Certain medications for depression, anxiety, or  psychotic disorders Propranolol This list may not describe all possible interactions. Give your health care provider a list of all the medicines, herbs, non-prescription drugs, or dietary supplements you use. Also tell them if you smoke, drink alcohol, or use illegal drugs. Some items may interact with your medicine. What should I watch for while using this medication? Visit your care team for regular checks on your progress. Tell your care team if your symptoms do not start to get better or if they get worse. You may get drowsy or dizzy. Do not drive, use machinery, or do anything that needs mental alertness until you know how this medication affects you. Do not stand up or sit up quickly, especially if you are an older patient. This reduces the risk of dizzy or fainting spells. Alcohol may interfere with the effect of this medication. Your mouth may get dry. Chewing sugarless gum or sucking hard candy and drinking plenty of water may help. Contact your care team if the problem does not go away or is severe. If you take migraine medications for 10 or more days a month, your migraines may get worse. Keep a diary of headache days and medication use. Contact your care team if your migraine attacks occur more frequently. What side effects may I notice from receiving this medication? Side effects that you should report to your care team as soon as possible: Allergic reactions-skin rash, itching, hives, swelling of the face, lips, tongue, or throat Burning, pain, tingling, or color changes in the legs or feet Heart attack-pain or tightness in the chest, shoulders, arms, or jaw, nausea, shortness of breath, cold or clammy skin, feeling faint or lightheaded Heart rhythm changes-fast or irregular heartbeat, dizziness, feeling faint or lightheaded, chest pain, trouble breathing Increase in blood pressure Irritability, confusion, fast or irregular heartbeat, muscle stiffness, twitching muscles, sweating, high  fever, seizure, chills, vomiting, diarrhea, which may be signs of serotonin syndrome Raynaud's-cool, numb, or painful fingers or toes that may change color from pale, to blue, to red Seizures Stroke-sudden numbness or weakness of the face, arm, or leg, trouble speaking, confusion, trouble walking, loss of balance or coordination, dizziness, severe headache, change in vision Sudden or severe stomach pain, nausea, vomiting, fever, or bloody diarrhea Vision loss Side effects that usually do not require medical attention (report to your care team if they continue or are bothersome): Dizziness General discomfort or fatigue This list may not describe all possible side effects. Call your doctor for medical advice about side effects. You may report side effects to FDA at 1-800-FDA-1088. Where should I keep my medication? Keep out of the reach of children and pets. Store at room temperature between 15 and 30 degrees C (59 and 86 degrees F). Protect from light and moisture. Throw away any unused medication after the expiration date. NOTE: This sheet is a summary. It may not cover all possible information. If you have questions about this medicine, talk to your doctor, pharmacist, or health care provider.  2022 Elsevier/Gold Standard (2020-04-16 16:19:19)

## 2020-12-31 LAB — BASIC METABOLIC PANEL
BUN/Creatinine Ratio: 11 (ref 9–20)
BUN: 13 mg/dL (ref 6–24)
CO2: 19 mmol/L — ABNORMAL LOW (ref 20–29)
Calcium: 9.5 mg/dL (ref 8.7–10.2)
Chloride: 106 mmol/L (ref 96–106)
Creatinine, Ser: 1.22 mg/dL (ref 0.76–1.27)
Glucose: 85 mg/dL (ref 70–99)
Potassium: 5.2 mmol/L (ref 3.5–5.2)
Sodium: 141 mmol/L (ref 134–144)
eGFR: 69 mL/min/{1.73_m2} (ref >59)

## 2020-12-31 LAB — TSH: TSH: 2.16 u[IU]/mL (ref 0.450–4.500)

## 2021-01-05 ENCOUNTER — Telehealth: Payer: Self-pay | Admitting: Neurology

## 2021-01-05 NOTE — Telephone Encounter (Signed)
MR Brain w/wo contrast Dr. Lucia Gaskins [facility] Berkley Harvey: [id number] (exp. 01/01/21 to 02/15/21). Patient is scheduled at [facility] for 01/13/21.

## 2021-01-13 ENCOUNTER — Other Ambulatory Visit: Payer: Self-pay | Admitting: Neurology

## 2021-01-13 ENCOUNTER — Ambulatory Visit: Payer: 59

## 2021-01-13 DIAGNOSIS — R51 Headache with orthostatic component, not elsewhere classified: Secondary | ICD-10-CM

## 2021-01-13 DIAGNOSIS — R519 Headache, unspecified: Secondary | ICD-10-CM

## 2021-01-13 DIAGNOSIS — H539 Unspecified visual disturbance: Secondary | ICD-10-CM

## 2021-01-13 DIAGNOSIS — G8929 Other chronic pain: Secondary | ICD-10-CM

## 2021-01-20 ENCOUNTER — Telehealth: Payer: Self-pay | Admitting: *Deleted

## 2021-01-20 NOTE — Telephone Encounter (Signed)
-----   Message from Anson Fret, MD sent at 01/20/2021  2:38 PM EDT ----- Please call patient, he did not read his results on mychart: Randy Park, The MRI of your brain does not show anything new or currently concerning. We see the remnants of your old injury from 2007. You have mild chronic sinus disease. We also see something called "chronic microvascular ischemic changes" which are mild and commonly seen with aging but these can be made worse by things like high blood pressure, smoking so I suggest that you quit smoking and  .Marland KitchenMarland Kitchen

## 2021-01-20 NOTE — Telephone Encounter (Signed)
I called the pt and discussed his MRI brain results as noted by Dr Lucia Gaskins in detail below. The pt verbalized understanding and appreciation for the call. He did not have any questions at this time.

## 2021-03-03 ENCOUNTER — Telehealth: Payer: Self-pay | Admitting: *Deleted

## 2021-03-03 NOTE — Telephone Encounter (Signed)
Pt medical records @ the front desk for p/u  

## 2021-04-05 DIAGNOSIS — M1990 Unspecified osteoarthritis, unspecified site: Secondary | ICD-10-CM

## 2021-04-05 HISTORY — DX: Unspecified osteoarthritis, unspecified site: M19.90

## 2021-05-04 ENCOUNTER — Encounter: Payer: Self-pay | Admitting: Adult Health

## 2021-05-04 ENCOUNTER — Ambulatory Visit: Payer: 59 | Admitting: Adult Health

## 2021-05-25 ENCOUNTER — Other Ambulatory Visit: Payer: Self-pay

## 2021-05-25 ENCOUNTER — Ambulatory Visit (INDEPENDENT_AMBULATORY_CARE_PROVIDER_SITE_OTHER): Payer: 59 | Admitting: Podiatry

## 2021-05-25 DIAGNOSIS — L989 Disorder of the skin and subcutaneous tissue, unspecified: Secondary | ICD-10-CM | POA: Diagnosis not present

## 2021-05-25 NOTE — Progress Notes (Signed)
° °  Subjective: 61 y.o. male presenting to the office today as a new patient for evaluation of pain and tenderness associated to the plantar aspect of the fifth toe where there is a callus developing.  He gets pedicures every 2 weeks but has been experiencing significant pain and tenderness associated to the callus.  He presents for further treatment and evaluation   Past Medical History:  Diagnosis Date   COLITIS 08/26/2007   Qualifier: Diagnosis of  By: Marland Mcalpine     Diverticulosis    GERD (gastroesophageal reflux disease)    Headache    HX MIGRAINES   Hiatal hernia    Hyperplastic colonic polyp - colonoscopy 2008 08/26/2007   Qualifier: Diagnosis of  By: Marland Mcalpine       Objective:  Physical Exam General: Alert and oriented x3 in no acute distress  Dermatology: Hyperkeratotic lesion(s) present on the plantar aspect of the fifth MTP right. Pain on palpation with a central nucleated core noted. Skin is warm, dry and supple bilateral lower extremities. Negative for open lesions or macerations.  Vascular: Palpable pedal pulses bilaterally. No edema or erythema noted. Capillary refill within normal limits.  Neurological: Epicritic and protective threshold grossly intact bilaterally.   Musculoskeletal Exam: Pain on palpation at the keratotic lesion(s) noted. Range of motion within normal limits bilateral. Muscle strength 5/5 in all groups bilateral.  Assessment: 1.  Symptomatic preulcerative callus/corn plantar fifth MTP right   Plan of Care:  1. Patient evaluated 2. Excisional debridement of keratoic lesion(s) using a chisel blade was performed without incident.  3.  Salicylic acid and Band-Aid applied 4.  Recommend OTC corn callus remover daily  5.  Patient is to return to the clinic PRN.   Edrick Kins, DPM [facility]  Dr. Edrick Kins, DPM    [address], [place] [address]                 Office [phone]  Fax [phone]

## 2021-06-21 ENCOUNTER — Encounter (HOSPITAL_COMMUNITY): Payer: Self-pay | Admitting: Emergency Medicine

## 2021-06-21 ENCOUNTER — Emergency Department (HOSPITAL_COMMUNITY)
Admission: EM | Admit: 2021-06-21 | Discharge: 2021-06-21 | Disposition: A | Discharge status: Home / Self Care | Payer: 59 | Attending: Emergency Medicine | Admitting: Emergency Medicine

## 2021-06-21 DIAGNOSIS — T7840XA Allergy, unspecified, initial encounter: Secondary | ICD-10-CM | POA: Diagnosis not present

## 2021-06-21 DIAGNOSIS — R21 Rash and other nonspecific skin eruption: Secondary | ICD-10-CM

## 2021-06-21 DIAGNOSIS — L299 Pruritus, unspecified: Secondary | ICD-10-CM | POA: Insufficient documentation

## 2021-06-21 MED ORDER — PREDNISONE 20 MG PO TABS: 20.0000 mg | ORAL_TABLET | Freq: Two times a day (BID) | ORAL | 0 refills | Status: DC

## 2021-06-21 MED ORDER — PREDNISONE 20 MG PO TABS
60.0000 mg | ORAL_TABLET | Freq: Once | ORAL | Status: AC
  Administered 2021-06-21: 60 mg via ORAL
  Filled 2021-06-21: qty 3

## 2021-06-21 NOTE — ED Provider Notes (Signed)
?[facility]-EMERGENCY DEPT ?Provider Note ? ? ?CSN: [id number] ?Arrival date & time: 06/21/21  0546 ? ?  ? ?History ? ?Chief Complaint  ?Patient presents with  ? Rash  ? ? ?Randy Park is a 61 y.o. male. ? ?HPI ?He complains of itchiness of his head, face, scalp, arms and chest.  He feels like it started after eating a sandwich 2 days ago.  He is using Benadryl with partial relief.  He denies shortness of breath, nausea, vomiting, weakness or dizziness. ?  ? ?Home Medications ?Prior to Admission medications   ?Medication Sig Start Date End Date Taking? Authorizing Provider  ?atorvastatin (LIPITOR) 10 MG tablet Take 1 tablet (10 mg total) by mouth daily. 08/13/20   Mliss Sax, MD  ?augmented betamethasone dipropionate (DIPROLENE-AF) 0.05 % cream Apply 1 application topically daily. 09/02/20   [provider]  ?rizatriptan (MAXALT-MLT) 10 MG disintegrating tablet Take 1 tablet (10 mg total) by mouth as needed for migraine. May repeat in 2 hours if needed 12/30/20   Anson Fret, MD  ?varenicline (CHANTIX) 0.5 MG tablet Take 1 tablet (0.5 mg total) by mouth 2 (two) times daily. ?Patient not taking: Reported on 12/30/2020 07/28/20   Mliss Sax, MD  ?   ? ?Allergies    ?Amoxicillin   ? ?Review of Systems   ?Review of Systems ? ?Physical Exam ?Updated Vital Signs ?BP 136/86   Pulse 71   Temp (!) 97.4 ?F (36.3 ?C) (Oral)   Resp 16   SpO2 95%  ?Physical Exam ?Vitals and nursing note reviewed.  ?Constitutional:   ?   General: He is not in acute distress. ?   Appearance: He is well-developed. He is not ill-appearing, toxic-appearing or diaphoretic.  ?HENT:  ?   Head: Normocephalic and atraumatic.  ?   Right Ear: External ear normal.  ?   Left Ear: External ear normal.  ?Eyes:  ?   Conjunctiva/sclera: Conjunctivae normal.  ?   Pupils: Pupils are equal, round, and reactive to light.  ?Neck:  ?   Trachea: Phonation normal.  ?Cardiovascular:  ?   Rate and Rhythm:  Normal rate.  ?Pulmonary:  ?   Effort: Pulmonary effort is normal.  ?   Breath sounds: Normal breath sounds.  ?Abdominal:  ?   General: There is no distension.  ?   Tenderness: There is no abdominal tenderness.  ?Musculoskeletal:     ?   General: Normal range of motion.  ?   Cervical back: Normal range of motion and neck supple.  ?Skin: ?   General: Skin is warm and dry.  ?   Comments: Scattered red papules, scalp, face, arms and hands.  Most are 1 to 2 cm, slightly excoriated from scratching but no associated erythema, fluctuance, bleeding or drainage.  ?Neurological:  ?   Mental Status: He is alert and oriented to person, place, and time.  ?   Cranial Nerves: No cranial nerve deficit.  ?   Sensory: No sensory deficit.  ?   Motor: No abnormal muscle tone.  ?   Coordination: Coordination normal.  ?Psychiatric:     ?   Mood and Affect: Mood normal.     ?   Behavior: Behavior normal.     ?   Thought Content: Thought content normal.     ?   Judgment: Judgment normal.  ? ? ?ED Results / Procedures / Treatments   ?Labs ?(all labs ordered are listed, but only  abnormal results are displayed) ?Labs Reviewed - No data to display ? ?EKG ?None ? ?Radiology ?No results found. ? ?Procedures ?Procedures  ? ? ?Medications Ordered in ED ?Medications - No data to display ? ?ED Course/ Medical Decision Making/ A&P ?  ?                        ?Medical Decision Making ?Patient presenting with rash for a day and a half, pruritic.  No clear cause. ? ?Problems Addressed: ?Rash: acute illness or injury ?   Details: Appears allergic related ? ?Amount and/or Complexity of Data Reviewed ?Independent Historian:  ?   Details: Cogent historian ? ?Risk ?OTC drugs. ?Prescription drug management. ?Decision regarding hospitalization. ?Risk Details: Patient presenting with isolated rash of the skin, which appears to be hives.  No clear cause.  No evidence for folliculitis, dermatitis, generalized systemic allergic reaction.  No indication for  hospitalization at this time.  He will be treated with prednisone and instructed to continue to use Benadryl. ? ? ? ? ? ? ? ? ? ? ?Final Clinical Impression(s) / ED Diagnoses ?Final diagnoses:  ?None  ? ? ?Rx / DC Orders ?ED Discharge Orders   ? ? None  ? ?  ? ? ?  ?Mancel Bale, MD ?06/21/21 3875 ? ?

## 2021-06-21 NOTE — ED Notes (Signed)
I provided reinforced discharge education based off of discharge instructions. Pt acknowledged and understood my education. Pt had no further questions/concerns for provider/myself.  °

## 2021-06-21 NOTE — Discharge Instructions (Signed)
Your rash appears to be hives, indicating allergic reaction.  Continue taking Benadryl 4 times a day.  We are prescribing prednisone to help treat an allergic reaction.  Start taking it later today.  Follow-up with your doctor if not improving. ?

## 2021-06-21 NOTE — ED Triage Notes (Signed)
Pt reports itchy rash on neck, back, and hands that started yesterday. Unsure of triggers. No throat itching or swelling.  ?

## 2021-08-31 NOTE — Addendum Note (Signed)
Encounter addended by: Novella Olive on: 08/31/2021 12:00 PM  Actions taken: Letter saved

## 2021-10-11 ENCOUNTER — Emergency Department (HOSPITAL_COMMUNITY)
Admission: EM | Admit: 2021-10-11 | Discharge: 2021-10-11 | Disposition: A | Discharge status: Home / Self Care | Payer: 59 | Attending: Student | Admitting: Student

## 2021-10-11 ENCOUNTER — Encounter (HOSPITAL_COMMUNITY): Payer: Self-pay

## 2021-10-11 DIAGNOSIS — L509 Urticaria, unspecified: Secondary | ICD-10-CM | POA: Insufficient documentation

## 2021-10-11 MED ORDER — PREDNISONE 50 MG PO TABS: 50.0000 mg | ORAL_TABLET | Freq: Every day | ORAL | 0 refills | Status: DC

## 2021-10-11 MED ORDER — HYDROXYZINE HCL 25 MG PO TABS: 25.0000 mg | ORAL_TABLET | Freq: Four times a day (QID) | ORAL | 0 refills | Status: DC

## 2021-10-11 NOTE — ED Triage Notes (Signed)
Pt presents with c/o possible allergic reaction that resulted in a rash over his body. Pt unsure as to what he came in contact with or had a reaction to but reports that the rash started yesterday. Pt is alert and oriented, in in acute distress.

## 2021-10-11 NOTE — ED Provider Notes (Signed)
[facility]-EMERGENCY DEPT Provider Note   CSN: [id number] Arrival date & time: 10/11/21  1409   History  Chief Complaint  Patient presents with   Allergic Reaction    Randy Park is a 61 y.o. male history of Hyperlipidemia here for evaluation of urticaria.  Patient states began yesterday.  States he has been working with a Armed forces operational officer to figure out why he has had recurrent skin issues.  He denies any lotions, perfumes, sensation of throat closing.  Rashes not painful.  Rash is pruritic.  Located diffusely to anterior posterior trunk as well as bilateral upper and lower extremities.  No new medication changes.  He has been taking Benadryl at home without relief.  He is not on a chronic antihistamine.  States he has had something previously and has needed recurrent bouts of steroids to help with his symptoms.  He states he is currently being followed by a dermatologist for something similarly.  No fever, emesis, rash to palms and soles, no rash to mucous membranes, no joint pain, history of IV drug use  HPI     Home Medications Prior to Admission medications   Medication Sig Start Date End Date Taking? Authorizing Provider  hydrOXYzine (ATARAX) 25 MG tablet Take 1 tablet (25 mg total) by mouth every 6 (six) hours. 10/11/21  Yes Viveca Beckstrom A, PA-C  predniSONE (DELTASONE) 50 MG tablet Take 1 tablet (50 mg total) by mouth daily for 5 days. 10/11/21 10/16/21 Yes Kohl Polinsky A, PA-C  atorvastatin (LIPITOR) 10 MG tablet Take 1 tablet (10 mg total) by mouth daily. 08/13/20   Mliss Sax, MD  augmented betamethasone dipropionate (DIPROLENE-AF) 0.05 % cream Apply 1 application topically daily. 09/02/20   [provider]  rizatriptan (MAXALT-MLT) 10 MG disintegrating tablet Take 1 tablet (10 mg total) by mouth as needed for migraine. May repeat in 2 hours if needed 12/30/20   Anson Fret, MD  varenicline (CHANTIX) 0.5 MG tablet Take 1 tablet  (0.5 mg total) by mouth 2 (two) times daily. Patient not taking: Reported on 12/30/2020 07/28/20   Mliss Sax, MD      Allergies    Amoxicillin    Review of Systems   Review of Systems  Constitutional: Negative.   HENT: Negative.    Respiratory: Negative.    Cardiovascular: Negative.   Gastrointestinal: Negative.   Genitourinary: Negative.   Musculoskeletal: Negative.   Skin:  Positive for rash.  Neurological: Negative.   All other systems reviewed and are negative.   Physical Exam Updated Vital Signs BP 120/71 (BP Location: Left Arm)   Pulse 80   Temp 98.4 F (36.9 C) (Oral)   Resp 16   SpO2 97%  Physical Exam Vitals and nursing note reviewed.  Constitutional:      General: He is not in acute distress.    Appearance: He is well-developed. He is not ill-appearing, toxic-appearing or diaphoretic.  HENT:     Head: Normocephalic and atraumatic.     Nose: Nose normal.     Mouth/Throat:     Mouth: Mucous membranes are moist.  Eyes:     Pupils: Pupils are equal, round, and reactive to light.  Cardiovascular:     Rate and Rhythm: Normal rate and regular rhythm.     Pulses: Normal pulses.     Heart sounds: Normal heart sounds.  Pulmonary:     Effort: Pulmonary effort is normal. No respiratory distress.     Breath sounds: Normal  breath sounds.  Abdominal:     General: Bowel sounds are normal. There is no distension.     Palpations: Abdomen is soft.  Musculoskeletal:        General: No swelling, tenderness, deformity or signs of injury. Normal range of motion.     Cervical back: Normal range of motion and neck supple.     Right lower leg: No edema.     Left lower leg: No edema.  Skin:    General: Skin is warm and dry.     Findings: Rash present.     Comments: Diffuse urticarial lesions anterior posterior trunk as well as bilateral upper and lower extremities.  No rash to palms and soles.  No mucous membrane involvement.  No GU symptoms.  No vesicles, bulla,  target lesions, desquamated skin, petechiae or purpura  Neurological:     General: No focal deficit present.     Mental Status: He is alert and oriented to person, place, and time.     ED Results / Procedures / Treatments   Labs (all labs ordered are listed, but only abnormal results are displayed) Labs Reviewed - No data to display  EKG None  Radiology No results found.  Procedures Procedures    Medications Ordered in ED Medications - No data to display  ED Course/ Medical Decision Making/ A&P     61 year old here for evaluation of urticaria.  Has had something similarly in the past.  Has needed steroids previously.  Attempting Benadryl at home without relief.  No new lotions, perfumes, medication changes.  He has no evidence of anaphylaxis on exam.  Does have some splotchy diffuse urticaria.  No mucous membrane involvement, rash to palms or soles.  He is currently being followed by dermatology.  No chronic antihistamine use at home.  Rash consistent with urticaria. Patient denies any difficulty breathing or swallowing.  Pt has a patent airway without stridor and is handling secretions without difficulty; no angioedema. No blisters, no pustules, no warmth, no draining sinus tracts, no superficial abscesses, no bullous impetigo, no vesicles, no desquamation, no target lesions with dusky purpura or a central bulla. Not tender to touch. No concern for superimposed infection. No concern for SJS, TEN, TSS, tick borne illness, syphilis or other life-threatening condition. Will discharge home with short course of steroids, hydroxyzine.  Discussed trial of antihistamine such as Zyrtec or Claritin after his acute rash resolves  The patient has been appropriately medically screened and/or stabilized in the ED. I have low suspicion for any other emergent medical condition which would require further screening, evaluation or treatment in the ED or require inpatient management.  Patient is  hemodynamically stable and in no acute distress.  Patient able to ambulate in department prior to ED.  Evaluation does not show acute pathology that would require ongoing or additional emergent interventions while in the emergency department or further inpatient treatment.  I have discussed the diagnosis with the patient and answered all questions.  Pain is been managed while in the emergency department and patient has no further complaints prior to discharge.  Patient is comfortable with plan discussed in room and is stable for discharge at this time.  I have discussed strict return precautions for returning to the emergency department.  Patient was encouraged to follow-up with PCP/specialist refer to at discharge.                            Medical  Decision Making Amount and/or Complexity of Data Reviewed External Data Reviewed: notes.  Risk OTC drugs. Prescription drug management. Diagnosis or treatment significantly limited by social determinants of health.           Final Clinical Impression(s) / ED Diagnoses Final diagnoses:  Urticaria    Rx / DC Orders ED Discharge Orders          Ordered    predniSONE (DELTASONE) 50 MG tablet  Daily        10/11/21 1538    hydrOXYzine (ATARAX) 25 MG tablet  Every 6 hours        10/11/21 1538              Elyssia Strausser A, PA-C 10/11/21 1539    Kommor, [place], MD 10/12/21 1220

## 2021-10-11 NOTE — Discharge Instructions (Signed)
Take the hydroxyzine as needed for itching  Start the prednisone  After this rash has resolved please start taking Zyrtec or Claritin daily to prevent further rash  Keep close follow-up with your dermatologist  Return for new or worsening symptoms

## 2021-10-16 ENCOUNTER — Ambulatory Visit (INDEPENDENT_AMBULATORY_CARE_PROVIDER_SITE_OTHER): Payer: 59 | Admitting: Family Medicine

## 2021-10-16 ENCOUNTER — Encounter: Payer: Self-pay | Admitting: Family Medicine

## 2021-10-16 ENCOUNTER — Other Ambulatory Visit (HOSPITAL_COMMUNITY)
Admission: RE | Admit: 2021-10-16 | Discharge: 2021-10-16 | Disposition: A | Discharge status: Home / Self Care | Payer: 59 | Source: Ambulatory Visit | Attending: Family Medicine | Admitting: Family Medicine

## 2021-10-16 VITALS — BP 122/68 | HR 80 | Temp 97.0°F | Ht 71.0 in | Wt 192.8 lb

## 2021-10-16 DIAGNOSIS — Z720 Tobacco use: Secondary | ICD-10-CM | POA: Diagnosis not present

## 2021-10-16 DIAGNOSIS — R3911 Hesitancy of micturition: Secondary | ICD-10-CM

## 2021-10-16 DIAGNOSIS — N401 Enlarged prostate with lower urinary tract symptoms: Secondary | ICD-10-CM

## 2021-10-16 DIAGNOSIS — A599 Trichomoniasis, unspecified: Secondary | ICD-10-CM | POA: Diagnosis not present

## 2021-10-16 DIAGNOSIS — Z716 Tobacco abuse counseling: Secondary | ICD-10-CM

## 2021-10-16 MED ORDER — VARENICLINE TARTRATE 1 MG PO TABS: 1.0000 mg | ORAL_TABLET | Freq: Two times a day (BID) | ORAL | 2 refills | Status: DC

## 2021-10-16 MED ORDER — TAMSULOSIN HCL 0.4 MG PO CAPS: 0.4000 mg | ORAL_CAPSULE | Freq: Every day | ORAL | 3 refills | Status: DC

## 2021-10-16 NOTE — Progress Notes (Signed)
Established Patient Office Visit  Subjective   Patient ID: Randy Park, male    DOB: 04-24-1960  Age: 61 y.o. MRN: [id number]  Chief Complaint  Patient presents with   Follow-up    Routine follow up, discuss prostate patient not fasting.     HPI evaluation of a 2 to 21-month history of urinary hesitancy, decrease in force of stream, postvoid dribble, frequency with some urgency.  He is not having nocturia least on the regular basis.  There is no real dysuria.  He is not sexually active.  Prostate was enlarged by exam with last physical.  Wonders how much Chantix helped him stop smoking.  He was able to stop for a few months then restarted again.  He is taking Chantix a couple times and not had any issues with it such as depression and anger or irritability.    Review of Systems  Constitutional: Negative.   HENT: Negative.    Eyes:  Negative for blurred vision, discharge and redness.  Respiratory: Negative.    Cardiovascular: Negative.   Gastrointestinal:  Negative for abdominal pain.  Genitourinary:  Positive for frequency and urgency. Negative for hematuria.  Musculoskeletal: Negative.  Negative for myalgias.  Skin:  Negative for rash.  Neurological:  Negative for tingling, loss of consciousness and weakness.  Endo/Heme/Allergies:  Negative for polydipsia.      Objective:     BP 122/68 (BP Location: Right Arm, Patient Position: Sitting, Cuff Size: Large)   Pulse 80   Temp (!) 97 F (36.1 C) (Temporal)   Ht 5\' 11"  (1.803 m)   Wt 192 lb 12.8 oz (87.5 kg)   SpO2 94%   BMI 26.89 kg/m    Physical Exam Constitutional:      General: He is not in acute distress.    Appearance: Normal appearance. He is not ill-appearing, toxic-appearing or diaphoretic.  HENT:     Head: Normocephalic and atraumatic.     Right Ear: External ear normal.     Left Ear: External ear normal.  Eyes:     General: No scleral icterus.       Right eye: No discharge.        Left eye: No  discharge.     Extraocular Movements: Extraocular movements intact.     Conjunctiva/sclera: Conjunctivae normal.  Cardiovascular:     Rate and Rhythm: Normal rate and regular rhythm.  Pulmonary:     Effort: Pulmonary effort is normal. No respiratory distress.     Breath sounds: Normal breath sounds.  Musculoskeletal:     Cervical back: No rigidity or tenderness.  Skin:    General: Skin is warm and dry.  Neurological:     Mental Status: He is alert and oriented to person, place, and time.  Psychiatric:        Mood and Affect: Mood normal.        Behavior: Behavior normal.      No results found for any visits on 10/16/21.    The 10-year ASCVD risk score (Arnett DK, et al., 2019) is: 11.5%    Assessment & Plan:   Problem List Items Addressed This Visit       Genitourinary   Benign prostatic hyperplasia with urinary hesitancy   Relevant Medications   tamsulosin (FLOMAX) 0.4 MG CAPS capsule   Other Relevant Orders   Urinalysis, Routine w reflex microscopic   Urine cytology ancillary only     Other   Tobacco use - Primary  Relevant Medications   varenicline (CHANTIX) 1 MG tablet    Return in about 3 months (around 01/16/2022).  Trial of tamsulosin.  Information given on this medication as well as BPH.  Asked him to avoid more than a cup or 2 of coffee each morning.  He is aware that there is caffeine and sodas and we will discontinue those.  Restart Chantix and advised him to continue it quits he will let me know if this.  There are any issues with this.  Mliss Sax, MD

## 2021-10-17 LAB — URINALYSIS, ROUTINE W REFLEX MICROSCOPIC
Bilirubin Urine: NEGATIVE
Glucose, UA: NEGATIVE
Hgb urine dipstick: NEGATIVE
Leukocytes,Ua: NEGATIVE
Nitrite: NEGATIVE
Protein, ur: NEGATIVE
Specific Gravity, Urine: 1.024 (ref 1.001–1.035)
pH: 5.5 (ref 5.0–8.0)

## 2021-10-21 LAB — URINE CYTOLOGY ANCILLARY ONLY
Chlamydia: NEGATIVE
Comment: NEGATIVE
Comment: NEGATIVE
Comment: NORMAL
Neisseria Gonorrhea: NEGATIVE
Trichomonas: POSITIVE — AB

## 2021-10-23 MED ORDER — METRONIDAZOLE 500 MG PO TABS
500.0000 mg | ORAL_TABLET | Freq: Three times a day (TID) | ORAL | 0 refills | Status: AC
Start: 2021-10-23 — End: 2021-10-30

## 2021-10-23 NOTE — Addendum Note (Signed)
Addended by: Andrez Grime on: 10/23/2021 08:01 AM   Modules accepted: Orders

## 2021-10-23 NOTE — Addendum Note (Signed)
Addended by: Andrez Grime on: 10/23/2021 08:02 AM   Modules accepted: Orders

## 2021-12-28 ENCOUNTER — Ambulatory Visit: Payer: 59 | Admitting: Family Medicine

## 2022-01-01 NOTE — Progress Notes (Unsigned)
I, Randy Park, LAT, ATC acting as a scribe for Randy Leader, MD.  Subjective:    CC: L hip pain  HPI: Pt is a 61 y/o male c/o L hip pain. Pt was last seen by Randy Park on 07/16/19 for this complaint and at the time was 75% better. Pt reports L hip pain has re-worsened over the last several months, but he has been putting office a return visit. Pt locates pain to the lateral aspect of the L hip. No radiating pain. No numbness/tingling.  He notes a previous lateral hip injection did not provide much pain relief.  Dx imaging: 07/07/19 L hip MRI  05/11/19 L hip XR  Pertinent review of Systems: No fevers or chills  Relevant historical information: History of a colostomy.   Objective:    Vitals:   01/04/22 1024  BP: 128/76  Pulse: 88  SpO2: 95%   General: Well Developed, well nourished, and in no acute distress.   MSK: Left hip: Normal-appearing Tender palpation greater trochanter.  Decreased hip range of motion pain with flexion internal rotation. Range of motion is limited.  Strength is limited to flexion and abduction.  Lab and Radiology Results  Procedure: Real-time Ultrasound Guided Injection of left hip femoral acetabular joint anterior approach Device: Philips Affiniti 50G Images permanently stored and available for review in PACS Verbal informed consent obtained.  Discussed risks and benefits of procedure. Warned about infection, bleeding, hyperglycemia damage to structures among others. Patient expresses understanding and agreement Time-out conducted.   Noted no overlying erythema, induration, or other signs of local infection.   Skin prepped in a sterile fashion.   Local anesthesia: Topical Ethyl chloride.   With sterile technique and under real time ultrasound guidance: 40 mg of Kenalog and 2 mL of Marcaine injected into hip joint. Fluid seen entering the joint capsule.   Completed without difficulty   Pain immediately resolved suggesting accurate placement of  the medication.   Advised to call if fevers/chills, erythema, induration, drainage, or persistent bleeding.   Images permanently stored and available for review in the ultrasound unit.  Impression: Technically successful ultrasound guided injection.   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  No acute fractures are present. Await formal radiology review  EXAM: MR OF THE LEFT HIP WITHOUT CONTRAST   TECHNIQUE: Multiplanar, multisequence MR imaging was performed. No intravenous contrast was administered.   COMPARISON:  Radiographs 05/11/2019   FINDINGS: Both hips are normally located. Moderate to advanced left hip joint degenerative changes for age with joint space narrowing, spurring and subchondral cystic change. The right hip demonstrates moderate degenerative changes. No stress fracture or AVN.   The superior labrum on the left is torn. No discrete paralabral cyst. There is a small hip joint effusion.   The pubic symphysis and SI joints are intact. No pelvic fractures or bone lesions.   Mild peritendinitis but no findings for trochanteric bursitis. The surrounding hip and pelvic musculature appear grossly normal. No muscle tear, myositis or mass. The hamstring tendons are intact.   No significant intrapelvic abnormalities are identified. Mild prostate gland enlargement.   IMPRESSION: 1. 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 2. Degenerated and torn left superior labrum. 3. No stress fracture or AVN. 4. Mild peritendinitis but no findings for trochanteric bursitis.     Electronically Signed   By: Marijo Sanes M.D.   On: 07/07/2019 15:34   I,  Randy Park, personally (independently) visualized and performed the interpretation of the images attached in this note.   Impression and Recommendations:    Assessment and Plan: 61 y.o. male with left hip pain.  His  imaging indicates hip arthritis.  However his pain is more lateral which would be more typical for trochanteric bursitis or tendinitis which has not seen much on his more recent imaging.  Today we proceeded with an anterior femoral acetabular joint injection which did provide significant pain relief which again supports hip arthritis is the main source of pain.  I am hopeful that today's injection will provide some lasting pain relief.  However I am less optimistic and expect that it is likely that he will require hip replacement before too long.  He will contact me with an update.  If today's shot should wear off soon would recommend referral to orthopedic surgery to discuss total hip replacement option.Marland Kitchen  PDMP not reviewed this encounter. Orders Placed This Encounter  Procedures   [place] LIMITED JOINT SPACE STRUCTURES LOW LEFT(NO LINKED CHARGES)    Order Specific Question:   Reason for Exam (SYMPTOM  OR DIAGNOSIS REQUIRED)    Answer:   left hip pain    Order Specific Question:   Preferred imaging location?    Answer:   [facility]ult [facility] HIP UNILAT W OR W/O PELVIS 2-3 VIEWS LEFT    Standing Status:   Future    Number of Occurrences:   1    Standing Expiration Date:   02/04/2022    Order Specific Question:   Reason for Exam (SYMPTOM  OR DIAGNOSIS REQUIRED)    Answer:   left hip pain    Order Specific Question:   Preferred imaging location?    Answer:   Randy Park   No orders of the defined types were placed in this encounter.   Discussed warning signs or symptoms. Please see discharge instructions. Patient expresses understanding.   The above documentation has been reviewed and is accurate and complete Randy Park, M.D.

## 2022-01-04 ENCOUNTER — Ambulatory Visit (INDEPENDENT_AMBULATORY_CARE_PROVIDER_SITE_OTHER): Payer: 59 | Admitting: Family Medicine

## 2022-01-04 ENCOUNTER — Ambulatory Visit (INDEPENDENT_AMBULATORY_CARE_PROVIDER_SITE_OTHER): Payer: 59

## 2022-01-04 ENCOUNTER — Ambulatory Visit: Payer: Self-pay

## 2022-01-04 VITALS — BP 128/76 | HR 88 | Ht 71.0 in | Wt 180.0 lb

## 2022-01-04 DIAGNOSIS — M1612 Unilateral primary osteoarthritis, left hip: Secondary | ICD-10-CM

## 2022-01-04 DIAGNOSIS — M25552 Pain in left hip: Secondary | ICD-10-CM

## 2022-01-04 NOTE — Patient Instructions (Addendum)
Thank you for coming in today.   Please get an Xray today before you leave   You received an injection today. Seek immediate medical attention if the joint becomes red, extremely painful, or is oozing fluid.   Keep me posted on how you are feeling. If we need to get you to a surgeon, let me know.

## 2022-01-06 NOTE — Progress Notes (Signed)
Left hip x-ray shows worsening or continued arthritis.  Also there is concern for avascular necrosis.  This is a condition where the blood supply of the hip fails which causes the hip to wear out very fast.  The treatment for this to your age is a hip replacement.  When this shot wears off I would recommend referral to orthopedic surgery to evaluate and consider replacement.

## 2022-01-07 ENCOUNTER — Other Ambulatory Visit: Payer: Self-pay

## 2022-01-07 DIAGNOSIS — M1612 Unilateral primary osteoarthritis, left hip: Secondary | ICD-10-CM

## 2022-01-18 ENCOUNTER — Encounter: Payer: Self-pay | Admitting: Family Medicine

## 2022-01-18 ENCOUNTER — Ambulatory Visit (INDEPENDENT_AMBULATORY_CARE_PROVIDER_SITE_OTHER): Payer: 59 | Admitting: Family Medicine

## 2022-01-18 VITALS — BP 112/68 | HR 72 | Temp 97.0°F | Ht 71.0 in | Wt 182.4 lb

## 2022-01-18 DIAGNOSIS — N401 Enlarged prostate with lower urinary tract symptoms: Secondary | ICD-10-CM | POA: Diagnosis not present

## 2022-01-18 DIAGNOSIS — R3911 Hesitancy of micturition: Secondary | ICD-10-CM | POA: Diagnosis not present

## 2022-01-18 DIAGNOSIS — Z720 Tobacco use: Secondary | ICD-10-CM

## 2022-01-18 DIAGNOSIS — Z716 Tobacco abuse counseling: Secondary | ICD-10-CM | POA: Diagnosis not present

## 2022-01-18 MED ORDER — VARENICLINE TARTRATE 0.5 MG PO TABS: ORAL_TABLET | ORAL | 0 refills | Status: AC

## 2022-01-18 MED ORDER — TAMSULOSIN HCL 0.4 MG PO CAPS: 0.4000 mg | ORAL_CAPSULE | Freq: Every day | ORAL | 1 refills | Status: DC

## 2022-01-18 NOTE — Progress Notes (Signed)
Established Patient Office Visit  Subjective   Patient ID: Randy Park, male    DOB: April 09, 1960  Age: 61 y.o. MRN: [id number]  Chief Complaint  Patient presents with   Follow-up    3 month follow up, no concerns. Patient not fasting.     HPI for follow-up of BPH and his desire for help with quitting smoking.  He has a quit date picked.  He has used Chantix in the past and it helped a great deal.  He had no side effects while taking the medication.  Tamsulosin has helped his urine flow a great deal.  He would like to continue it.  He needs a left hip replacement.  He is ready to go forward with this.  Appointment with Ortho this Friday to discuss surgery date.    Review of Systems  Constitutional: Negative.   HENT: Negative.    Eyes:  Negative for blurred vision, discharge and redness.  Respiratory: Negative.    Cardiovascular: Negative.   Gastrointestinal:  Negative for abdominal pain.  Genitourinary: Negative.   Musculoskeletal:  Positive for joint pain. Negative for myalgias.  Skin:  Negative for rash.  Neurological:  Negative for tingling, loss of consciousness and weakness.  Endo/Heme/Allergies:  Negative for polydipsia.      Objective:     BP 112/68 (BP Location: Left Arm, Patient Position: Sitting, Cuff Size: Normal)   Pulse 72   Temp (!) 97 F (36.1 C) (Temporal)   Ht 5\' 11"  (1.803 m)   Wt 182 lb 6.4 oz (82.7 kg)   SpO2 99%   BMI 25.44 kg/m    Physical Exam Constitutional:      General: He is not in acute distress.    Appearance: Normal appearance. He is not ill-appearing, toxic-appearing or diaphoretic.  HENT:     Head: Normocephalic and atraumatic.     Right Ear: External ear normal.     Left Ear: External ear normal.     Mouth/Throat:     Mouth: Mucous membranes are moist.     Pharynx: Oropharynx is clear. No oropharyngeal exudate or posterior oropharyngeal erythema.  Eyes:     General: No scleral icterus.       Right eye: No discharge.         Left eye: No discharge.     Extraocular Movements: Extraocular movements intact.     Conjunctiva/sclera: Conjunctivae normal.     Pupils: Pupils are equal, round, and reactive to light.  Cardiovascular:     Rate and Rhythm: Normal rate and regular rhythm.  Pulmonary:     Effort: Pulmonary effort is normal. No respiratory distress.     Breath sounds: Normal breath sounds.  Abdominal:     General: Bowel sounds are normal.  Musculoskeletal:     Cervical back: No rigidity or tenderness.     Right hip: No tenderness. Normal range of motion.     Left hip: Tenderness present. Decreased range of motion.  Skin:    General: Skin is warm and dry.  Neurological:     Mental Status: He is alert and oriented to person, place, and time.  Psychiatric:        Mood and Affect: Mood normal.        Behavior: Behavior normal.      No results found for any visits on 01/18/22.    The 10-year ASCVD risk score (Arnett DK, et al., 2019) is: 10.4%    Assessment & Plan:   Problem  List Items Addressed This Visit       Genitourinary   Benign prostatic hyperplasia with urinary hesitancy   Relevant Medications   tamsulosin (FLOMAX) 0.4 MG CAPS capsule     Other   Tobacco use   Relevant Medications   varenicline (CHANTIX) 0.5 MG tablet   Other Visit Diagnoses     Tobacco abuse counseling    -  Primary   Relevant Medications   varenicline (CHANTIX) 0.5 MG tablet       Return in about 6 weeks (around 03/01/2022).  Continue tamsulosin.  We will start Chantix 1 week prior to quit date.  Warned about bad dreams, anger and depression.  He will discontinue the drug immediately if he develops any of the symptoms and let me know.  Otherwise, follow-up in 6 weeks.  Mliss Sax, MD

## 2022-01-22 ENCOUNTER — Ambulatory Visit (INDEPENDENT_AMBULATORY_CARE_PROVIDER_SITE_OTHER): Payer: 59 | Admitting: Orthopaedic Surgery

## 2022-01-22 ENCOUNTER — Encounter: Payer: Self-pay | Admitting: Orthopaedic Surgery

## 2022-01-22 DIAGNOSIS — M1612 Unilateral primary osteoarthritis, left hip: Secondary | ICD-10-CM | POA: Diagnosis not present

## 2022-01-22 MED ORDER — MELOXICAM 7.5 MG PO TABS: 7.5000 mg | ORAL_TABLET | Freq: Two times a day (BID) | ORAL | 2 refills | Status: DC | PRN
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
## 2022-02-06 ENCOUNTER — Other Ambulatory Visit: Payer: Self-pay | Admitting: Family Medicine

## 2022-02-06 DIAGNOSIS — N401 Enlarged prostate with lower urinary tract symptoms: Secondary | ICD-10-CM

## 2022-02-15 ENCOUNTER — Other Ambulatory Visit: Payer: Self-pay | Admitting: Physician Assistant

## 2022-02-15 ENCOUNTER — Telehealth: Payer: Self-pay | Admitting: Orthopaedic Surgery

## 2022-02-15 MED ORDER — TRAMADOL HCL 50 MG PO TABS: 50.0000 mg | ORAL_TABLET | Freq: Three times a day (TID) | ORAL | 2 refills | Status: DC | PRN

## 2022-02-15 NOTE — Telephone Encounter (Signed)
Called and notified patient.

## 2022-02-15 NOTE — Telephone Encounter (Signed)
Patient called advised the Meloxicam is not working and he is in a lot of pain. Patient asked if he can get something stronger? The number to contact patient is [phone]

## 2022-02-15 NOTE — Telephone Encounter (Signed)
Sent in tramadol

## 2022-03-01 ENCOUNTER — Ambulatory Visit (INDEPENDENT_AMBULATORY_CARE_PROVIDER_SITE_OTHER): Payer: 59 | Admitting: Family Medicine

## 2022-03-01 ENCOUNTER — Encounter: Payer: Self-pay | Admitting: Family Medicine

## 2022-03-01 VITALS — BP 122/68 | HR 74 | Temp 97.3°F | Ht 71.0 in | Wt 184.2 lb

## 2022-03-01 DIAGNOSIS — M1612 Unilateral primary osteoarthritis, left hip: Secondary | ICD-10-CM

## 2022-03-01 MED ORDER — MELOXICAM 15 MG PO TABS: 15.0000 mg | ORAL_TABLET | Freq: Every day | ORAL | 1 refills | Status: DC

## 2022-03-01 NOTE — Progress Notes (Signed)
   Established Patient Office Visit  Subjective   Patient ID: Randy Park, male    DOB: Aug 20, 1960  Age: 61 y.o. MRN: [id number]  Chief Complaint  Patient presents with   Follow-up    6 week follow up, no concerns.     HPI complaining of left hip pain.  Scheduled for replacement surgeryon 1/ 24.  Mobic 7.5 mg daily has not helped.  Currently taking tramadol per orthopedic surgery.  Continues to drive with a CDL.    Review of Systems  Constitutional: Negative.   HENT: Negative.    Eyes:  Negative for blurred vision, discharge and redness.  Respiratory: Negative.    Cardiovascular: Negative.   Gastrointestinal:  Negative for abdominal pain.  Genitourinary: Negative.   Musculoskeletal:  Positive for joint pain. Negative for myalgias.  Skin:  Negative for rash.  Neurological:  Negative for tingling, loss of consciousness and weakness.  Endo/Heme/Allergies:  Negative for polydipsia.      Objective:     BP 122/68 (BP Location: Left Arm, Patient Position: Sitting, Cuff Size: Normal)   Pulse 74   Temp (!) 97.3 F (36.3 C) (Temporal)   Ht 5\' 11"  (1.803 m)   Wt 184 lb 3.2 oz (83.6 kg)   SpO2 97%   BMI 25.69 kg/m    Physical Exam Constitutional:      General: He is not in acute distress.    Appearance: Normal appearance. He is not ill-appearing, toxic-appearing or diaphoretic.  HENT:     Head: Normocephalic and atraumatic.     Right Ear: External ear normal.     Left Ear: External ear normal.     Mouth/Throat:     Pharynx: No oropharyngeal exudate or posterior oropharyngeal erythema.  Eyes:     General: No scleral icterus.       Right eye: No discharge.        Left eye: No discharge.     Extraocular Movements: Extraocular movements intact.     Conjunctiva/sclera: Conjunctivae normal.  Pulmonary:     Effort: Pulmonary effort is normal. No respiratory distress.  Abdominal:     Tenderness: There is no guarding.  Musculoskeletal:     Cervical back: No  rigidity or tenderness.     Right hip: Normal range of motion.     Left hip: Decreased range of motion.       Legs:  Skin:    General: Skin is warm and dry.  Neurological:     Mental Status: He is alert and oriented to person, place, and time.  Psychiatric:        Mood and Affect: Mood normal.        Behavior: Behavior normal.      No results found for any visits on 03/01/22.    The 10-year ASCVD risk score (Arnett DK, et al., 2019) is: 12%    Assessment & Plan:   Problem List Items Addressed This Visit       Musculoskeletal and Integument   Primary osteoarthritis of left hip - Primary   Relevant Medications   meloxicam (MOBIC) 15 MG tablet    Return Due for physical exam. Follow up with orthopedics as planned..  We will continue tramadol as directed by Ortho.  Have added higher dose of meloxicam mentation.  Hip replacement scheduled for January 24.  January 26, MD

## 2022-03-04 ENCOUNTER — Other Ambulatory Visit: Payer: Self-pay | Admitting: Orthopaedic Surgery

## 2022-03-15 ENCOUNTER — Other Ambulatory Visit: Payer: Self-pay

## 2022-03-16 ENCOUNTER — Other Ambulatory Visit: Payer: Self-pay | Admitting: Orthopaedic Surgery

## 2022-04-07 NOTE — Pre-Procedure Instructions (Signed)
Surgical Instructions    Your procedure is scheduled on Monday, January 15.  Report to [facility] Main Entrance "A" at 10:00 A.M., then check in with the Admitting office.  Call this number if you have problems the morning of surgery:  [phone]   If you have any questions prior to your surgery date call [phone]: Open Monday-Friday 8am-4pm If you experience any cold or flu symptoms such as cough, fever, chills, shortness of breath, etc. between now and your scheduled surgery, please notify Randy Park at the above number     Remember:  Do not eat after midnight the night before your surgery  You may drink clear liquids until 9:30AM the morning of your surgery.   Clear liquids allowed are: Water, Non-Citrus Juices (without pulp), Carbonated Beverages, Clear Tea, Black Coffee ONLY (NO MILK, CREAM OR POWDERED CREAMER of any kind), and Gatorade   Patient Instructions  The night before surgery:  No food after midnight. ONLY clear liquids after midnight  The day of surgery (if you do NOT have diabetes):  Drink ONE (1) Pre-Surgery Clear Ensure by 9:30AM the morning of surgery. Drink in one sitting. Do not sip.  This drink was given to you during your hospital  pre-op appointment visit.  Nothing else to drink after completing the  Pre-Surgery Clear Ensure.           If you have questions, please contact your surgeon's office.   Take these medicines the morning of surgery with A SIP OF WATER:  tamsulosin (FLOMAX)  atorvastatin (LIPITOR)  cetirizine (ZYRTEC)  if needed  As of today, STOP taking any Aspirin (unless otherwise instructed by your surgeon) Aleve, Naproxen, Ibuprofen, Motrin, Advil, Goody's, BC's, all herbal medications, fish oil, and all vitamins.            [facility] is not responsible for any belongings or valuables.    Do NOT Smoke (Tobacco/Vaping)  24 hours prior to your procedure  If you use a CPAP at night, you may bring your mask for your overnight stay.    Contacts, glasses, hearing aids, dentures or partials may not be worn into surgery, please bring cases for these belongings   For patients admitted to the hospital, discharge time will be determined by your treatment team.   Patients discharged the day of surgery will not be allowed to drive home, and someone needs to stay with them for 24 hours.   SURGICAL WAITING ROOM VISITATION Patients having surgery or a procedure may have no more than 2 support people in the waiting area - these visitors may rotate.   Children under the age of 38 must have an adult with them who is not the patient. If the patient needs to stay at the hospital during part of their recovery, the visitor guidelines for inpatient rooms apply. Pre-op nurse will coordinate an appropriate time for 1 support person to accompany patient in pre-op.  This support person may not rotate.   Please refer to [email or web address] for the visitor guidelines for Inpatients (after your surgery is over and you are in a regular room).    Special instructions:    Oral Hygiene is also important to reduce your risk of infection.  Remember - BRUSH YOUR TEETH THE MORNING OF SURGERY WITH YOUR REGULAR TOOTHPASTE   [facility]- Preparing For Surgery  Before surgery, you can play an important role. Because skin is not sterile, your skin needs to be as free of germs as possible. You can reduce  the number of germs on your skin by washing with CHG (chlorahexidine gluconate) Soap before surgery.  CHG is an antiseptic cleaner which kills germs and bonds with the skin to continue killing germs even after washing.     Please do not use if you have an allergy to CHG or antibacterial soaps. If your skin becomes reddened/irritated stop using the CHG.  Do not shave (including legs and underarms) for at least 48 hours prior to first CHG shower. It is OK to shave your face.  Please follow these instructions  carefully.     Shower the NIGHT BEFORE SURGERY and the MORNING OF SURGERY with CHG Soap.   If you chose to wash your hair, wash your hair first as usual with your normal shampoo. After you shampoo, rinse your hair and body thoroughly to remove the shampoo.  Then [employer] and genitals (private parts) with your normal soap and rinse thoroughly to remove soap.  After that Use CHG Soap as you would any other liquid soap. You can apply CHG directly to the skin and wash gently with a scrungie or a clean washcloth.   Apply the CHG Soap to your body ONLY FROM THE NECK DOWN.  Do not use on open wounds or open sores. Avoid contact with your eyes, ears, mouth and genitals (private parts). Wash Face and genitals (private parts)  with your normal soap.   Wash thoroughly, paying special attention to the area where your surgery will be performed.  Thoroughly rinse your body with warm water from the neck down.  DO NOT shower/wash with your normal soap after using and rinsing off the CHG Soap.  Pat yourself dry with a CLEAN TOWEL.  Wear CLEAN PAJAMAS to bed the night before surgery  Place CLEAN SHEETS on your bed the night before your surgery  DO NOT SLEEP WITH PETS.   Day of Surgery:  Take a shower with CHG soap. Wear Clean/Comfortable clothing the morning of surgery Do not wear jewelry or makeup. Do not wear lotions, powders, perfumes/cologne or deodorant. Do not shave 48 hours prior to surgery.  Men may shave face and neck. Do not bring valuables to the hospital. Do not wear nail polish, gel polish, artificial nails, or any other type of covering on natural nails (fingers and toes) If you have artificial nails or gel coating that need to be removed by a nail salon, please have this removed prior to surgery. Artificial nails or gel coating may interfere with anesthesia's ability to adequately monitor your vital signs. Remember to brush your teeth WITH YOUR REGULAR TOOTHPASTE.    If you  received a COVID test during your pre-op visit, it is requested that you wear a mask when out in public, stay away from anyone that may not be feeling well, and notify your surgeon if you develop symptoms. If you have been in contact with anyone that has tested positive in the last 10 days, please notify your surgeon.    Please read over the following fact sheets that you were given.

## 2022-04-08 ENCOUNTER — Encounter (HOSPITAL_COMMUNITY): Payer: Self-pay

## 2022-04-08 ENCOUNTER — Other Ambulatory Visit: Payer: Self-pay

## 2022-04-08 ENCOUNTER — Encounter (HOSPITAL_COMMUNITY)
Admission: RE | Admit: 2022-04-08 | Discharge: 2022-04-08 | Disposition: A | Discharge status: Home / Self Care | Payer: 59 | Source: Ambulatory Visit | Attending: Orthopaedic Surgery | Admitting: Orthopaedic Surgery

## 2022-04-08 VITALS — BP 121/69 | HR 88 | Temp 97.4°F | Resp 18 | Ht 71.0 in | Wt 185.5 lb

## 2022-04-08 DIAGNOSIS — M1612 Unilateral primary osteoarthritis, left hip: Secondary | ICD-10-CM | POA: Insufficient documentation

## 2022-04-08 DIAGNOSIS — Z01818 Encounter for other preprocedural examination: Secondary | ICD-10-CM

## 2022-04-08 DIAGNOSIS — Z01812 Encounter for preprocedural laboratory examination: Secondary | ICD-10-CM | POA: Insufficient documentation

## 2022-04-08 LAB — SURGICAL PCR SCREEN
MRSA, PCR: NEGATIVE
Staphylococcus aureus: NEGATIVE

## 2022-04-08 LAB — CBC
HCT: 44.6 % (ref 39.0–52.0)
Hemoglobin: 14.9 g/dL (ref 13.0–17.0)
MCH: 32.3 pg (ref 26.0–34.0)
MCHC: 33.4 g/dL (ref 30.0–36.0)
MCV: 96.7 fL (ref 80.0–100.0)
Platelets: 348 10*3/uL (ref 150–400)
RBC: 4.61 MIL/uL (ref 4.22–5.81)
RDW: 12.8 % (ref 11.5–15.5)
WBC: 6.2 10*3/uL (ref 4.0–10.5)
nRBC: 0 % (ref 0.0–0.2)

## 2022-04-08 LAB — BASIC METABOLIC PANEL
Anion gap: 11 (ref 5–15)
BUN: 15 mg/dL (ref 6–20)
CO2: 23 mmol/L (ref 22–32)
Calcium: 8.9 mg/dL (ref 8.9–10.3)
Chloride: 104 mmol/L (ref 98–111)
Creatinine, Ser: 1.26 mg/dL — ABNORMAL HIGH (ref 0.61–1.24)
GFR, Estimated: >60 mL/min (ref >60)
Glucose, Bld: 99 mg/dL (ref 70–99)
Potassium: 3.8 mmol/L (ref 3.5–5.1)
Sodium: 138 mmol/L (ref 135–145)

## 2022-04-08 LAB — TYPE AND SCREEN
ABO/RH(D): O POS
Antibody Screen: NEGATIVE

## 2022-04-08 NOTE — Progress Notes (Signed)
PCP - Dr. Abelino Derrick Cardiologist - denies  PPM/ICD - denies   Chest x-ray - 06/03/17 EKG - 06/03/17 Stress Test - denies ECHO - denies Cardiac Cath - denies  Sleep Study - 10 years ago per pt, OSA- CPAP - n/a  DM- denies  Last dose of GLP1 agonist-  n/a   ASA/Blood Thinner Instructions: n/a   ERAS Protcol - yes PRE-SURGERY Ensure given at PAT   COVID TEST- n/a   Anesthesia review: no  Patient denies shortness of breath, fever, cough and chest pain at PAT appointment   All instructions explained to the patient, with a verbal understanding of the material. Patient agrees to go over the instructions while at home for a better understanding.  The opportunity to ask questions was provided.

## 2022-04-11 ENCOUNTER — Other Ambulatory Visit (HOSPITAL_COMMUNITY): Payer: Self-pay | Admitting: Physician Assistant

## 2022-04-19 ENCOUNTER — Other Ambulatory Visit: Payer: Self-pay | Admitting: Physician Assistant

## 2022-04-19 ENCOUNTER — Ambulatory Visit (HOSPITAL_COMMUNITY): Payer: 59 | Admitting: Certified Registered"

## 2022-04-19 ENCOUNTER — Encounter (HOSPITAL_COMMUNITY)
Admission: RE | Disposition: A | Discharge status: Home Health | Payer: Self-pay | Source: Home / Self Care | Attending: Orthopaedic Surgery

## 2022-04-19 ENCOUNTER — Other Ambulatory Visit: Payer: Self-pay

## 2022-04-19 ENCOUNTER — Observation Stay (HOSPITAL_COMMUNITY)
Admission: RE | Admit: 2022-04-19 | Discharge: 2022-04-20 | Disposition: A | Discharge status: Home Health | Payer: 59 | Attending: Orthopaedic Surgery | Admitting: Orthopaedic Surgery

## 2022-04-19 ENCOUNTER — Ambulatory Visit (HOSPITAL_COMMUNITY): Payer: 59

## 2022-04-19 ENCOUNTER — Encounter (HOSPITAL_COMMUNITY): Payer: Self-pay | Admitting: Orthopaedic Surgery

## 2022-04-19 ENCOUNTER — Ambulatory Visit (HOSPITAL_BASED_OUTPATIENT_CLINIC_OR_DEPARTMENT_OTHER): Payer: 59 | Admitting: Certified Registered"

## 2022-04-19 ENCOUNTER — Observation Stay (HOSPITAL_COMMUNITY): Payer: 59

## 2022-04-19 DIAGNOSIS — M1612 Unilateral primary osteoarthritis, left hip: Principal | ICD-10-CM | POA: Diagnosis present

## 2022-04-19 DIAGNOSIS — Z79899 Other long term (current) drug therapy: Secondary | ICD-10-CM | POA: Insufficient documentation

## 2022-04-19 DIAGNOSIS — M2419 Other articular cartilage disorders, other specified site: Secondary | ICD-10-CM

## 2022-04-19 DIAGNOSIS — F17210 Nicotine dependence, cigarettes, uncomplicated: Secondary | ICD-10-CM | POA: Diagnosis not present

## 2022-04-19 DIAGNOSIS — Z96642 Presence of left artificial hip joint: Secondary | ICD-10-CM

## 2022-04-19 HISTORY — PX: TOTAL HIP ARTHROPLASTY: SHX124

## 2022-04-19 SURGERY — ARTHROPLASTY, HIP, TOTAL, ANTERIOR APPROACH
Anesthesia: General | Site: Hip | Laterality: Left

## 2022-04-19 MED ORDER — SODIUM CHLORIDE 0.9 % IV SOLN: INTRAVENOUS | Status: DC

## 2022-04-19 MED ORDER — MIDAZOLAM HCL 2 MG/2ML IJ SOLN
INTRAMUSCULAR | Status: AC
  Filled 2022-04-19: qty 2

## 2022-04-19 MED ORDER — PRONTOSAN WOUND IRRIGATION OPTIME
TOPICAL | Status: DC | PRN
  Administered 2022-04-19: 1

## 2022-04-19 MED ORDER — METOCLOPRAMIDE HCL 5 MG PO TABS: 5.0000 mg | ORAL_TABLET | Freq: Three times a day (TID) | ORAL | Status: DC | PRN

## 2022-04-19 MED ORDER — PROPOFOL 10 MG/ML IV BOLUS
INTRAVENOUS | Status: AC
  Filled 2022-04-19: qty 20

## 2022-04-19 MED ORDER — POLYETHYLENE GLYCOL 3350 17 G PO PACK
17.0000 g | PACK | Freq: Every day | ORAL | Status: DC
  Administered 2022-04-19 – 2022-04-20 (×2): 17 g via ORAL
  Filled 2022-04-19 (×2): qty 1

## 2022-04-19 MED ORDER — MAGNESIUM CITRATE PO SOLN: 1.0000 | Freq: Once | ORAL | Status: DC | PRN

## 2022-04-19 MED ORDER — OXYCODONE HCL 5 MG PO TABS: 5.0000 mg | ORAL_TABLET | Freq: Once | ORAL | Status: DC | PRN

## 2022-04-19 MED ORDER — MENTHOL 3 MG MT LOZG: 1.0000 | LOZENGE | OROMUCOSAL | Status: DC | PRN

## 2022-04-19 MED ORDER — KETOROLAC TROMETHAMINE 10 MG PO TABS
10.0000 mg | ORAL_TABLET | Freq: Once | ORAL | Status: AC
  Administered 2022-04-19: 10 mg via ORAL
  Filled 2022-04-19: qty 1

## 2022-04-19 MED ORDER — BUPIVACAINE-MELOXICAM ER 400-12 MG/14ML IJ SOLN
INTRAMUSCULAR | Status: DC | PRN
  Administered 2022-04-19: 400 mg

## 2022-04-19 MED ORDER — ASPIRIN 81 MG PO CHEW
81.0000 mg | CHEWABLE_TABLET | Freq: Two times a day (BID) | ORAL | Status: DC
  Administered 2022-04-19 – 2022-04-20 (×2): 81 mg via ORAL
  Filled 2022-04-19 (×2): qty 1

## 2022-04-19 MED ORDER — TRANEXAMIC ACID 1000 MG/10ML IV SOLN
INTRAVENOUS | Status: DC | PRN
  Administered 2022-04-19: 2000 mg via TOPICAL

## 2022-04-19 MED ORDER — ROCURONIUM BROMIDE 10 MG/ML (PF) SYRINGE
PREFILLED_SYRINGE | INTRAVENOUS | Status: DC | PRN
  Administered 2022-04-19 (×2): 50 mg via INTRAVENOUS

## 2022-04-19 MED ORDER — ONDANSETRON HCL 4 MG PO TABS: 4.0000 mg | ORAL_TABLET | Freq: Four times a day (QID) | ORAL | Status: DC | PRN

## 2022-04-19 MED ORDER — OXYCODONE HCL ER 10 MG PO T12A
10.0000 mg | EXTENDED_RELEASE_TABLET | Freq: Once | ORAL | Status: AC
  Administered 2022-04-19: 10 mg via ORAL
  Filled 2022-04-19: qty 1

## 2022-04-19 MED ORDER — OXYCODONE HCL 5 MG PO TABS
10.0000 mg | ORAL_TABLET | ORAL | Status: DC | PRN
  Administered 2022-04-19: 10 mg via ORAL
  Administered 2022-04-20: 15 mg via ORAL
  Filled 2022-04-19: qty 3

## 2022-04-19 MED ORDER — 0.9 % SODIUM CHLORIDE (POUR BTL) OPTIME
TOPICAL | Status: DC | PRN
  Administered 2022-04-19: 1000 mL

## 2022-04-19 MED ORDER — OXYCODONE HCL ER 10 MG PO T12A
10.0000 mg | EXTENDED_RELEASE_TABLET | Freq: Two times a day (BID) | ORAL | Status: DC
  Administered 2022-04-19 – 2022-04-20 (×2): 10 mg via ORAL
  Filled 2022-04-19 (×2): qty 1

## 2022-04-19 MED ORDER — TAMSULOSIN HCL 0.4 MG PO CAPS
0.4000 mg | ORAL_CAPSULE | Freq: Every day | ORAL | Status: DC
  Administered 2022-04-20: 0.4 mg via ORAL
  Filled 2022-04-19: qty 1

## 2022-04-19 MED ORDER — ONDANSETRON HCL 4 MG PO TABS: 4.0000 mg | ORAL_TABLET | Freq: Every day | ORAL | 1 refills | Status: DC | PRN

## 2022-04-19 MED ORDER — SUGAMMADEX SODIUM 200 MG/2ML IV SOLN
INTRAVENOUS | Status: DC | PRN
  Administered 2022-04-19: 200 mg via INTRAVENOUS

## 2022-04-19 MED ORDER — PROMETHAZINE HCL 25 MG/ML IJ SOLN: 6.2500 mg | INTRAMUSCULAR | Status: DC | PRN

## 2022-04-19 MED ORDER — SODIUM CHLORIDE 0.9 % IR SOLN
Status: DC | PRN
  Administered 2022-04-19: 1000 mL

## 2022-04-19 MED ORDER — OXYCODONE HCL 5 MG PO TABS: 5.0000 mg | ORAL_TABLET | ORAL | Status: DC | PRN

## 2022-04-19 MED ORDER — ROCURONIUM BROMIDE 10 MG/ML (PF) SYRINGE
PREFILLED_SYRINGE | INTRAVENOUS | Status: AC
  Filled 2022-04-19: qty 10

## 2022-04-19 MED ORDER — POVIDONE-IODINE 10 % EX SWAB
2.0000 | Freq: Once | CUTANEOUS | Status: AC
  Administered 2022-04-19: 2 via TOPICAL

## 2022-04-19 MED ORDER — PROPOFOL 10 MG/ML IV BOLUS
INTRAVENOUS | Status: DC | PRN
  Administered 2022-04-19: 100 mg via INTRAVENOUS
  Administered 2022-04-19: 50 mg via INTRAVENOUS

## 2022-04-19 MED ORDER — HYDROMORPHONE HCL 1 MG/ML IJ SOLN
INTRAMUSCULAR | Status: AC
  Filled 2022-04-19: qty 1

## 2022-04-19 MED ORDER — BUPIVACAINE-MELOXICAM ER 400-12 MG/14ML IJ SOLN
INTRAMUSCULAR | Status: AC
  Filled 2022-04-19: qty 1

## 2022-04-19 MED ORDER — HYDROMORPHONE HCL 1 MG/ML IJ SOLN
INTRAMUSCULAR | Status: AC
  Filled 2022-04-19: qty 0.5

## 2022-04-19 MED ORDER — METHOCARBAMOL 500 MG PO TABS
500.0000 mg | ORAL_TABLET | Freq: Four times a day (QID) | ORAL | Status: DC | PRN
  Administered 2022-04-19 – 2022-04-20 (×3): 500 mg via ORAL
  Filled 2022-04-19 (×2): qty 1

## 2022-04-19 MED ORDER — HYDROMORPHONE HCL 1 MG/ML IJ SOLN
0.5000 mg | INTRAMUSCULAR | Status: DC | PRN
  Administered 2022-04-19: 1 mg via INTRAVENOUS

## 2022-04-19 MED ORDER — OXYCODONE HCL 5 MG/5ML PO SOLN: 5.0000 mg | Freq: Once | ORAL | Status: DC | PRN

## 2022-04-19 MED ORDER — DOCUSATE SODIUM 100 MG PO CAPS
100.0000 mg | ORAL_CAPSULE | Freq: Two times a day (BID) | ORAL | Status: DC
  Administered 2022-04-19 – 2022-04-20 (×2): 100 mg via ORAL
  Filled 2022-04-19 (×2): qty 1

## 2022-04-19 MED ORDER — DEXAMETHASONE SODIUM PHOSPHATE 10 MG/ML IJ SOLN
10.0000 mg | Freq: Once | INTRAMUSCULAR | Status: AC
  Administered 2022-04-20: 10 mg via INTRAVENOUS
  Filled 2022-04-19: qty 1

## 2022-04-19 MED ORDER — DOCUSATE SODIUM 100 MG PO CAPS: 100.0000 mg | ORAL_CAPSULE | Freq: Every day | ORAL | 2 refills | Status: DC | PRN

## 2022-04-19 MED ORDER — ALUM & MAG HYDROXIDE-SIMETH 200-200-20 MG/5ML PO SUSP: 30.0000 mL | ORAL | Status: DC | PRN

## 2022-04-19 MED ORDER — METHOCARBAMOL 1000 MG/10ML IJ SOLN: 500.0000 mg | Freq: Four times a day (QID) | INTRAVENOUS | Status: DC | PRN

## 2022-04-19 MED ORDER — LACTATED RINGERS IV SOLN: INTRAVENOUS | Status: DC

## 2022-04-19 MED ORDER — METHOCARBAMOL 500 MG PO TABS
ORAL_TABLET | ORAL | Status: AC
  Filled 2022-04-19: qty 1

## 2022-04-19 MED ORDER — VANCOMYCIN HCL 1 G IV SOLR
INTRAVENOUS | Status: DC | PRN
  Administered 2022-04-19: 1000 mg via TOPICAL

## 2022-04-19 MED ORDER — ACETAMINOPHEN 325 MG PO TABS: 325.0000 mg | ORAL_TABLET | Freq: Four times a day (QID) | ORAL | Status: DC | PRN

## 2022-04-19 MED ORDER — ACETAMINOPHEN 500 MG PO TABS
1000.0000 mg | ORAL_TABLET | Freq: Four times a day (QID) | ORAL | Status: DC
  Administered 2022-04-19 – 2022-04-20 (×3): 1000 mg via ORAL
  Filled 2022-04-19 (×3): qty 2

## 2022-04-19 MED ORDER — DEXAMETHASONE SODIUM PHOSPHATE 10 MG/ML IJ SOLN
INTRAMUSCULAR | Status: AC
  Filled 2022-04-19: qty 1

## 2022-04-19 MED ORDER — ONDANSETRON HCL 4 MG/2ML IJ SOLN: 4.0000 mg | Freq: Four times a day (QID) | INTRAMUSCULAR | Status: DC | PRN

## 2022-04-19 MED ORDER — PANTOPRAZOLE SODIUM 40 MG PO TBEC
40.0000 mg | DELAYED_RELEASE_TABLET | Freq: Every day | ORAL | Status: DC
  Administered 2022-04-19 – 2022-04-20 (×2): 40 mg via ORAL
  Filled 2022-04-19 (×2): qty 1

## 2022-04-19 MED ORDER — AMISULPRIDE (ANTIEMETIC) 5 MG/2ML IV SOLN: 10.0000 mg | Freq: Once | INTRAVENOUS | Status: DC | PRN

## 2022-04-19 MED ORDER — CHLORHEXIDINE GLUCONATE 0.12 % MT SOLN
15.0000 mL | Freq: Once | OROMUCOSAL | Status: AC
  Administered 2022-04-19: 15 mL via OROMUCOSAL
  Filled 2022-04-19: qty 15

## 2022-04-19 MED ORDER — VANCOMYCIN HCL 1000 MG IV SOLR
INTRAVENOUS | Status: AC
  Filled 2022-04-19: qty 20

## 2022-04-19 MED ORDER — DEXAMETHASONE SODIUM PHOSPHATE 10 MG/ML IJ SOLN
INTRAMUSCULAR | Status: DC | PRN
  Administered 2022-04-19: 10 mg via INTRAVENOUS

## 2022-04-19 MED ORDER — ACETAMINOPHEN 500 MG PO TABS
1000.0000 mg | ORAL_TABLET | Freq: Once | ORAL | Status: AC
  Administered 2022-04-19: 1000 mg via ORAL
  Filled 2022-04-19: qty 2

## 2022-04-19 MED ORDER — FENTANYL CITRATE (PF) 100 MCG/2ML IJ SOLN
INTRAMUSCULAR | Status: AC
  Filled 2022-04-19: qty 2

## 2022-04-19 MED ORDER — LIDOCAINE 2% (20 MG/ML) 5 ML SYRINGE
INTRAMUSCULAR | Status: DC | PRN
  Administered 2022-04-19: 60 mg via INTRAVENOUS

## 2022-04-19 MED ORDER — LIDOCAINE 2% (20 MG/ML) 5 ML SYRINGE
INTRAMUSCULAR | Status: AC
  Filled 2022-04-19: qty 5

## 2022-04-19 MED ORDER — TRANEXAMIC ACID 1000 MG/10ML IV SOLN
2000.0000 mg | INTRAVENOUS | Status: DC
  Filled 2022-04-19: qty 20

## 2022-04-19 MED ORDER — FERROUS SULFATE 325 (65 FE) MG PO TABS
325.0000 mg | ORAL_TABLET | Freq: Three times a day (TID) | ORAL | Status: DC
  Administered 2022-04-19 – 2022-04-20 (×2): 325 mg via ORAL
  Filled 2022-04-19 (×2): qty 1

## 2022-04-19 MED ORDER — SORBITOL 70 % SOLN: 30.0000 mL | Freq: Every day | Status: DC | PRN

## 2022-04-19 MED ORDER — ONDANSETRON HCL 4 MG/2ML IJ SOLN
INTRAMUSCULAR | Status: DC | PRN
  Administered 2022-04-19: 4 mg via INTRAVENOUS

## 2022-04-19 MED ORDER — FENTANYL CITRATE (PF) 100 MCG/2ML IJ SOLN
25.0000 ug | INTRAMUSCULAR | Status: DC | PRN
  Administered 2022-04-19 (×2): 50 ug via INTRAVENOUS
  Administered 2022-04-19: 25 ug via INTRAVENOUS

## 2022-04-19 MED ORDER — MIDAZOLAM HCL 2 MG/2ML IJ SOLN
INTRAMUSCULAR | Status: DC | PRN
  Administered 2022-04-19: 2 mg via INTRAVENOUS

## 2022-04-19 MED ORDER — CEFAZOLIN SODIUM-DEXTROSE 2-4 GM/100ML-% IV SOLN
2.0000 g | Freq: Four times a day (QID) | INTRAVENOUS | Status: AC
  Administered 2022-04-19 (×2): 2 g via INTRAVENOUS
  Filled 2022-04-19 (×2): qty 100

## 2022-04-19 MED ORDER — ONDANSETRON HCL 4 MG/2ML IJ SOLN
INTRAMUSCULAR | Status: AC
  Filled 2022-04-19: qty 2

## 2022-04-19 MED ORDER — TRANEXAMIC ACID-NACL 1000-0.7 MG/100ML-% IV SOLN
1000.0000 mg | INTRAVENOUS | Status: AC
  Administered 2022-04-19: 1000 mg via INTRAVENOUS
  Filled 2022-04-19: qty 100

## 2022-04-19 MED ORDER — PHENOL 1.4 % MT LIQD: 1.0000 | OROMUCOSAL | Status: DC | PRN

## 2022-04-19 MED ORDER — TRANEXAMIC ACID-NACL 1000-0.7 MG/100ML-% IV SOLN
1000.0000 mg | Freq: Once | INTRAVENOUS | Status: AC
  Administered 2022-04-19: 1000 mg via INTRAVENOUS
  Filled 2022-04-19: qty 100

## 2022-04-19 MED ORDER — FENTANYL CITRATE (PF) 250 MCG/5ML IJ SOLN
INTRAMUSCULAR | Status: AC
  Filled 2022-04-19: qty 5

## 2022-04-19 MED ORDER — OXYCODONE HCL 5 MG PO TABS
ORAL_TABLET | ORAL | Status: AC
  Filled 2022-04-19: qty 2

## 2022-04-19 MED ORDER — CEFAZOLIN SODIUM-DEXTROSE 2-4 GM/100ML-% IV SOLN
2.0000 g | INTRAVENOUS | Status: AC
  Administered 2022-04-19: 2 g via INTRAVENOUS
  Filled 2022-04-19: qty 100

## 2022-04-19 MED ORDER — DIPHENHYDRAMINE HCL 12.5 MG/5ML PO ELIX: 25.0000 mg | ORAL_SOLUTION | ORAL | Status: DC | PRN

## 2022-04-19 MED ORDER — METHOCARBAMOL 750 MG PO TABS: 750.0000 mg | ORAL_TABLET | Freq: Two times a day (BID) | ORAL | 2 refills | Status: DC | PRN

## 2022-04-19 MED ORDER — ORAL CARE MOUTH RINSE: 15.0000 mL | Freq: Once | OROMUCOSAL | Status: AC

## 2022-04-19 MED ORDER — HYDROMORPHONE HCL 1 MG/ML IJ SOLN
INTRAMUSCULAR | Status: DC | PRN
  Administered 2022-04-19: .5 mg via INTRAVENOUS

## 2022-04-19 MED ORDER — METOCLOPRAMIDE HCL 5 MG/ML IJ SOLN: 5.0000 mg | Freq: Three times a day (TID) | INTRAMUSCULAR | Status: DC | PRN

## 2022-04-19 MED ORDER — FENTANYL CITRATE (PF) 250 MCG/5ML IJ SOLN
INTRAMUSCULAR | Status: DC | PRN
  Administered 2022-04-19: 50 ug via INTRAVENOUS
  Administered 2022-04-19 (×2): 100 ug via INTRAVENOUS

## 2022-04-19 MED ORDER — OXYCODONE-ACETAMINOPHEN 5-325 MG PO TABS: 1.0000 | ORAL_TABLET | Freq: Four times a day (QID) | ORAL | 0 refills | Status: DC | PRN

## 2022-04-19 MED ORDER — ASPIRIN 81 MG PO TBEC: 81.0000 mg | DELAYED_RELEASE_TABLET | Freq: Two times a day (BID) | ORAL | 0 refills | Status: DC

## 2022-04-19 SURGICAL SUPPLY — 70 items
ACETAB CUP W/GRIPTION 54 (Plate) ×1 IMPLANT
ADH SKN CLS APL DERMABOND .7 (GAUZE/BANDAGES/DRESSINGS) ×1
BAG COUNTER SPONGE SURGICOUNT (BAG) ×2 IMPLANT
BAG DECANTER FOR FLEXI CONT (MISCELLANEOUS) ×2 IMPLANT
BAG SPNG CNTER NS LX DISP (BAG) ×1
BLADE SAW SAG 90X13X1.27 (BLADE) IMPLANT
CELLS DAT CNTRL 66122 CELL SVR (MISCELLANEOUS) IMPLANT
CLSR STERI-STRIP ANTIMIC 1/2X4 (GAUZE/BANDAGES/DRESSINGS) IMPLANT
COOLER ICEMAN CLASSIC (MISCELLANEOUS) IMPLANT
COVER PERINEAL POST (MISCELLANEOUS) ×2 IMPLANT
COVER SURGICAL LIGHT HANDLE (MISCELLANEOUS) ×2 IMPLANT
CUP ACETAB W/GRIPTION 54 (Plate) IMPLANT
DERMABOND ADVANCED .7 DNX12 (GAUZE/BANDAGES/DRESSINGS) IMPLANT
DRAPE C-ARM 42X72 X-RAY (DRAPES) ×2 IMPLANT
DRAPE POUCH INSTRU U-SHP 10X18 (DRAPES) ×2 IMPLANT
DRAPE STERI IOBAN 125X83 (DRAPES) ×2 IMPLANT
DRAPE U-SHAPE 47X51 STRL (DRAPES) ×4 IMPLANT
DRSG AQUACEL AG ADV 3.5X10 (GAUZE/BANDAGES/DRESSINGS) ×2 IMPLANT
DURAPREP 26ML APPLICATOR (WOUND CARE) ×4 IMPLANT
ELECT BLADE 4.0 EZ CLEAN MEGAD (MISCELLANEOUS) ×1
ELECT REM PT RETURN 9FT ADLT (ELECTROSURGICAL) ×1
ELECTRODE BLDE 4.0 EZ CLN MEGD (MISCELLANEOUS) ×2 IMPLANT
ELECTRODE REM PT RTRN 9FT ADLT (ELECTROSURGICAL) ×2 IMPLANT
GLOVE BIOGEL PI IND STRL 7.0 (GLOVE) ×4 IMPLANT
GLOVE BIOGEL PI IND STRL 7.5 (GLOVE) ×10 IMPLANT
GLOVE ECLIPSE 7.0 STRL STRAW (GLOVE) ×4 IMPLANT
GLOVE SKINSENSE STRL SZ7.5 (GLOVE) ×2 IMPLANT
GLOVE SURG SYN 7.5  E (GLOVE) ×2
GLOVE SURG SYN 7.5 E (GLOVE) ×2 IMPLANT
GLOVE SURG SYN 7.5 PF PI (GLOVE) ×4 IMPLANT
GLOVE SURG UNDER POLY LF SZ7 (GLOVE) ×6 IMPLANT
GLOVE SURG UNDER POLY LF SZ7.5 (GLOVE) ×4 IMPLANT
GOWN STRL REIN XL XLG (GOWN DISPOSABLE) ×2 IMPLANT
GOWN STRL REUS W/ TWL LRG LVL3 (GOWN DISPOSABLE) IMPLANT
GOWN STRL REUS W/ TWL XL LVL3 (GOWN DISPOSABLE) ×2 IMPLANT
GOWN STRL REUS W/TWL LRG LVL3 (GOWN DISPOSABLE)
GOWN STRL REUS W/TWL XL LVL3 (GOWN DISPOSABLE) ×1
GOWN TOGA ZIPPER T7+ PEEL AWAY (MISCELLANEOUS) ×4 IMPLANT
HANDPIECE INTERPULSE COAX TIP (DISPOSABLE) ×1
HEAD CERAMIC DELTA 36 PLUS 1.5 (Hips) IMPLANT
HOOD PEEL AWAY T7 (MISCELLANEOUS) ×2 IMPLANT
IV NS IRRIG 3000ML ARTHROMATIC (IV SOLUTION) ×2 IMPLANT
KIT BASIN OR (CUSTOM PROCEDURE TRAY) ×2 IMPLANT
LINER NEUTRAL 54X36MM PLUS 4 (Hips) IMPLANT
MARKER SKIN DUAL TIP RULER LAB (MISCELLANEOUS) ×2 IMPLANT
NDL SPNL 18GX3.5 QUINCKE PK (NEEDLE) ×2 IMPLANT
NEEDLE SPNL 18GX3.5 QUINCKE PK (NEEDLE) ×1 IMPLANT
PACK TOTAL JOINT (CUSTOM PROCEDURE TRAY) ×2 IMPLANT
PACK UNIVERSAL I (CUSTOM PROCEDURE TRAY) ×2 IMPLANT
RETRACTOR WND ALEXIS 18 MED (MISCELLANEOUS) IMPLANT
RTRCTR WOUND ALEXIS 18CM MED (MISCELLANEOUS)
SET HNDPC FAN SPRY TIP SCT (DISPOSABLE) ×2 IMPLANT
SOLUTION PRONTOSAN WOUND 350ML (IRRIGATION / IRRIGATOR) ×2 IMPLANT
STAPLER VISISTAT 35W (STAPLE) IMPLANT
STEM FEMORAL SZ5 HIGH ACTIS (Stem) IMPLANT
SUT ETHIBOND 2 V 37 (SUTURE) ×2 IMPLANT
SUT MNCRL AB 3-0 PS2 18 (SUTURE) IMPLANT
SUT VIC AB 0 CT1 27 (SUTURE) ×1
SUT VIC AB 0 CT1 27XBRD ANBCTR (SUTURE) ×2 IMPLANT
SUT VIC AB 1 CTX 36 (SUTURE) ×1
SUT VIC AB 1 CTX36XBRD ANBCTR (SUTURE) ×2 IMPLANT
SUT VIC AB 2-0 CT1 27 (SUTURE) ×2
SUT VIC AB 2-0 CT1 TAPERPNT 27 (SUTURE) ×4 IMPLANT
SYR 50ML LL SCALE MARK (SYRINGE) ×2 IMPLANT
TOWEL GREEN STERILE (TOWEL DISPOSABLE) ×2 IMPLANT
TRAY CATH 16FR W/PLASTIC CATH (SET/KITS/TRAYS/PACK) IMPLANT
TRAY FOLEY W/BAG SLVR 16FR (SET/KITS/TRAYS/PACK)
TRAY FOLEY W/BAG SLVR 16FR ST (SET/KITS/TRAYS/PACK) IMPLANT
TUBE SUCT ARGYLE STRL (TUBING) ×2 IMPLANT
YANKAUER SUCT BULB TIP NO VENT (SUCTIONS) ×2 IMPLANT

## 2022-04-19 NOTE — Anesthesia Preprocedure Evaluation (Addendum)
Anesthesia Evaluation  Patient identified by MRN, date of birth, ID band Patient awake    Reviewed: Allergy & Precautions, NPO status , Patient's Chart, lab work & pertinent test results  Airway Mallampati: II  TM Distance: >3 FB Neck ROM: Full    Dental no notable dental hx.    Pulmonary Current Smoker and Patient abstained from smoking.   Pulmonary exam normal        Cardiovascular negative cardio ROS Normal cardiovascular exam     Neuro/Psych  Headaches  negative psych ROS   GI/Hepatic negative GI ROS, Neg liver ROS,,,  Endo/Other  negative endocrine ROS    Renal/GU negative Renal ROS     Musculoskeletal  (+) Arthritis ,    Abdominal   Peds  Hematology negative hematology ROS (+)   Anesthesia Other Findings left hip degenerative joint disease  Reproductive/Obstetrics                              Anesthesia Physical Anesthesia Plan  ASA: 2  Anesthesia Plan: General   Post-op Pain Management:    Induction: Intravenous  PONV Risk Score and Plan: 1 and Ondansetron, Dexamethasone, Midazolam and Treatment may vary due to age or medical condition  Airway Management Planned: Oral ETT  Additional Equipment:   Intra-op Plan:   Post-operative Plan: Extubation in OR  Informed Consent: I have reviewed the patients History and Physical, chart, labs and discussed the procedure including the risks, benefits and alternatives for the proposed anesthesia with the patient or authorized representative who has indicated his/her understanding and acceptance.     Dental advisory given  Plan Discussed with: CRNA  Anesthesia Plan Comments:          Anesthesia Quick Evaluation

## 2022-04-19 NOTE — Op Note (Signed)
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 Total hip replacement; Left hip; uncemented [id number]   Surgeon: Frankey Shown, M.D.  Assist: Madalyn Rob, PA-C   Anesthesia: general  Prosthesis: Depuy Acetabulum: Pinnacle 54 mm Femur: Actis 5 HO Head: 36 mm size: +1.5 Liner: +4 Bearing Type: ceramic/poly  Total Hip Arthroplasty (Anterior Approach) Op Note:  After informed consent was obtained and the operative extremity marked in the holding area, the patient was brought back to the operating room and placed supine on the HANA table. Next, the operative extremity was prepped and draped in normal sterile fashion. Surgical timeout occurred verifying patient identification, surgical site, surgical procedure and administration of antibiotics.  A Hueter approach to the hip was performed, using the interval between tensor fascia lata and sartorius.  Dissection was carried bluntly down onto the anterior hip capsule. The lateral femoral circumflex vessels were identified and coagulated. A capsulotomy was performed and the capsular flaps tagged for later repair.  The neck osteotomy was performed. The femoral head was removed which showed severe wear, the acetabular rim was cleared of soft tissue and osteophytes and attention was turned to reaming the acetabulum.  Sequential reaming was performed under fluoroscopic guidance down to the floor of the cotyloid fossa. We reamed to a size 53 mm, and then impacted the acetabular shell.  The liner was then placed after irrigation and attention turned to the femur.  After placing the femoral hook, the leg was taken to externally rotated, extended and adducted position taking care to perform soft tissue releases to  allow for adequate mobilization of the femur. Soft tissue was cleared from the shoulder of the greater trochanter and the hook elevator used to improve exposure of the proximal femur. Sequential broaching performed up to a size 5. Trial neck and head were placed. The leg was brought back up to neutral and the construct reduced.  Antibiotic irrigation was placed in the surgical wound.  The position and sizing of components, offset and leg lengths were checked using fluoroscopy.  Standard offset was not stable when the leg was placed in hyperextension and 90 degrees of external rotation.  Therefore, high offset neck was chosen and this imparted stability to the joint.  Stability of the construct was checked in extension and external rotation without any subluxation, shuck or impingement of prosthesis. We dislocated the prosthesis, dropped the leg back into position, removed trial components, and irrigated copiously. The final stem and head was then placed, the leg brought back up, the system reduced and fluoroscopy used to verify positioning.  We irrigated, obtained hemostasis and closed the capsule using #2 ethibond suture.  One gram of vancomycin powder was placed in the surgical bed.   One gram of topical tranexamic acid was injected into the joint.  The fascia was closed with #1 vicryl plus, the deep fat layer was closed with 0 vicryl, the subcutaneous layers closed with 2.0 Vicryl Plus and the skin closed with 3.0 monocryl. A sterile dressing was applied. The patient was awakened in the operating room and taken to recovery in stable condition.  All sponge, needle, and instrument counts were correct at the end of the case.   Tawanna Cooler, my PA, was a medical necessity for opening, closing, limb  positioning, retracting, exposing, and overall facilitation and timely completion of the surgery.  Position: supine  Complications: see description of procedure.  Time Out: performed   Drains/Packing:  none  Estimated blood loss: see anesthesia record  Returned to Recovery Room: in good condition.   Antibiotics: yes   Mechanical VTE (DVT) Prophylaxis: sequential compression devices, TED thigh-high  Chemical VTE (DVT) Prophylaxis: aspirin   Fluid Replacement: see anesthesia record  Specimens Removed: 1 to pathology   Sponge and Instrument Count Correct? yes   PACU: portable radiograph - low AP   Plan/RTC: Return in 2 weeks for staple removal. Weight Bearing/Load Lower Extremity: full  Hip precautions: none Suture Removal: 2 weeks   N. Eduard Roux, MD Marga Hoots 1:13 PM   Implant Name Type Inv. Item Serial No. Manufacturer Lot No. LRB No. Used Action  ACETAB CUP W/GRIPTION 54 - [id number] Plate ACETAB CUP W/GRIPTION 54  [employer] [id number] Left 1 Implanted  LINER NEUTRAL 54X36MM PLUS 4 - [id number] Hips LINER NEUTRAL 54X36MM PLUS 4  [employer] [id number] Left 1 Implanted  STEM FEMORAL SZ5 HIGH ACTIS - [id number] Stem STEM FEMORAL SZ5 HIGH ACTIS  [employer] [id number] Left 1 Implanted  HEAD CERAMIC DELTA 36 PLUS 1.5 - [id number] Hips HEAD CERAMIC DELTA 36 PLUS 1.5  [employer] [id number] Left 1 Implanted

## 2022-04-19 NOTE — Anesthesia Procedure Notes (Signed)
Procedure Name: Intubation Date/Time: 04/19/2022 12:00 PM  Performed by: Lance Coon, CRNAPre-anesthesia Checklist: Patient identified, Emergency Drugs available, Suction available, Patient being monitored and Timeout performed Patient Re-evaluated:Patient Re-evaluated prior to induction Oxygen Delivery Method: Circle system utilized Preoxygenation: Pre-oxygenation with 100% oxygen Induction Type: IV induction Ventilation: Mask ventilation without difficulty Laryngoscope Size: Miller and 3 Grade View: Grade I Tube type: Oral Tube size: 7.5 mm Number of attempts: 1 Airway Equipment and Method: Stylet Placement Confirmation: positive ETCO2, breath sounds checked- equal and bilateral and ETT inserted through vocal cords under direct vision Secured at: 21 cm Tube secured with: Tape Dental Injury: Teeth and Oropharynx as per pre-operative assessment

## 2022-04-19 NOTE — Transfer of Care (Signed)
Immediate Anesthesia Transfer of Care Note  Patient: Randy Park  Procedure(s) Performed: LEFT TOTAL HIP ARTHROPLASTY ANTERIOR APPROACH (Left: Hip)  Patient Location: PACU  Anesthesia Type:General  Level of Consciousness: drowsy and patient cooperative  Airway & Oxygen Therapy: Patient Spontanous Breathing  Post-op Assessment: Report given to RN and Post -op Vital signs reviewed and stable  Post vital signs: Reviewed and stable  Last Vitals:  Vitals Value Taken Time  BP    Temp    Pulse 78 04/19/22 1344  Resp 16 04/19/22 1344  SpO2 97 % 04/19/22 1344  Vitals shown include unvalidated device data.  Last Pain:  Vitals:   04/19/22 1046  TempSrc:   PainSc: 0-No pain         Complications: No notable events documented.
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
## 2022-04-19 NOTE — Discharge Instructions (Signed)

## 2022-04-19 NOTE — Anesthesia Postprocedure Evaluation (Signed)
Anesthesia Post Note  Patient: JSIAH MENTA  Procedure(s) Performed: LEFT TOTAL HIP ARTHROPLASTY ANTERIOR APPROACH (Left: Hip)     Patient location during evaluation: PACU Anesthesia Type: General Level of consciousness: awake Pain management: pain level controlled Vital Signs Assessment: post-procedure vital signs reviewed and stable Respiratory status: spontaneous breathing, nonlabored ventilation and respiratory function stable Cardiovascular status: blood pressure returned to baseline and stable Postop Assessment: no apparent nausea or vomiting Anesthetic complications: no   No notable events documented.  Last Vitals:  Vitals:   04/19/22 1715 04/19/22 1743  BP: (!) 159/82 (!) 141/84  Pulse: 64 (!) 55  Resp: 20 17  Temp:  36.8 C  SpO2: 98% 98%    Last Pain:  Vitals:   04/19/22 1743  TempSrc: Oral  PainSc:                  Doyle Kunath P Chesley Valls

## 2022-04-20 ENCOUNTER — Encounter (HOSPITAL_COMMUNITY): Payer: Self-pay | Admitting: Orthopaedic Surgery

## 2022-04-20 DIAGNOSIS — M1612 Unilateral primary osteoarthritis, left hip: Secondary | ICD-10-CM | POA: Diagnosis not present

## 2022-04-20 LAB — CBC
HCT: 37 % — ABNORMAL LOW (ref 39.0–52.0)
Hemoglobin: 12.8 g/dL — ABNORMAL LOW (ref 13.0–17.0)
MCH: 32.5 pg (ref 26.0–34.0)
MCHC: 34.6 g/dL (ref 30.0–36.0)
MCV: 93.9 fL (ref 80.0–100.0)
Platelets: 241 10*3/uL (ref 150–400)
RBC: 3.94 MIL/uL — ABNORMAL LOW (ref 4.22–5.81)
RDW: 12.4 % (ref 11.5–15.5)
WBC: 12.1 10*3/uL — ABNORMAL HIGH (ref 4.0–10.5)
nRBC: 0 % (ref 0.0–0.2)

## 2022-04-20 NOTE — Evaluation (Signed)
Physical Therapy Evaluation Patient Details Name: Randy Park MRN: [id number] DOB: 05-16-1960 Today's Date: 04/20/2022  History of Present Illness  Pt is 62 yo male who presents on 04/19/22 for L THA, anterior direct approach. PMH: OA, GERD, colitis  Clinical Impression  Pt admitted with above diagnosis. 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 Pt moving well on eval, was able to come to EOB independently. Pt ambulated 30' with RW and supervision, became mildly dizzy at 63' and min A given last 20'. Dizziness subsided with return to supine. VSS. Pt tolerated supine, sitting, and standing there ex. Will see one more time today and pt should be ready for d/c from a PT standpoint.  Pt currently with functional limitations due to the deficits listed below (see PT Problem List). Pt will benefit from skilled PT to increase their independence and safety with mobility to allow discharge to the venue listed below.          Recommendations for follow up therapy are one component of a multi-disciplinary discharge planning process, led by the attending physician.  Recommendations may be updated based on patient status, additional functional criteria and insurance authorization.  Follow Up Recommendations Follow physician's recommendations for discharge plan and follow up therapies      Assistance Recommended at Discharge PRN  Patient can return home with the following  Assistance with cooking/housework;Assist for transportation    Equipment Recommendations Rolling walker (2 wheels)  Recommendations for Other Services       Functional Status Assessment Patient has had a recent decline in their functional status and demonstrates the ability to make significant improvements in function in a reasonable and predictable amount of time.     Precautions / Restrictions Precautions Precautions: None Restrictions Weight Bearing Restrictions: Yes LLE Weight Bearing:  Weight bearing as tolerated      Mobility  Bed Mobility Overal bed mobility: Modified Independent                  Transfers Overall transfer level: Needs assistance Equipment used: Rolling walker (2 wheels) Transfers: Sit to/from Stand Sit to Stand: Supervision           General transfer comment: vc's for hand placement    Ambulation/Gait Ambulation/Gait assistance: Supervision Gait Distance (Feet): 80 Feet Assistive device: Rolling walker (2 wheels) Gait Pattern/deviations: Step-through pattern Gait velocity: decreased Gait velocity interpretation: 1.31 - 2.62 ft/sec, indicative of limited community ambulator   General Gait Details: vc's for erect posture. Pt mildly dizzy after 60', min A back to room from that point. VSS  Stairs Stairs: Yes Stairs assistance: Supervision Stair Management: Two rails, Alternating pattern, Forwards Number of Stairs: 5 General stair comments: no difficulty with stairs and pt does not have to do for home  Wheelchair Mobility    Modified Rankin (Stroke Patients Only)       Balance Overall balance assessment: No apparent balance deficits (not formally assessed)                                           Pertinent Vitals/Pain Pain Assessment Pain Assessment: 0-10 Pain Score: 5  Pain Location: L hip Pain Descriptors / Indicators: Sore Pain Intervention(s): Limited activity within patient's tolerance, Monitored during session, Premedicated before session    Home Living Family/patient expects to be discharged to:: Private residence Living Arrangements: Spouse/significant other;Children  Available Help at Discharge: Family;Available PRN/intermittently Type of Home: Apartment Home Access: Level entry       Home Layout: One level Home Equipment: None Additional Comments: wife works 8-5p. Pt is going to daughter's house during the day, she works from home    Prior Function Prior Level of Function :  Independent/Modified Independent;Driving;Working/employed             Mobility Comments: pt has been limping on LLE x 2.5 yrs, no AD ADLs Comments: independent     Hand Dominance   Dominant Hand: Right    Extremity/Trunk Assessment   Upper Extremity Assessment Upper Extremity Assessment: Overall WFL for tasks assessed    Lower Extremity Assessment Lower Extremity Assessment: LLE deficits/detail LLE Deficits / Details: hip flex >3/5, knee ext >3/5 LLE Sensation: WNL LLE Coordination: decreased gross motor    Cervical / Trunk Assessment Cervical / Trunk Assessment: Normal  Communication   Communication: No difficulties  Cognition Arousal/Alertness: Awake/alert Behavior During Therapy: WFL for tasks assessed/performed Overall Cognitive Status: Within Functional Limits for tasks assessed                                          General Comments General comments (skin integrity, edema, etc.): educated on activity level upon return as well as elevation and positioning    Exercises Total Joint Exercises Ankle Circles/Pumps: AROM, Both, 20 reps, Supine Quad Sets: AROM, Both, 10 reps, Supine Gluteal Sets: AROM, Both, 10 reps, Supine Heel Slides: AROM, Left, 10 reps, Supine Hip ABduction/ADduction: AROM, Left, 10 reps, Supine, Standing Straight Leg Raises: AROM, Left, 10 reps, Supine Long Arc Quad: AROM, Left, 10 reps, Seated Standing Hip Extension: AROM, Left, 10 reps, Standing   Assessment/Plan    PT Assessment Patient needs continued PT services  PT Problem List Decreased mobility;Decreased knowledge of use of DME;Decreased knowledge of precautions;Pain       PT Treatment Interventions DME instruction;Gait training;Functional mobility training;Therapeutic activities;Therapeutic exercise;Neuromuscular re-education;Stair training;Patient/family education    PT Goals (Current goals can be found in the Care Plan section)  Acute Rehab PT  Goals Patient Stated Goal: return home PT Goal Formulation: With patient Time For Goal Achievement: 04/27/22 Potential to Achieve Goals: Good    Frequency 7X/week     Co-evaluation               AM-PAC PT "6 Clicks" Mobility  Outcome Measure Help needed turning from your back to your side while in a flat bed without using bedrails?: None Help needed moving from lying on your back to sitting on the side of a flat bed without using bedrails?: None Help needed moving to and from a bed to a chair (including a wheelchair)?: A Little Help needed standing up from a chair using your arms (e.g., wheelchair or bedside chair)?: A Little Help needed to walk in hospital room?: A Little Help needed climbing 3-5 steps with a railing? : A Little 6 Click Score: 20    End of Session Equipment Utilized During Treatment: Gait belt Activity Tolerance: Patient tolerated treatment well Patient left: in bed;with call bell/phone within reach;with bed alarm set Nurse Communication: Mobility status PT Visit Diagnosis: Difficulty in walking, not elsewhere classified (R26.2);Pain Pain - Right/Left: Left Pain - part of body: Hip    Time: 0852-0920 PT Time Calculation (min) (ACUTE ONLY): 28 min   Charges:   PT Evaluation $PT  Eval Low Complexity: 1 Low PT Treatments $Gait Training: 8-22 mins        Leighton Roach, PT  Acute Rehab Services Secure chat preferred Office [place] 04/20/2022, 11:55 AM

## 2022-04-20 NOTE — Progress Notes (Signed)
Physical Therapy Treatment Patient Details Name: Randy Park MRN: [id number] DOB: [birth year] Today's Date: 04/20/2022   History of Present Illness Pt is 62 yo male who presents on 04/19/22 for L THA, anterior direct approach. PMH: OA, GERD, colitis    PT Comments    Pt seen for final session. Ambulating independently with RW 250' without dizziness. VSS. Reviewed positioning and exercises. Pt with tightness in hip flexors but able to achieve erect posture, needs vc's to maintain but knows he needs to work on this. Pt ready for d/c home. PT signing off.      Recommendations for follow up therapy are one component of a multi-disciplinary discharge planning process, led by the attending physician.  Recommendations may be updated based on patient status, additional functional criteria and insurance authorization.  Follow Up Recommendations  Follow physician's recommendations for discharge plan and follow up therapies     Assistance Recommended at Discharge PRN  Patient can return home with the following Assistance with cooking/housework;Assist for transportation   Equipment Recommendations  Rolling walker (2 wheels)    Recommendations for Other Services       Precautions / Restrictions Precautions Precautions: None Restrictions Weight Bearing Restrictions: Yes LLE Weight Bearing: Weight bearing as tolerated     Mobility  Bed Mobility Overal bed mobility: Modified Independent                  Transfers Overall transfer level: Modified independent Equipment used: Rolling walker (2 wheels) Transfers: Sit to/from Stand Sit to Stand: Modified independent (Device/Increase time)           General transfer comment: standing safely to RW    Ambulation/Gait Ambulation/Gait assistance: Modified independent (Device/Increase time) Gait Distance (Feet): 250 Feet Assistive device: Rolling walker (2 wheels) Gait Pattern/deviations: Step-through pattern Gait  velocity: decreased Gait velocity interpretation: >2.62 ft/sec, indicative of community ambulatory   General Gait Details: pt mod I with ambulation, no dizziness. Occasional vc's for posture   Stairs Stairs: Yes Stairs assistance: Supervision Stair Management: Two rails, Alternating pattern, Forwards Number of Stairs: 5 General stair comments: no difficulty with stairs and pt does not have to do for home   Wheelchair Mobility    Modified Rankin (Stroke Patients Only)       Balance Overall balance assessment: No apparent balance deficits (not formally assessed)                                          Cognition Arousal/Alertness: Awake/alert Behavior During Therapy: WFL for tasks assessed/performed Overall Cognitive Status: Within Functional Limits for tasks assessed                                          Exercises Total Joint Exercises Ankle Circles/Pumps: AROM, Both, 20 reps, Supine Quad Sets: AROM, Both, 10 reps, Supine Gluteal Sets: AROM, Both, 10 reps, Supine Heel Slides: AROM, Left, 10 reps, Supine Hip ABduction/ADduction: AROM, Left, 10 reps, Standing Straight Leg Raises: AROM, Left, 10 reps, Supine Long Arc Quad: AROM, Left, Seated, 5 reps Standing Hip Extension: AROM, Left, 10 reps, Standing    General Comments General comments (skin integrity, edema, etc.): VSS. Brother present to take pt home      Pertinent Vitals/Pain Pain Assessment Pain Assessment: Faces Pain Score: 5  Faces Pain Scale: Hurts a little bit Pain Location: L hip Pain Descriptors / Indicators: Sore Pain Intervention(s): Limited activity within patient's tolerance, Monitored during session, Premedicated before session    [place] expects to be discharged to:: Private residence Living Arrangements: Spouse/significant other;Children Available Help at Discharge: Family;Available PRN/intermittently Type of Home: Apartment Home  Access: Level entry       Home Layout: One level Home Equipment: None Additional Comments: wife works 8-5p. Pt is going to daughter's house during the day, she works from home    Prior Function            PT Goals (current goals can now be found in the care plan section) Acute Rehab PT Goals Patient Stated Goal: return home PT Goal Formulation: All assessment and education complete, DC therapy Time For Goal Achievement: 04/27/22 Potential to Achieve Goals: Good Progress towards PT goals: Goals met/education completed, patient discharged from PT    Frequency    7X/week      PT Plan Current plan remains appropriate    Co-evaluation              AM-PAC PT "6 Clicks" Mobility   Outcome Measure  Help needed turning from your back to your side while in a flat bed without using bedrails?: None Help needed moving from lying on your back to sitting on the side of a flat bed without using bedrails?: None Help needed moving to and from a bed to a chair (including a wheelchair)?: A Little Help needed standing up from a chair using your arms (e.g., wheelchair or bedside chair)?: A Little Help needed to walk in hospital room?: A Little Help needed climbing 3-5 steps with a railing? : A Little 6 Click Score: 20    End of Session Equipment Utilized During Treatment: Gait belt Activity Tolerance: Patient tolerated treatment well Patient left: with call bell/phone within reach;in chair;with family/visitor present Nurse Communication: Mobility status PT Visit Diagnosis: Difficulty in walking, not elsewhere classified (R26.2);Pain Pain - Right/Left: Left Pain - part of body: Hip     Time: 4742-5956 PT Time Calculation (min) (ACUTE ONLY): 11 min  Charges:  $Gait Training: 8-22 mins                     Leighton Roach, PT  Acute Rehab Services Secure chat preferred Office [place] 04/20/2022, 1:09 PM

## 2022-04-20 NOTE — TOC Transition Note (Signed)
Transition of Care [facility]) - CM/SW Discharge Note   Patient Details  Name: KYROLLOS CORDELL MRN: [id number] Date of Birth: 08-29-1960  [facility]) CM/SW Contact:  Sharin Mons, RN Phone Number: 04/20/2022, 11:38 AM   Clinical Narrative:    Patient will DC to: home Anticipated DC date: 04/20/2022 Family notified: yes Transport by: car       - S/p LEFT TOTAL HIP ARTHROPLASTY, 1/15 Per MD patient ready for DC today pending PT clearance. RN, patient, patient's family, and Claiborne Billings with [facility]  notified of DC. DME orders noted for BSC, RW. Referral made with [facility]. [facility] to deliver equipment to bedside prior to d/c. Pt without RX med concerns. Post hospital f/u noted on AVS.  Family to provide transportation to home.  RNCM will sign off for now as intervention is no longer needed. Please consult [place] again if new needs arise.    Final next level of care: [place] Barriers to Discharge: No Barriers Identified   Patient Goals and CMS Choice   Choice offered to / list presented to : Patient  Discharge Placement                         Discharge Plan and Services Additional resources added to the After Visit Summary for                  DME Arranged: 3-N-1, Walker rolling DME Agency: [facility] Date DME Agency Contacted: 04/20/22 Time DME Agency Contacted: [id number] Representative spoke with at DME Agency: [place]: PT [place]: [place] Date [place]: 04/20/22 Time [place]: 1028 Representative spoke with at [place]: [place] Determinants of Health ([place]) Interventions SDOH Screenings   Food Insecurity: No Food Insecurity (04/19/2022)  Housing: Low Risk  (04/19/2022)  Transportation Needs: No Transportation Needs (04/19/2022)  Utilities: Not At Risk (04/19/2022)  Depression (PHQ2-9): Low Risk  (03/01/2022)  Tobacco Use: High Risk (04/19/2022)      Readmission Risk Interventions     No data to display

## 2022-04-20 NOTE — Progress Notes (Cosign Needed)
Durable Medical Equipment  (From admission, onward)           Start     Ordered   04/20/22 1026  DME 3 n 1  Once       Comments: Confined to one room   04/20/22 1025   04/19/22 1737  DME Walker rolling  Once       Question:  Patient needs a walker to treat with the following condition  Answer:  History of hip replacement   04/19/22 1736   04/19/22 1737  DME Bedside commode  Once       Question:  Patient needs a bedside commode to treat with the following condition  Answer:  History of hip replacement   04/19/22 1736

## 2022-04-20 NOTE — Progress Notes (Signed)
Subjective: 1 Day Post-Op Procedure(s) (LRB): LEFT TOTAL HIP ARTHROPLASTY ANTERIOR APPROACH (Left) Patient reports pain as moderate.    Objective: Vital signs in last 24 hours: Temp:  [97.7 F (36.5 C)-98.3 F (36.8 C)] 98.2 F (36.8 C) (01/16 5456) Pulse Rate:  [55-78] 56 (01/16 0613) Resp:  [12-20] 18 (01/16 0613) BP: (129-161)/(68-84) 139/81 (01/16 0613) SpO2:  [94 %-100 %] 95 % (01/16 2563) Weight:  [82.1 kg] 82.1 kg (01/15 1015)  Intake/Output from previous day: 01/15 0701 - 01/16 0700 In: 1500 [I.V.:1500] Out: 500 [Urine:300; Blood:200] Intake/Output this shift: No intake/output data recorded.  Recent Labs    04/20/22 0341  HGB 12.8*   Recent Labs    04/20/22 0341  WBC 12.1*  RBC 3.94*  HCT 37.0*  PLT 241   No results for input(s): "NA", "K", "CL", "CO2", "BUN", "CREATININE", "GLUCOSE", "CALCIUM" in the last 72 hours. No results for input(s): "LABPT", "INR" in the last 72 hours.  Neurologically intact Neurovascular intact Sensation intact distally Intact pulses distally Dorsiflexion/Plantar flexion intact Incision: dressing C/D/I No cellulitis present Compartment soft   Assessment/Plan: 1 Day Post-Op Procedure(s) (LRB): LEFT TOTAL HIP ARTHROPLASTY ANTERIOR APPROACH (Left) Advance diet Up with therapy D/C IV fluids Discharge home with home health once cleared by PT (likely today, but possibly tomorrow) WBAT LLE ABLA- mild and stable      Randy Park 04/20/2022, 8:01 AM

## 2022-04-20 NOTE — Discharge Summary (Signed)
Patient ID: Randy Park MRN: [id number] DOB/AGE: 1960-09-16 62 y.o.  Admit date: 04/19/2022 Discharge date: 04/20/2022  Admission Diagnoses:  Principal Problem:   Primary osteoarthritis of left hip Active Problems:   Status post total replacement of left hip   Discharge Diagnoses:  Same  Past Medical History:  Diagnosis Date   Arthritis 2023   osteoarthritis left hip   COLITIS 08/26/2007   Qualifier: Diagnosis of  By: Marland Mcalpine     Diverticulosis    GERD (gastroesophageal reflux disease)    Headache    HX MIGRAINES   Hiatal hernia    Hyperplastic colonic polyp - colonoscopy 2008 08/26/2007   Qualifier: Diagnosis of  By: Marland Mcalpine      Surgeries: Procedure(s): LEFT TOTAL HIP ARTHROPLASTY ANTERIOR APPROACH on 04/19/2022   Consultants:   Discharged Condition: Improved  Hospital Course: [name] is an 61 y.o. male who was admitted 04/19/2022 for operative treatment ofPrimary osteoarthritis of left hip. Patient has severe unremitting pain that affects sleep, daily activities, and work/hobbies. After pre-op clearance the patient was taken to the operating room on 04/19/2022 and underwent  Procedure(s): LEFT TOTAL HIP ARTHROPLASTY ANTERIOR APPROACH.    Patient was given perioperative antibiotics:  Anti-infectives (From admission, onward)    Start     Dose/Rate Route Frequency Ordered Stop   04/19/22 1830  ceFAZolin (ANCEF) IVPB 2g/100 mL premix        2 g 200 mL/hr over 30 Minutes Intravenous Every 6 hours 04/19/22 1736 04/20/22 0012   04/19/22 1141  vancomycin (VANCOCIN) powder  Status:  Discontinued          As needed 04/19/22 1142 04/19/22 1340   04/19/22 1015  ceFAZolin (ANCEF) IVPB 2g/100 mL premix        2 g 200 mL/hr over 30 Minutes Intravenous On call to O.R. 04/19/22 1010 04/19/22 1208        Patient was given sequential compression devices, early ambulation, and chemoprophylaxis to prevent DVT.  Patient benefited  maximally from hospital stay and there were no complications.    Recent vital signs: Patient Vitals for the past 24 hrs:  BP Temp Temp src Pulse Resp SpO2 Height Weight  04/20/22 0613 139/81 98.2 F (36.8 C) Oral (!) 56 18 95 % -- --  04/19/22 2222 137/81 98.3 F (36.8 C) Oral 72 18 96 % -- --  04/19/22 1743 (!) 141/84 98.2 F (36.8 C) Oral (!) 55 17 98 % -- --  04/19/22 1715 (!) 159/82 -- -- 64 20 98 % -- --  04/19/22 1645 134/79 -- -- (!) 58 12 97 % -- --  04/19/22 1615 137/77 -- -- 68 19 95 % -- --  04/19/22 1545 (!) 140/68 -- -- (!) 56 18 96 % -- --  04/19/22 1515 129/78 97.9 F (36.6 C) -- (!) 58 12 94 % -- --  04/19/22 1500 129/78 -- -- (!) 58 12 94 % -- --  04/19/22 1445 (!) 143/74 -- -- 62 13 95 % -- --  04/19/22 1430 (!) 142/78 -- -- (!) 58 13 95 % -- --  04/19/22 1415 (!) 153/71 -- -- (!) 59 12 96 % -- --  04/19/22 1400 (!) 161/77 -- -- 65 12 96 % -- --  04/19/22 1345 (!) 157/78 97.7 F (36.5 C) -- 78 16 96 % -- --  04/19/22 1015 (!) 156/81 98 F (36.7 C) Oral (!) 59 17 100 % 5\' 11"  (1.803 m) 82.1 kg  Recent laboratory studies:  Recent Labs    04/20/22 0341  WBC 12.1*  HGB 12.8*  HCT 37.0*  PLT 241     Discharge Medications:   Allergies as of 04/20/2022       Reactions   Amoxicillin Rash        Medication List     STOP taking these medications    meloxicam 15 MG tablet Commonly known as: MOBIC   traMADol 50 MG tablet Commonly known as: ULTRAM       TAKE these medications    aspirin EC 81 MG tablet Take 1 tablet (81 mg total) by mouth 2 (two) times daily. Swallow whole.   atorvastatin 10 MG tablet Commonly known as: LIPITOR Take 1 tablet (10 mg total) by mouth daily. What changed:  when to take this reasons to take this   cetirizine 10 MG tablet Commonly known as: ZYRTEC Take 10 mg by mouth daily as needed for allergies.   docusate sodium 100 MG capsule Commonly known as: Colace Take 1 capsule (100 mg total) by mouth daily as  needed.   methocarbamol 750 MG tablet Commonly known as: Robaxin-750 Take 1 tablet (750 mg total) by mouth 2 (two) times daily as needed for muscle spasms.   ondansetron 4 MG tablet Commonly known as: Zofran Take 1 tablet (4 mg total) by mouth daily as needed for nausea or vomiting.   oxyCODONE-acetaminophen 5-325 MG tablet Commonly known as: Percocet Take 1-2 tablets by mouth every 6 (six) hours as needed.   tamsulosin 0.4 MG Caps capsule Commonly known as: FLOMAX Take 0.4 mg by mouth daily.               Durable Medical Equipment  (From admission, onward)           Start     Ordered   04/19/22 1737  DME Walker rolling  Once       Question:  Patient needs a walker to treat with the following condition  Answer:  History of hip replacement   04/19/22 1736   04/19/22 1737  DME 3 n 1  Once        04/19/22 1736   04/19/22 1737  DME Bedside commode  Once       Question:  Patient needs a bedside commode to treat with the following condition  Answer:  History of hip replacement   04/19/22 1736            Diagnostic Studies: DG Pelvis Portable  Result Date: 04/19/2022 CLINICAL DATA:  Left hip replacement EXAM: PORTABLE PELVIS 1-2 VIEWS COMPARISON:  01/04/2022 FINDINGS: Interval left hip arthroplasty, components projecting in the expected location. No fracture or dislocation. IMPRESSION: Interval left hip arthroplasty, without acute findings. Electronically Signed   By: Randy Park M.D.   On: 04/19/2022 14:28   DG HIP UNILAT WITH PELVIS 1V LEFT  Result Date: 04/19/2022 CLINICAL DATA:  Left hip arthroplasty, anterior approach. EXAM: DG HIP (WITH OR WITHOUT PELVIS) 1V*L* COMPARISON:  01/04/2022. FINDINGS: Four intraoperative fluoroscopic spot views of the left hip and pelvis are provided in the frontal projection. Initial images show severe left hip osteoarthritis. Subsequent images show left total hip arthroplasty. IMPRESSION: Intraoperative visualization for left hip  arthroplasty. Electronically Signed   By: Randy Park M.D.   On: 04/19/2022 13:28   DG C-Arm 1-60 Min-No Report  Result Date: 04/19/2022 Fluoroscopy was utilized by the requesting physician.  No radiographic interpretation.   DG C-Arm 1-60 Min-No Report  Result Date: 04/19/2022 Fluoroscopy was utilized by the requesting physician.  No radiographic interpretation.    Disposition: Discharge disposition: 01-Home or Self Care          Follow-up Information     [facility].   Specialty: Home Health Services Why: the office will call to schedule home health visits Contact information: [address] [place] [address] [phone]         Tarry Kos, MD. Schedule an appointment as soon as possible for a visit in 2 week(s).   Specialty: Orthopedic Surgery Contact information: [address] [place] [address] [phone]                  Signed: Cristie Hem 04/20/2022, 8:02 AM

## 2022-04-20 NOTE — Progress Notes (Signed)
Pt d/c home via w/c no distress noted paperwork given and DME Layaan Mott and [facility] all belongings taken home

## 2022-04-21 ENCOUNTER — Telehealth: Payer: Self-pay

## 2022-04-21 MED ORDER — KETOROLAC TROMETHAMINE 10 MG PO TABS: 10.0000 mg | ORAL_TABLET | Freq: Two times a day (BID) | ORAL | 0 refills | Status: DC | PRN

## 2022-04-21 MED ORDER — HYDROMORPHONE HCL 2 MG PO TABS: 2.0000 mg | ORAL_TABLET | ORAL | 0 refills | Status: DC | PRN

## 2022-04-21 NOTE — Telephone Encounter (Signed)
Email from [facility] [place].Marland Kitchen "Regarding patient Randy Park, s/p THA on 1/15, Dr Erlinda Hong. our therapist saw the patient today and he is reporting that his pain is severe and the pain meds are not relieving his pain. Could you please make Dr Erlinda Hong aware of this ? We told the patient we would inform you guys.."  Thanks and please advise.

## 2022-04-21 NOTE — Telephone Encounter (Signed)
I sent toradol and small supply of dilaudid

## 2022-04-22 ENCOUNTER — Telehealth: Payer: Self-pay | Admitting: Orthopaedic Surgery

## 2022-04-22 NOTE — Telephone Encounter (Signed)
Called and relayed information to patient.

## 2022-04-22 NOTE — Telephone Encounter (Signed)
Patient would like presecretion for Hydromorphone  to be sent to [facility] [address]. Best contact number for patient [phone].

## 2022-04-22 NOTE — Telephone Encounter (Signed)
Spoke with patient. He is going to try Magnesium Citrate. He said that he has been taking the stool softeners prescribed and has not had a BM in 4 days. In regards to the pain medicine he will call his pharmacy and see if there is another pharmacy close by with pain medicine in stock. He will call us back if so.

## 2022-04-22 NOTE — Telephone Encounter (Signed)
Patient called advised he has not had a bowel movement in 4 days. Patient also said the pharmacy advised him they do not know when the Rx for Hydromorphone

## 2022-04-23 ENCOUNTER — Other Ambulatory Visit: Payer: Self-pay | Admitting: Physician Assistant

## 2022-04-23 MED ORDER — HYDROMORPHONE HCL 2 MG PO TABS: 2.0000 mg | ORAL_TABLET | Freq: Four times a day (QID) | ORAL | 0 refills | Status: DC | PRN

## 2022-04-23 NOTE — Telephone Encounter (Signed)
Sent, but needs to wean down.  Make sure he is aware that frequency has changed

## 2022-04-23 NOTE — Telephone Encounter (Signed)
He was not requesting a refill, he was requesting the initial script be sent to a different pharmacy because it was on backorder. He now has the original script that he picked up from [place] today.

## 2022-04-23 NOTE — Telephone Encounter (Signed)
Randy Park.  thanks

## 2022-04-24 ENCOUNTER — Encounter (HOSPITAL_COMMUNITY): Payer: Self-pay

## 2022-04-24 ENCOUNTER — Ambulatory Visit (HOSPITAL_COMMUNITY)
Admission: RE | Admit: 2022-04-24 | Discharge: 2022-04-24 | Disposition: A | Discharge status: Home / Self Care | Payer: 59 | Source: Ambulatory Visit | Attending: Physician Assistant | Admitting: Physician Assistant

## 2022-04-24 VITALS — BP 146/85 | HR 71 | Temp 98.1°F | Resp 16 | Ht 71.0 in | Wt 181.0 lb

## 2022-04-24 DIAGNOSIS — L249 Irritant contact dermatitis, unspecified cause: Secondary | ICD-10-CM

## 2022-04-24 MED ORDER — TRIAMCINOLONE ACETONIDE 0.1 % EX CREA: 1.0000 | TOPICAL_CREAM | Freq: Two times a day (BID) | CUTANEOUS | 0 refills | Status: DC
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
## 2022-04-24 NOTE — ED Triage Notes (Signed)
Chief Complaint: Patient started medication yesterday and now having some sort of reaction. Rash around the surgical site of the left hip. Patient had recent hip replacement.   Onset: this morning   Prescriptions or OTC medications tried: Yes- aspirin     with no relief  New foods, medications, or products: Yes- hydromorphone; oxycodone

## 2022-04-24 NOTE — Discharge Instructions (Addendum)
Advised to resume taking medications as the localized irritant reaction is not due to medication at this point.  Advised to apply the triamcinolone cream to the area 3 times a day to help relieve the itching and the rash on the left leg.  Advised follow-up PCP or return to urgent care if symptoms fail to improve.

## 2022-04-30 ENCOUNTER — Telehealth: Payer: Self-pay | Admitting: Physician Assistant

## 2022-04-30 ENCOUNTER — Telehealth: Payer: Self-pay | Admitting: Orthopaedic Surgery

## 2022-04-30 ENCOUNTER — Other Ambulatory Visit: Payer: Self-pay | Admitting: Physician Assistant

## 2022-04-30 MED ORDER — HYDROMORPHONE HCL 2 MG PO TABS: 2.0000 mg | ORAL_TABLET | Freq: Four times a day (QID) | ORAL | 0 refills | Status: DC | PRN

## 2022-04-30 MED ORDER — OXYCODONE-ACETAMINOPHEN 5-325 MG PO TABS: 1.0000 | ORAL_TABLET | Freq: Four times a day (QID) | ORAL | 0 refills | Status: DC | PRN

## 2022-04-30 NOTE — Telephone Encounter (Signed)
Assuming he meant hydromorphone?  Need to wean to oxycodone and I sent in

## 2022-04-30 NOTE — Telephone Encounter (Signed)
Pt called requesting PA Venida Jarvis or Dr Erlinda Hong to send hydromorphone in for him. He said the oxycodone dont help his pain at all but the hydromorphone does. Pt states he did not pick up the oxycodone. Please send to pharmacy on file as soon as you can. Please call pt when correct meds sent in. Pt phone number is [phone]

## 2022-04-30 NOTE — Telephone Encounter (Signed)
Notified patient.

## 2022-04-30 NOTE — Telephone Encounter (Signed)
Patient requesting a refill on his hydrocodone. Please advise

## 2022-04-30 NOTE — Telephone Encounter (Signed)
I sent in one last rx for this, but please let him know that this is the last one and we need to wean down.  Please call pharmacy and have them cancel percocet

## 2022-05-03 ENCOUNTER — Telehealth: Payer: Self-pay | Admitting: Orthopaedic Surgery

## 2022-05-03 MED ORDER — METHOCARBAMOL 750 MG PO TABS: 750.0000 mg | ORAL_TABLET | Freq: Two times a day (BID) | ORAL | 2 refills | Status: DC | PRN

## 2022-05-03 NOTE — Telephone Encounter (Signed)
Pt called requesting a refill of methocarbamol. Please send to pharmacy on file. Pt phone number is [phone].

## 2022-05-03 NOTE — Telephone Encounter (Signed)
done

## 2022-05-04 ENCOUNTER — Encounter: Payer: Self-pay | Admitting: Orthopaedic Surgery

## 2022-05-04 ENCOUNTER — Ambulatory Visit (INDEPENDENT_AMBULATORY_CARE_PROVIDER_SITE_OTHER): Payer: 59 | Admitting: Orthopaedic Surgery

## 2022-05-04 DIAGNOSIS — Z96642 Presence of left artificial hip joint: Secondary | ICD-10-CM
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
## 2022-05-06 ENCOUNTER — Telehealth: Payer: Self-pay | Admitting: Orthopaedic Surgery

## 2022-05-06 NOTE — Telephone Encounter (Signed)
Patient wants his pain medication refilled Hydrocodone

## 2022-05-07 ENCOUNTER — Other Ambulatory Visit: Payer: Self-pay | Admitting: Physician Assistant

## 2022-05-07 ENCOUNTER — Telehealth: Payer: Self-pay | Admitting: Physician Assistant

## 2022-05-07 ENCOUNTER — Telehealth: Payer: Self-pay | Admitting: Orthopaedic Surgery

## 2022-05-07 MED ORDER — OXYCODONE-ACETAMINOPHEN 5-325 MG PO TABS: 1.0000 | ORAL_TABLET | Freq: Four times a day (QID) | ORAL | 0 refills | Status: DC | PRN

## 2022-05-07 MED ORDER — HYDROCODONE-ACETAMINOPHEN 5-325 MG PO TABS: 1.0000 | ORAL_TABLET | Freq: Three times a day (TID) | ORAL | 0 refills | Status: DC | PRN

## 2022-05-07 NOTE — Telephone Encounter (Signed)
Hydrocodone has been sent in. Message received at the same time other message was. Notified patient.

## 2022-05-07 NOTE — Telephone Encounter (Signed)
Patient is requesting for Randy Park to stop sending in Oxycodone Percocet being that does not work for him he is asking for hydrocodone not the hydromorphone or robaxin, he states Dr Erlinda Hong was going to prescribe him something different, patient would like Dr Erlinda Hong to call him

## 2022-05-07 NOTE — Telephone Encounter (Signed)
He is likely asking for hydromorphone, not norco.  He was told that the last rx we wrote for this had to be the last refill of that medicine.  I have sent in percocet.  We cannot do dilaudid any longer

## 2022-05-07 NOTE — Telephone Encounter (Signed)
Roderic Palau at [facility] called stating [facility] sent in Rx for hydrocodone and oxycodone for pt. They do not know which one to fill. Also pt just picked up Rx for hydromorphone on 04/30/22. They would like to know if this is going to be stopped and one of the two sent today will replace it?

## 2022-05-07 NOTE — Telephone Encounter (Signed)
sent

## 2022-05-10 ENCOUNTER — Telehealth: Payer: Self-pay | Admitting: Physician Assistant

## 2022-05-10 ENCOUNTER — Other Ambulatory Visit: Payer: Self-pay | Admitting: Physician Assistant

## 2022-05-10 NOTE — Telephone Encounter (Signed)
The norco is in place of the oxycodone.  Should have run out  by hydromorphone by now.  Can we call pharmacy and make sure he did not pick up oxy?

## 2022-05-10 NOTE — Telephone Encounter (Signed)
Called and spoke with pharmacy. Cancelled the hydromorphone. He can get the hydrocodone.

## 2022-05-10 NOTE — Telephone Encounter (Signed)
We talked about this last week.  He did not want oxycodone, but hydrocodone and so that is what was sent in.

## 2022-05-10 NOTE — Telephone Encounter (Signed)
I spoke to patient.  Instructed him to take up to two pills every 8 hours prn.  Also told him that he could increase the frequency of robaxin to tid.

## 2022-05-10 NOTE — Telephone Encounter (Signed)
Patient states his Hydrocodone 5/325 patient states he needs something stronger and want someone to call him.

## 2022-05-11 ENCOUNTER — Telehealth: Payer: Self-pay | Admitting: Orthopaedic Surgery

## 2022-05-11 ENCOUNTER — Other Ambulatory Visit: Payer: Self-pay | Admitting: Physician Assistant

## 2022-05-11 MED ORDER — METHOCARBAMOL 750 MG PO TABS: 750.0000 mg | ORAL_TABLET | Freq: Two times a day (BID) | ORAL | 2 refills | Status: DC | PRN

## 2022-05-11 NOTE — Telephone Encounter (Signed)
Patient would like methocarbamol refill, patient made appt to talk with Erlinda Hong about medication

## 2022-05-11 NOTE — Telephone Encounter (Signed)
refilled 

## 2022-05-18 ENCOUNTER — Ambulatory Visit (INDEPENDENT_AMBULATORY_CARE_PROVIDER_SITE_OTHER): Payer: 59 | Admitting: Orthopaedic Surgery

## 2022-05-18 DIAGNOSIS — Z96642 Presence of left artificial hip joint: Secondary | ICD-10-CM

## 2022-05-18 MED ORDER — IBUPROFEN 800 MG PO TABS: 800.0000 mg | ORAL_TABLET | Freq: Three times a day (TID) | ORAL | 2 refills | Status: DC | PRN
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
## 2022-05-24 ENCOUNTER — Other Ambulatory Visit: Payer: Self-pay | Admitting: Physician Assistant

## 2022-05-24 ENCOUNTER — Telehealth: Payer: Self-pay | Admitting: Orthopaedic Surgery

## 2022-05-24 MED ORDER — METHOCARBAMOL 750 MG PO TABS: 750.0000 mg | ORAL_TABLET | Freq: Two times a day (BID) | ORAL | 2 refills | Status: DC | PRN

## 2022-05-24 NOTE — Telephone Encounter (Signed)
Notified patient.

## 2022-05-24 NOTE — Telephone Encounter (Signed)
sent

## 2022-05-24 NOTE — Telephone Encounter (Signed)
Pt called requesting a refill of methocarbamol. Please send to pharmacy on file. Pt phone number is [phone].

## 2022-06-01 ENCOUNTER — Encounter: Payer: 59 | Admitting: Orthopaedic Surgery

## 2022-06-03 ENCOUNTER — Telehealth: Payer: Self-pay | Admitting: Family Medicine

## 2022-06-03 ENCOUNTER — Telehealth: Payer: Self-pay | Admitting: Orthopaedic Surgery

## 2022-06-03 ENCOUNTER — Other Ambulatory Visit: Payer: Self-pay | Admitting: Physician Assistant

## 2022-06-03 MED ORDER — TAMSULOSIN HCL 0.4 MG PO CAPS: 0.4000 mg | ORAL_CAPSULE | Freq: Every day | ORAL | 0 refills | Status: DC

## 2022-06-03 MED ORDER — METHOCARBAMOL 750 MG PO TABS: 750.0000 mg | ORAL_TABLET | Freq: Two times a day (BID) | ORAL | 2 refills | Status: DC | PRN

## 2022-06-03 NOTE — Telephone Encounter (Signed)
Patient advising he needs refill on his muscle relaxer's and his ASA states he is not sure he need that ASA. Please advise

## 2022-06-03 NOTE — Telephone Encounter (Signed)
Prescription Request  06/03/2022   LOV: 03/01/2022  What is the name of the medication or equipment? tamsulosin   Have you contacted your pharmacy to request a refill? No   Which pharmacy would you like this sent to?  [id number] - [address], [address] [place] [address] Phone: [phone] Fax: [phone]     Patient notified that their request is being sent to the clinical staff for review and that they should receive a response within 2 business days.   Please advise at Mobile [phone] (mobile)

## 2022-06-03 NOTE — Telephone Encounter (Signed)
Sent in Geiger.  Asa that we sent in only needed for 6 weeks

## 2022-06-22 ENCOUNTER — Ambulatory Visit (INDEPENDENT_AMBULATORY_CARE_PROVIDER_SITE_OTHER): Payer: 59 | Admitting: Orthopaedic Surgery

## 2022-06-22 ENCOUNTER — Other Ambulatory Visit (INDEPENDENT_AMBULATORY_CARE_PROVIDER_SITE_OTHER): Payer: 59

## 2022-06-22 DIAGNOSIS — Z96642 Presence of left artificial hip joint: Secondary | ICD-10-CM
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
## 2022-08-09 ENCOUNTER — Encounter: Payer: 59 | Admitting: Family Medicine

## 2022-08-09 ENCOUNTER — Telehealth: Payer: Self-pay | Admitting: Family Medicine

## 2022-08-09 NOTE — Telephone Encounter (Signed)
Pt was a no show for a cpe with Dr Doreene Burke for 08/09/22, I sent a letter.

## 2022-08-27 NOTE — Telephone Encounter (Signed)
1st no show, fee waived, letter sent 

## 2022-08-31 ENCOUNTER — Telehealth: Payer: Self-pay | Admitting: Orthopaedic Surgery

## 2022-08-31 NOTE — Telephone Encounter (Signed)
Patient would like a note releasing him back to work full duty

## 2022-08-31 NOTE — Telephone Encounter (Signed)
Yes

## 2022-08-31 NOTE — Telephone Encounter (Signed)
Spoke with patient. Left note up front for pick up. °

## 2022-09-24 ENCOUNTER — Ambulatory Visit: Payer: 59 | Admitting: Orthopaedic Surgery

## 2022-09-28 ENCOUNTER — Ambulatory Visit: Payer: 59 | Admitting: Orthopaedic Surgery

## 2022-11-02 ENCOUNTER — Ambulatory Visit: Payer: 59 | Admitting: Orthopaedic Surgery

## 2022-12-03 ENCOUNTER — Other Ambulatory Visit (INDEPENDENT_AMBULATORY_CARE_PROVIDER_SITE_OTHER): Payer: 59

## 2022-12-03 ENCOUNTER — Ambulatory Visit: Payer: 59 | Admitting: Orthopaedic Surgery

## 2022-12-03 DIAGNOSIS — Z96642 Presence of left artificial hip joint: Secondary | ICD-10-CM | POA: Diagnosis not present

## 2022-12-03 DIAGNOSIS — M1611 Unilateral primary osteoarthritis, right hip: Secondary | ICD-10-CM
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
## 2022-12-17 ENCOUNTER — Other Ambulatory Visit: Payer: Self-pay

## 2023-01-31 ENCOUNTER — Other Ambulatory Visit: Payer: Self-pay | Admitting: Physician Assistant

## 2023-01-31 MED ORDER — OXYCODONE-ACETAMINOPHEN 5-325 MG PO TABS
1.0000 | ORAL_TABLET | Freq: Four times a day (QID) | ORAL | 0 refills | Status: DC | PRN
Start: 2023-01-31 — End: 2023-02-09

## 2023-01-31 MED ORDER — ONDANSETRON HCL 4 MG PO TABS: 4.0000 mg | ORAL_TABLET | Freq: Three times a day (TID) | ORAL | 0 refills | Status: DC | PRN

## 2023-01-31 MED ORDER — DOCUSATE SODIUM 100 MG PO CAPS: 100.0000 mg | ORAL_CAPSULE | Freq: Every day | ORAL | 2 refills | Status: DC | PRN

## 2023-01-31 MED ORDER — ASPIRIN 81 MG PO TBEC: 81.0000 mg | DELAYED_RELEASE_TABLET | Freq: Two times a day (BID) | ORAL | 0 refills | Status: DC

## 2023-01-31 MED ORDER — METHOCARBAMOL 750 MG PO TABS: 750.0000 mg | ORAL_TABLET | Freq: Two times a day (BID) | ORAL | 2 refills | Status: DC | PRN

## 2023-01-31 NOTE — Pre-Procedure Instructions (Signed)
Surgical Instructions   Your procedure is scheduled on Monday, November 4th. Report to [facility] Main Entrance "A" at 05:30 A.M., then check in with the Admitting office. Any questions or running late day of surgery: call [phone]  Questions prior to your surgery date: call [phone], Monday-Friday, 8am-4pm. If you experience any cold or flu symptoms such as cough, fever, chills, shortness of breath, etc. between now and your scheduled surgery, please notify us at the above number.     Remember:  Do not eat after midnight the night before your surgery  You may drink clear liquids until 04:30 AM the morning of your surgery.   Clear liquids allowed are: Water, Non-Citrus Juices (without pulp), Carbonated Beverages, Clear Tea, Black Coffee Only (NO MILK, CREAM OR POWDERED CREAMER of any kind), and Gatorade.  Patient Instructions  The night before surgery:  No food after midnight. ONLY clear liquids after midnight  The day of surgery (if you do NOT have diabetes):  Drink ONE (1) Pre-Surgery Clear Ensure by 04:30 AM the morning of surgery. Drink in one sitting. Do not sip.  This drink was given to you during your hospital  pre-op appointment visit.  Nothing else to drink after completing the  Pre-Surgery Clear Ensure.         If you have questions, please contact your surgeon's office.    Take these medicines the morning of surgery with A SIP OF WATER  atorvastatin (LIPITOR)  tamsulosin [facility])   May take these medicines IF NEEDED: acetaminophen (TYLENOL)  methocarbamol (ROBAXIN-750)  ondansetron (ZOFRAN)    One week prior to surgery, STOP taking any Aspirin (unless otherwise instructed by your surgeon) Aleve, Naproxen, Ibuprofen, Motrin, Advil, Goody's, BC's, all herbal medications, fish oil, and non-prescription vitamins.                     Do NOT Smoke (Tobacco/Vaping) for 24 hours prior to your procedure.  If you use a CPAP at night, you may bring your  mask/headgear for your overnight stay.   You will be asked to remove any contacts, glasses, piercing's, hearing aid's, dentures/partials prior to surgery. Please bring cases for these items if needed.    Patients discharged the day of surgery will not be allowed to drive home, and someone needs to stay with them for 24 hours.  SURGICAL WAITING ROOM VISITATION Patients may have no more than 2 support people in the waiting area - these visitors may rotate.   Pre-op nurse will coordinate an appropriate time for 1 ADULT support person, who may not rotate, to accompany patient in pre-op.  Children under the age of 48 must have an adult with them who is not the patient and must remain in the main waiting area with an adult.  If the patient needs to stay at the hospital during part of their recovery, the visitor guidelines for inpatient rooms apply.  Please refer to the [facility] website for the visitor guidelines for any additional information.   If you received a COVID test during your pre-op visit  it is requested that you wear a mask when out in public, stay away from anyone that may not be feeling well and notify your surgeon if you develop symptoms. If you have been in contact with anyone that has tested positive in the last 10 days please notify you surgeon.      Pre-operative 5 CHG Bathing Instructions   You can play a key role in reducing  the risk of infection after surgery. Your skin needs to be as free of germs as possible. You can reduce the number of germs on your skin by washing with CHG (chlorhexidine gluconate) soap before surgery. CHG is an antiseptic soap that kills germs and continues to kill germs even after washing.   DO NOT use if you have an allergy to chlorhexidine/CHG or antibacterial soaps. If your skin becomes reddened or irritated, stop using the CHG and notify one of our RNs at [phone].   Please shower with the CHG soap starting 4 days before surgery using the  following schedule:     Please keep in mind the following:  DO NOT shave, including legs and underarms, starting the day of your first shower.   You may shave your face at any point before/day of surgery.  Place clean sheets on your bed the day you start using CHG soap. Use a clean washcloth (not used since being washed) for each shower. DO NOT sleep with pets once you start using the CHG.   CHG Shower Instructions:  Wash your face and private area with normal soap. If you choose to wash your hair, wash first with your normal shampoo.  After you use shampoo/soap, rinse your hair and body thoroughly to remove shampoo/soap residue.  Turn the water OFF and apply about 3 tablespoons (45 ml) of CHG soap to a CLEAN washcloth.  Apply CHG soap ONLY FROM YOUR NECK DOWN TO YOUR TOES (washing for 3-5 minutes)  DO NOT use CHG soap on face, private areas, open wounds, or sores.  Pay special attention to the area where your surgery is being performed.  If you are having back surgery, having someone wash your back for you may be helpful. Wait 2 minutes after CHG soap is applied, then you may rinse off the CHG soap.  Pat dry with a clean towel  Put on clean clothes/pajamas   If you choose to wear lotion, please use ONLY the CHG-compatible lotions on the back of this paper.   Additional instructions for the day of surgery: DO NOT APPLY any lotions, deodorants, cologne, or perfumes.   Do not bring valuables to the hospital. [facility] is not responsible for any belongings/valuables. Do not wear nail polish, gel polish, artificial nails, or any other type of covering on natural nails (fingers and toes) Do not wear jewelry or makeup Put on clean/comfortable clothes.  Please brush your teeth.  Ask your nurse before applying any prescription medications to the skin.     CHG Compatible Lotions   Aveeno Moisturizing lotion  Cetaphil Moisturizing Cream  Cetaphil Moisturizing Lotion  Clairol Herbal  Essence Moisturizing Lotion, Dry Skin  Clairol Herbal Essence Moisturizing Lotion, Extra Dry Skin  Clairol Herbal Essence Moisturizing Lotion, Normal Skin  Curel Age Defying Therapeutic Moisturizing Lotion with Alpha Hydroxy  Curel Extreme Care Body Lotion  Curel Soothing Hands Moisturizing Hand Lotion  Curel Therapeutic Moisturizing Cream, Fragrance-Free  Curel Therapeutic Moisturizing Lotion, Fragrance-Free  Curel Therapeutic Moisturizing Lotion, Original Formula  Eucerin Daily Replenishing Lotion  Eucerin Dry Skin Therapy Plus Alpha Hydroxy Crme  Eucerin Dry Skin Therapy Plus Alpha Hydroxy Lotion  Eucerin Original Crme  Eucerin Original Lotion  Eucerin Plus Crme Eucerin Plus Lotion  Eucerin TriLipid Replenishing Lotion  Keri Anti-Bacterial Hand Lotion  Keri Deep Conditioning Original Lotion Dry Skin Formula Softly Scented  Keri Deep Conditioning Original Lotion, Fragrance Free Sensitive Skin Formula  Keri Lotion Fast Absorbing Fragrance Free Sensitive Skin Formula  Keri Lotion Fast Absorbing Softly Scented Dry Skin Formula  Keri Original Lotion  Keri Skin Renewal Lotion Keri Silky Smooth Lotion  Keri Silky Smooth Sensitive Skin Lotion  Nivea Body Creamy Conditioning Patent examiner Moisturizing Lotion Nivea Crme  Nivea Skin Firming Lotion  NutraDerm 30 Skin Lotion  NutraDerm Skin Lotion  NutraDerm Therapeutic Skin Cream  NutraDerm Therapeutic Skin Lotion  ProShield Protective Hand Cream  Provon moisturizing lotion  Please read over the following fact sheets that you were given.

## 2023-02-01 ENCOUNTER — Encounter (HOSPITAL_COMMUNITY): Payer: Self-pay

## 2023-02-01 ENCOUNTER — Inpatient Hospital Stay (HOSPITAL_COMMUNITY)
Admission: RE | Admit: 2023-02-01 | Discharge: 2023-02-01 | Disposition: A | Discharge status: Home / Self Care | Payer: 59 | Source: Ambulatory Visit | Attending: Orthopaedic Surgery | Admitting: Orthopaedic Surgery

## 2023-02-01 ENCOUNTER — Other Ambulatory Visit: Payer: Self-pay

## 2023-02-01 VITALS — BP 126/71 | HR 57 | Temp 97.8°F | Resp 18 | Ht 71.0 in | Wt 182.1 lb

## 2023-02-01 DIAGNOSIS — Z01812 Encounter for preprocedural laboratory examination: Secondary | ICD-10-CM | POA: Insufficient documentation

## 2023-02-01 DIAGNOSIS — Z01818 Encounter for other preprocedural examination: Secondary | ICD-10-CM

## 2023-02-01 DIAGNOSIS — M1611 Unilateral primary osteoarthritis, right hip: Secondary | ICD-10-CM | POA: Diagnosis not present

## 2023-02-01 LAB — CBC
HCT: 40.7 % (ref 39.0–52.0)
Hemoglobin: 13.4 g/dL (ref 13.0–17.0)
MCH: 31.8 pg (ref 26.0–34.0)
MCHC: 32.9 g/dL (ref 30.0–36.0)
MCV: 96.7 fL (ref 80.0–100.0)
Platelets: 252 10*3/uL (ref 150–400)
RBC: 4.21 MIL/uL — ABNORMAL LOW (ref 4.22–5.81)
RDW: 13.2 % (ref 11.5–15.5)
WBC: 5.5 10*3/uL (ref 4.0–10.5)
nRBC: 0 % (ref 0.0–0.2)

## 2023-02-01 LAB — BASIC METABOLIC PANEL
Anion gap: 7 (ref 5–15)
BUN: 15 mg/dL (ref 6–20)
CO2: 24 mmol/L (ref 22–32)
Calcium: 8.6 mg/dL — ABNORMAL LOW (ref 8.9–10.3)
Chloride: 109 mmol/L (ref 98–111)
Creatinine, Ser: 1.27 mg/dL — ABNORMAL HIGH (ref 0.61–1.24)
GFR, Estimated: >60 mL/min (ref >60)
Glucose, Bld: 83 mg/dL (ref 70–99)
Potassium: 4.1 mmol/L (ref 3.5–5.1)
Sodium: 140 mmol/L (ref 135–145)

## 2023-02-01 LAB — TYPE AND SCREEN
ABO/RH(D): O POS
Antibody Screen: NEGATIVE

## 2023-02-01 LAB — SURGICAL PCR SCREEN
MRSA, PCR: NEGATIVE
Staphylococcus aureus: NEGATIVE

## 2023-02-01 NOTE — Progress Notes (Signed)
PCP - Dr. Nadene Rubins Cardiologist - denies  PPM/ICD - denies   Chest x-ray - 06/03/17 EKG - 06/03/17 (n/a) Stress Test - denies ECHO - denies Cardiac Cath - denies  Sleep Study - denies   DM- denies  ASA/Blood Thinner Instructions: n/a   ERAS Protcol - yes PRE-SURGERY Ensure given at PAT  COVID TEST- n/a   Anesthesia review: no  Patient denies shortness of breath, fever, cough and chest pain at PAT appointment   All instructions explained to the patient, with a verbal understanding of the material. Patient agrees to go over the instructions while at home for a better understanding.  The opportunity to ask questions was provided.

## 2023-02-04 MED ORDER — TRANEXAMIC ACID 1000 MG/10ML IV SOLN
2000.0000 mg | INTRAVENOUS | Status: DC
  Filled 2023-02-04: qty 20

## 2023-02-07 ENCOUNTER — Observation Stay (HOSPITAL_COMMUNITY): Payer: 59

## 2023-02-07 ENCOUNTER — Other Ambulatory Visit: Payer: Self-pay

## 2023-02-07 ENCOUNTER — Encounter (HOSPITAL_COMMUNITY): Payer: Self-pay | Admitting: Orthopaedic Surgery

## 2023-02-07 ENCOUNTER — Observation Stay (HOSPITAL_COMMUNITY): Payer: 59 | Admitting: Anesthesiology

## 2023-02-07 ENCOUNTER — Observation Stay (HOSPITAL_BASED_OUTPATIENT_CLINIC_OR_DEPARTMENT_OTHER): Payer: 59 | Admitting: Anesthesiology

## 2023-02-07 ENCOUNTER — Observation Stay (HOSPITAL_COMMUNITY)
Admission: RE | Admit: 2023-02-07 | Discharge: 2023-02-08 | Disposition: A | Discharge status: Home / Self Care | Payer: 59 | Attending: Orthopaedic Surgery | Admitting: Orthopaedic Surgery

## 2023-02-07 ENCOUNTER — Other Ambulatory Visit: Payer: Self-pay | Admitting: Physician Assistant

## 2023-02-07 ENCOUNTER — Encounter (HOSPITAL_COMMUNITY)
Admission: RE | Disposition: A | Discharge status: Home / Self Care | Payer: Self-pay | Source: Home / Self Care | Attending: Orthopaedic Surgery

## 2023-02-07 DIAGNOSIS — Z79899 Other long term (current) drug therapy: Secondary | ICD-10-CM | POA: Insufficient documentation

## 2023-02-07 DIAGNOSIS — Z7982 Long term (current) use of aspirin: Secondary | ICD-10-CM | POA: Insufficient documentation

## 2023-02-07 DIAGNOSIS — Z87891 Personal history of nicotine dependence: Secondary | ICD-10-CM | POA: Insufficient documentation

## 2023-02-07 DIAGNOSIS — M1611 Unilateral primary osteoarthritis, right hip: Principal | ICD-10-CM

## 2023-02-07 DIAGNOSIS — Z96641 Presence of right artificial hip joint: Secondary | ICD-10-CM

## 2023-02-07 DIAGNOSIS — Z96642 Presence of left artificial hip joint: Secondary | ICD-10-CM | POA: Insufficient documentation

## 2023-02-07 HISTORY — PX: TOTAL HIP ARTHROPLASTY: SHX124

## 2023-02-07 SURGERY — ARTHROPLASTY, HIP, TOTAL, ANTERIOR APPROACH
Anesthesia: General | Site: Hip | Laterality: Right

## 2023-02-07 MED ORDER — BUPIVACAINE-EPINEPHRINE (PF) 0.25% -1:200000 IJ SOLN
INTRAMUSCULAR | Status: AC
  Filled 2023-02-07: qty 30

## 2023-02-07 MED ORDER — PRONTOSAN WOUND IRRIGATION OPTIME
TOPICAL | Status: DC | PRN
  Administered 2023-02-07: 1

## 2023-02-07 MED ORDER — METOCLOPRAMIDE HCL 5 MG PO TABS: 5.0000 mg | ORAL_TABLET | Freq: Three times a day (TID) | ORAL | Status: DC | PRN

## 2023-02-07 MED ORDER — PHENYLEPHRINE HCL-NACL 20-0.9 MG/250ML-% IV SOLN
INTRAVENOUS | Status: DC | PRN
  Administered 2023-02-07: 20 ug/min via INTRAVENOUS

## 2023-02-07 MED ORDER — VANCOMYCIN HCL 1 G IV SOLR
INTRAVENOUS | Status: DC | PRN
  Administered 2023-02-07: 1000 mg via TOPICAL

## 2023-02-07 MED ORDER — DEXAMETHASONE SODIUM PHOSPHATE 10 MG/ML IJ SOLN
INTRAMUSCULAR | Status: AC
  Filled 2023-02-07: qty 1

## 2023-02-07 MED ORDER — ONDANSETRON HCL 4 MG/2ML IJ SOLN: 4.0000 mg | Freq: Four times a day (QID) | INTRAMUSCULAR | Status: DC | PRN

## 2023-02-07 MED ORDER — FENTANYL CITRATE (PF) 100 MCG/2ML IJ SOLN
INTRAMUSCULAR | Status: AC
  Filled 2023-02-07: qty 2

## 2023-02-07 MED ORDER — CEFAZOLIN SODIUM-DEXTROSE 2-4 GM/100ML-% IV SOLN
2.0000 g | INTRAVENOUS | Status: AC
  Administered 2023-02-07: 2 g via INTRAVENOUS
  Filled 2023-02-07: qty 100

## 2023-02-07 MED ORDER — MENTHOL 3 MG MT LOZG: 1.0000 | LOZENGE | OROMUCOSAL | Status: DC | PRN

## 2023-02-07 MED ORDER — TAMSULOSIN HCL 0.4 MG PO CAPS
0.4000 mg | ORAL_CAPSULE | Freq: Every day | ORAL | Status: DC
  Administered 2023-02-07 – 2023-02-08 (×2): 0.4 mg via ORAL
  Filled 2023-02-07 (×2): qty 1

## 2023-02-07 MED ORDER — AMISULPRIDE (ANTIEMETIC) 5 MG/2ML IV SOLN: 10.0000 mg | Freq: Once | INTRAVENOUS | Status: DC | PRN

## 2023-02-07 MED ORDER — ORAL CARE MOUTH RINSE: 15.0000 mL | Freq: Once | OROMUCOSAL | Status: AC

## 2023-02-07 MED ORDER — EPHEDRINE 5 MG/ML INJ
INTRAVENOUS | Status: AC
  Filled 2023-02-07: qty 5

## 2023-02-07 MED ORDER — LACTATED RINGERS IV SOLN: INTRAVENOUS | Status: DC

## 2023-02-07 MED ORDER — DEXAMETHASONE SODIUM PHOSPHATE 10 MG/ML IJ SOLN: 10.0000 mg | Freq: Once | INTRAMUSCULAR | Status: DC

## 2023-02-07 MED ORDER — ONDANSETRON HCL 4 MG/2ML IJ SOLN
INTRAMUSCULAR | Status: DC | PRN
  Administered 2023-02-07: 4 mg via INTRAVENOUS

## 2023-02-07 MED ORDER — HYDROMORPHONE HCL 2 MG PO TABS: 2.0000 mg | ORAL_TABLET | Freq: Two times a day (BID) | ORAL | 0 refills | Status: DC | PRN

## 2023-02-07 MED ORDER — BUPIVACAINE LIPOSOME 1.3 % IJ SUSP: INTRAMUSCULAR | Status: DC | PRN

## 2023-02-07 MED ORDER — LIDOCAINE 2% (20 MG/ML) 5 ML SYRINGE
INTRAMUSCULAR | Status: AC
  Filled 2023-02-07: qty 5

## 2023-02-07 MED ORDER — PROPOFOL 10 MG/ML IV BOLUS
INTRAVENOUS | Status: DC | PRN
  Administered 2023-02-07: 200 mg via INTRAVENOUS

## 2023-02-07 MED ORDER — ONDANSETRON HCL 4 MG/2ML IJ SOLN
INTRAMUSCULAR | Status: AC
  Filled 2023-02-07: qty 2

## 2023-02-07 MED ORDER — METOCLOPRAMIDE HCL 5 MG/ML IJ SOLN: 5.0000 mg | Freq: Three times a day (TID) | INTRAMUSCULAR | Status: DC | PRN

## 2023-02-07 MED ORDER — PANTOPRAZOLE SODIUM 40 MG PO TBEC
40.0000 mg | DELAYED_RELEASE_TABLET | Freq: Every day | ORAL | Status: DC
Start: 2023-02-07 — End: 2023-02-08
  Administered 2023-02-07 – 2023-02-08 (×2): 40 mg via ORAL
  Filled 2023-02-07 (×2): qty 1

## 2023-02-07 MED ORDER — ALUM & MAG HYDROXIDE-SIMETH 200-200-20 MG/5ML PO SUSP: 30.0000 mL | ORAL | Status: DC | PRN

## 2023-02-07 MED ORDER — TRANEXAMIC ACID-NACL 1000-0.7 MG/100ML-% IV SOLN
1000.0000 mg | INTRAVENOUS | Status: AC
  Administered 2023-02-07: 1000 mg via INTRAVENOUS
  Filled 2023-02-07: qty 100

## 2023-02-07 MED ORDER — DOCUSATE SODIUM 100 MG PO CAPS
100.0000 mg | ORAL_CAPSULE | Freq: Two times a day (BID) | ORAL | Status: DC
  Administered 2023-02-07 – 2023-02-08 (×2): 100 mg via ORAL
  Filled 2023-02-07 (×2): qty 1

## 2023-02-07 MED ORDER — PHENOL 1.4 % MT LIQD: 1.0000 | OROMUCOSAL | Status: DC | PRN

## 2023-02-07 MED ORDER — SENNA 8.6 MG PO TABS
1.0000 | ORAL_TABLET | Freq: Two times a day (BID) | ORAL | Status: DC | PRN
  Administered 2023-02-08: 17.2 mg via ORAL
  Filled 2023-02-07: qty 2

## 2023-02-07 MED ORDER — 0.9 % SODIUM CHLORIDE (POUR BTL) OPTIME
TOPICAL | Status: DC | PRN
  Administered 2023-02-07: 1000 mL

## 2023-02-07 MED ORDER — EPHEDRINE SULFATE-NACL 50-0.9 MG/10ML-% IV SOSY
PREFILLED_SYRINGE | INTRAVENOUS | Status: DC | PRN
  Administered 2023-02-07: 10 mg via INTRAVENOUS

## 2023-02-07 MED ORDER — BUPIVACAINE-MELOXICAM ER 400-12 MG/14ML IJ SOLN
INTRAMUSCULAR | Status: DC | PRN
  Administered 2023-02-07: 400 mg

## 2023-02-07 MED ORDER — TRANEXAMIC ACID 1000 MG/10ML IV SOLN
INTRAVENOUS | Status: DC | PRN
  Administered 2023-02-07: 2000 mg via TOPICAL

## 2023-02-07 MED ORDER — HYDROMORPHONE HCL 1 MG/ML IJ SOLN
0.5000 mg | INTRAMUSCULAR | Status: DC | PRN
  Administered 2023-02-07 – 2023-02-08 (×3): 1 mg via INTRAVENOUS
  Filled 2023-02-07 (×3): qty 1

## 2023-02-07 MED ORDER — KETAMINE HCL 50 MG/5ML IJ SOSY
PREFILLED_SYRINGE | INTRAMUSCULAR | Status: AC
  Filled 2023-02-07: qty 5

## 2023-02-07 MED ORDER — OXYCODONE HCL 5 MG PO TABS
10.0000 mg | ORAL_TABLET | ORAL | Status: DC | PRN
Start: 2023-02-07 — End: 2023-02-08
  Administered 2023-02-07 – 2023-02-08 (×4): 10 mg via ORAL
  Filled 2023-02-07 (×4): qty 2

## 2023-02-07 MED ORDER — MIDAZOLAM HCL 2 MG/2ML IJ SOLN
INTRAMUSCULAR | Status: AC
  Filled 2023-02-07: qty 2

## 2023-02-07 MED ORDER — OXYCODONE HCL ER 10 MG PO T12A
10.0000 mg | EXTENDED_RELEASE_TABLET | Freq: Two times a day (BID) | ORAL | Status: DC
  Administered 2023-02-07 – 2023-02-08 (×3): 10 mg via ORAL
  Filled 2023-02-07 (×3): qty 1

## 2023-02-07 MED ORDER — METHOCARBAMOL 1000 MG/10ML IJ SOLN: 500.0000 mg | Freq: Four times a day (QID) | INTRAMUSCULAR | Status: DC | PRN

## 2023-02-07 MED ORDER — ACETAMINOPHEN 325 MG PO TABS: 325.0000 mg | ORAL_TABLET | Freq: Four times a day (QID) | ORAL | Status: DC | PRN

## 2023-02-07 MED ORDER — FENTANYL CITRATE (PF) 250 MCG/5ML IJ SOLN
INTRAMUSCULAR | Status: DC | PRN
  Administered 2023-02-07: 100 ug via INTRAVENOUS
  Administered 2023-02-07 (×3): 50 ug via INTRAVENOUS

## 2023-02-07 MED ORDER — PROPOFOL 10 MG/ML IV BOLUS
INTRAVENOUS | Status: AC
  Filled 2023-02-07: qty 20

## 2023-02-07 MED ORDER — MAGNESIUM CITRATE PO SOLN
1.0000 | Freq: Once | ORAL | Status: DC | PRN
Start: 2023-02-07 — End: 2023-02-08

## 2023-02-07 MED ORDER — METHOCARBAMOL 500 MG PO TABS
500.0000 mg | ORAL_TABLET | Freq: Four times a day (QID) | ORAL | Status: DC | PRN
  Administered 2023-02-07 – 2023-02-08 (×3): 500 mg via ORAL
  Filled 2023-02-07 (×3): qty 1

## 2023-02-07 MED ORDER — SORBITOL 70 % SOLN: 30.0000 mL | Freq: Every day | Status: DC | PRN

## 2023-02-07 MED ORDER — POLYETHYLENE GLYCOL 3350 17 G PO PACK
17.0000 g | PACK | Freq: Every day | ORAL | Status: DC
Start: 2023-02-07 — End: 2023-02-08
  Administered 2023-02-07 – 2023-02-08 (×2): 17 g via ORAL
  Filled 2023-02-07 (×2): qty 1

## 2023-02-07 MED ORDER — DIPHENHYDRAMINE HCL 12.5 MG/5ML PO ELIX: 25.0000 mg | ORAL_SOLUTION | ORAL | Status: DC | PRN

## 2023-02-07 MED ORDER — FENTANYL CITRATE (PF) 250 MCG/5ML IJ SOLN
INTRAMUSCULAR | Status: AC
  Filled 2023-02-07: qty 5

## 2023-02-07 MED ORDER — BUPIVACAINE LIPOSOME 1.3 % IJ SUSP
INTRAMUSCULAR | Status: AC
  Filled 2023-02-07: qty 20

## 2023-02-07 MED ORDER — ALBUMIN HUMAN 5 % IV SOLN: INTRAVENOUS | Status: DC | PRN

## 2023-02-07 MED ORDER — CHLORHEXIDINE GLUCONATE 0.12 % MT SOLN
15.0000 mL | Freq: Once | OROMUCOSAL | Status: AC
  Administered 2023-02-07: 15 mL via OROMUCOSAL
  Filled 2023-02-07: qty 15

## 2023-02-07 MED ORDER — MIDAZOLAM HCL 2 MG/2ML IJ SOLN
INTRAMUSCULAR | Status: DC | PRN
  Administered 2023-02-07: 2 mg via INTRAVENOUS

## 2023-02-07 MED ORDER — ONDANSETRON HCL 4 MG PO TABS
4.0000 mg | ORAL_TABLET | Freq: Four times a day (QID) | ORAL | Status: DC | PRN
  Administered 2023-02-08: 4 mg via ORAL
  Filled 2023-02-07: qty 1

## 2023-02-07 MED ORDER — POVIDONE-IODINE 10 % EX SWAB
2.0000 | Freq: Once | CUTANEOUS | Status: AC
  Administered 2023-02-07: 2 via TOPICAL

## 2023-02-07 MED ORDER — SODIUM CHLORIDE 0.9 % IR SOLN
Status: DC | PRN
  Administered 2023-02-07: 1000 mL

## 2023-02-07 MED ORDER — ASPIRIN 81 MG PO CHEW
81.0000 mg | CHEWABLE_TABLET | Freq: Two times a day (BID) | ORAL | Status: DC
Start: 2023-02-07 — End: 2023-02-08
  Administered 2023-02-07 – 2023-02-08 (×2): 81 mg via ORAL
  Filled 2023-02-07 (×2): qty 1

## 2023-02-07 MED ORDER — ACETAMINOPHEN 500 MG PO TABS
1000.0000 mg | ORAL_TABLET | Freq: Four times a day (QID) | ORAL | Status: AC
  Administered 2023-02-07 – 2023-02-08 (×4): 1000 mg via ORAL
  Filled 2023-02-07 (×4): qty 2

## 2023-02-07 MED ORDER — SUGAMMADEX SODIUM 200 MG/2ML IV SOLN
INTRAVENOUS | Status: DC | PRN
  Administered 2023-02-07: 200 mg via INTRAVENOUS

## 2023-02-07 MED ORDER — CEFAZOLIN SODIUM-DEXTROSE 2-4 GM/100ML-% IV SOLN
2.0000 g | Freq: Four times a day (QID) | INTRAVENOUS | Status: AC
  Administered 2023-02-07 (×2): 2 g via INTRAVENOUS
  Filled 2023-02-07 (×2): qty 100

## 2023-02-07 MED ORDER — BUPIVACAINE-MELOXICAM ER 400-12 MG/14ML IJ SOLN
INTRAMUSCULAR | Status: AC
  Filled 2023-02-07: qty 1

## 2023-02-07 MED ORDER — ROCURONIUM BROMIDE 10 MG/ML (PF) SYRINGE
PREFILLED_SYRINGE | INTRAVENOUS | Status: DC | PRN
  Administered 2023-02-07: 50 mg via INTRAVENOUS

## 2023-02-07 MED ORDER — OXYCODONE HCL 5 MG PO TABS
5.0000 mg | ORAL_TABLET | ORAL | Status: DC | PRN
Start: 2023-02-07 — End: 2023-02-08
  Administered 2023-02-08: 10 mg via ORAL
  Filled 2023-02-07: qty 2

## 2023-02-07 MED ORDER — PHENYLEPHRINE 80 MCG/ML (10ML) SYRINGE FOR IV PUSH (FOR BLOOD PRESSURE SUPPORT)
PREFILLED_SYRINGE | INTRAVENOUS | Status: AC
  Filled 2023-02-07: qty 10

## 2023-02-07 MED ORDER — BUPIVACAINE-EPINEPHRINE (PF) 0.25% -1:200000 IJ SOLN: INTRAMUSCULAR | Status: DC | PRN

## 2023-02-07 MED ORDER — LIDOCAINE 2% (20 MG/ML) 5 ML SYRINGE
INTRAMUSCULAR | Status: DC | PRN
  Administered 2023-02-07: 40 mg via INTRAVENOUS

## 2023-02-07 MED ORDER — DEXAMETHASONE SODIUM PHOSPHATE 10 MG/ML IJ SOLN
INTRAMUSCULAR | Status: DC | PRN
  Administered 2023-02-07: 10 mg via INTRAVENOUS

## 2023-02-07 MED ORDER — ACETAMINOPHEN 500 MG PO TABS
1000.0000 mg | ORAL_TABLET | Freq: Once | ORAL | Status: AC
  Administered 2023-02-07: 1000 mg via ORAL
  Filled 2023-02-07: qty 2

## 2023-02-07 MED ORDER — PHENYLEPHRINE 80 MCG/ML (10ML) SYRINGE FOR IV PUSH (FOR BLOOD PRESSURE SUPPORT)
PREFILLED_SYRINGE | INTRAVENOUS | Status: DC | PRN
  Administered 2023-02-07: 80 ug via INTRAVENOUS

## 2023-02-07 MED ORDER — KETAMINE HCL 10 MG/ML IJ SOLN
INTRAMUSCULAR | Status: DC | PRN
  Administered 2023-02-07: 30 mg via INTRAVENOUS

## 2023-02-07 MED ORDER — VANCOMYCIN HCL 1000 MG IV SOLR
INTRAVENOUS | Status: AC
  Filled 2023-02-07: qty 20

## 2023-02-07 MED ORDER — FENTANYL CITRATE (PF) 100 MCG/2ML IJ SOLN
25.0000 ug | INTRAMUSCULAR | Status: DC | PRN
  Administered 2023-02-07 (×2): 50 ug via INTRAVENOUS

## 2023-02-07 MED ORDER — TRANEXAMIC ACID-NACL 1000-0.7 MG/100ML-% IV SOLN
1000.0000 mg | Freq: Once | INTRAVENOUS | Status: AC
  Administered 2023-02-07: 1000 mg via INTRAVENOUS
  Filled 2023-02-07: qty 100

## 2023-02-07 SURGICAL SUPPLY — 61 items
BAG COUNTER SPONGE SURGICOUNT (BAG) ×2 IMPLANT
BAG DECANTER FOR FLEXI CONT (MISCELLANEOUS) ×2 IMPLANT
BAG SPNG CNTER NS LX DISP (BAG) ×1
BLADE SAG 18X100X1.27 (BLADE) ×2 IMPLANT
CLSR STERI-STRIP ANTIMIC 1/2X4 (GAUZE/BANDAGES/DRESSINGS) IMPLANT
COVER PERINEAL POST (MISCELLANEOUS) ×2 IMPLANT
COVER SURGICAL LIGHT HANDLE (MISCELLANEOUS) ×2 IMPLANT
DRAPE C-ARM 42X72 X-RAY (DRAPES) ×2 IMPLANT
DRAPE POUCH INSTRU U-SHP 10X18 (DRAPES) ×2 IMPLANT
DRAPE STERI IOBAN 125X83 (DRAPES) ×2 IMPLANT
DRAPE U-SHAPE 47X51 STRL (DRAPES) ×4 IMPLANT
DRSG AQUACEL AG ADV 3.5X10 (GAUZE/BANDAGES/DRESSINGS) ×2 IMPLANT
DURAPREP 26ML APPLICATOR (WOUND CARE) ×4 IMPLANT
ELECT BLADE 4.0 EZ CLEAN MEGAD (MISCELLANEOUS) ×1
ELECT REM PT RETURN 9FT ADLT (ELECTROSURGICAL) ×1
ELECTRODE BLDE 4.0 EZ CLN MEGD (MISCELLANEOUS) ×2 IMPLANT
ELECTRODE REM PT RTRN 9FT ADLT (ELECTROSURGICAL) ×2 IMPLANT
GLOVE BIOGEL PI IND STRL 7.0 (GLOVE) ×4 IMPLANT
GLOVE BIOGEL PI IND STRL 7.5 (GLOVE) ×10 IMPLANT
GLOVE ECLIPSE 7.0 STRL STRAW (GLOVE) ×4 IMPLANT
GLOVE SKINSENSE STRL SZ7.5 (GLOVE) ×2 IMPLANT
GLOVE SURG SYN 7.5 E (GLOVE) ×2 IMPLANT
GLOVE SURG SYN 7.5 PF PI (GLOVE) ×4 IMPLANT
GLOVE SURG UNDER POLY LF SZ7 (GLOVE) ×6 IMPLANT
GLOVE SURG UNDER POLY LF SZ7.5 (GLOVE) ×4 IMPLANT
GOWN STRL REUS W/ TWL LRG LVL3 (GOWN DISPOSABLE) IMPLANT
GOWN STRL REUS W/ TWL XL LVL3 (GOWN DISPOSABLE) ×2 IMPLANT
GOWN STRL REUS W/TWL LRG LVL3 (GOWN DISPOSABLE)
GOWN STRL REUS W/TWL XL LVL3 (GOWN DISPOSABLE) ×1
GOWN STRL SURGICAL XL XLNG (GOWN DISPOSABLE) ×2 IMPLANT
GOWN TOGA ZIPPER T7+ PEEL AWAY (MISCELLANEOUS) ×4 IMPLANT
HANDPIECE INTERPULSE COAX TIP (DISPOSABLE) ×1
HEAD CERAMIC DELTA 36 PLUS 1.5 (Hips) IMPLANT
HOOD PEEL AWAY T7 (MISCELLANEOUS) ×2 IMPLANT
IV NS IRRIG 3000ML ARTHROMATIC (IV SOLUTION) ×2 IMPLANT
KIT BASIN OR (CUSTOM PROCEDURE TRAY) ×2 IMPLANT
LINER NEUTRAL 52X36MM PLUS 4 (Liner) IMPLANT
MARKER SKIN DUAL TIP RULER LAB (MISCELLANEOUS) ×2 IMPLANT
NDL SPNL 18GX3.5 QUINCKE PK (NEEDLE) ×2 IMPLANT
NEEDLE SPNL 18GX3.5 QUINCKE PK (NEEDLE) ×1 IMPLANT
PACK TOTAL JOINT (CUSTOM PROCEDURE TRAY) ×2 IMPLANT
PACK UNIVERSAL I (CUSTOM PROCEDURE TRAY) ×2 IMPLANT
PIN SECTOR W/GRIP ACE CUP 52MM (Hips) IMPLANT
SET HNDPC FAN SPRY TIP SCT (DISPOSABLE) ×2 IMPLANT
SOLUTION PRONTOSAN WOUND 350ML (IRRIGATION / IRRIGATOR) ×2 IMPLANT
STAPLER VISISTAT 35W (STAPLE) IMPLANT
STEM FEMORAL SZ5 HIGH ACTIS (Stem) IMPLANT
SUT ETHIBOND 2 V 37 (SUTURE) ×2 IMPLANT
SUT MON AB 3-0 SH 27 (SUTURE) ×1
SUT MON AB 3-0 SH27 (SUTURE) IMPLANT
SUT VIC AB 0 CT1 27 (SUTURE) ×1
SUT VIC AB 0 CT1 27XBRD ANBCTR (SUTURE) ×2 IMPLANT
SUT VIC AB 1 CTX 27 (SUTURE) IMPLANT
SUT VIC AB 1 CTX 36 (SUTURE) ×1
SUT VIC AB 1 CTX36XBRD ANBCTR (SUTURE) ×2 IMPLANT
SUT VIC AB 2-0 CT1 27 (SUTURE) ×3
SUT VIC AB 2-0 CT1 TAPERPNT 27 (SUTURE) ×4 IMPLANT
SYR 50ML LL SCALE MARK (SYRINGE) ×2 IMPLANT
TOWEL GREEN STERILE (TOWEL DISPOSABLE) ×2 IMPLANT
TUBE SUCT ARGYLE STRL (TUBING) ×2 IMPLANT
YANKAUER SUCT BULB TIP NO VENT (SUCTIONS) ×2 IMPLANT

## 2023-02-07 NOTE — Anesthesia Procedure Notes (Signed)
Procedure Name: Intubation Date/Time: 02/07/2023 7:22 AM  Performed by: Orlin Hilding, CRNAPre-anesthesia Checklist: Patient identified, Emergency Drugs available, Suction available, Patient being monitored and Timeout performed Patient Re-evaluated:Patient Re-evaluated prior to induction Oxygen Delivery Method: Circle system utilized Preoxygenation: Pre-oxygenation with 100% oxygen Induction Type: IV induction Ventilation: Mask ventilation without difficulty and Oral airway inserted - appropriate to patient size Laryngoscope Size: Mac and 4 Grade View: Grade III Tube type: Oral Tube size: 7.5 mm Number of attempts: 1 Placement Confirmation: ETT inserted through vocal cords under direct vision, positive ETCO2 and breath sounds checked- equal and bilateral Secured at: 23 cm Tube secured with: Tape Dental Injury: Teeth and Oropharynx as per pre-operative assessment

## 2023-02-07 NOTE — Anesthesia Preprocedure Evaluation (Signed)
Anesthesia Evaluation  Patient identified by MRN, date of birth, ID band Patient awake    Reviewed: Allergy & Precautions, NPO status , Patient's Chart, lab work & pertinent test results  Airway Mallampati: II  TM Distance: >3 FB Neck ROM: Full    Dental no notable dental hx.    Pulmonary Patient abstained from smoking., former smoker   Pulmonary exam normal        Cardiovascular negative cardio ROS Normal cardiovascular exam     Neuro/Psych  Headaches  negative psych ROS   GI/Hepatic negative GI ROS, Neg liver ROS,,,  Endo/Other  negative endocrine ROS    Renal/GU negative Renal ROS     Musculoskeletal  (+) Arthritis ,    Abdominal   Peds  Hematology negative hematology ROS (+)   Anesthesia Other Findings left hip degenerative joint disease  Reproductive/Obstetrics                             Anesthesia Physical Anesthesia Plan  ASA: 2  Anesthesia Plan: General   Post-op Pain Management: Tylenol PO (pre-op)* and Toradol IV (intra-op)*   Induction: Intravenous  PONV Risk Score and Plan: 1 and Ondansetron, Dexamethasone, Midazolam and Treatment may vary due to age or medical condition  Airway Management Planned: Oral ETT  Additional Equipment:   Intra-op Plan:   Post-operative Plan: Extubation in OR  Informed Consent: I have reviewed the patients History and Physical, chart, labs and discussed the procedure including the risks, benefits and alternatives for the proposed anesthesia with the patient or authorized representative who has indicated his/her understanding and acceptance.     Dental advisory given  Plan Discussed with: CRNA  Anesthesia Plan Comments:         Anesthesia Quick Evaluation

## 2023-02-07 NOTE — Op Note (Signed)
RIGHT TOTAL HIP REPLACEMENT  Procedure Note GREIG ALTERGOTT   [id number]  Pre-op Diagnosis: right hip osteoarthritis     Post-op Diagnosis: same  Operative Findings High grade cartilage loss of femoral head and acetabulum   Operative Procedures  1. Total hip replacement; Right hip; uncemented [id number]   Surgeon: Gershon Mussel, M.D.  Assist: Oneal Grout, PA-C   Anesthesia: general  Prosthesis: Depuy Acetabulum: Pinnacle 52 mm Femur: Actis 5 HO Head: 36 mm size: +1.5 Liner: +4 Bearing Type: ceramic/poly  Total Hip Arthroplasty (Anterior Approach) Op Note:  After informed consent was obtained and the operative extremity marked in the holding area, the patient was brought back to the operating room and placed supine on the HANA table. Next, the operative extremity was prepped and draped in normal sterile fashion. Surgical timeout occurred verifying patient identification, surgical site, surgical procedure and administration of antibiotics.  A 10 cm longitudinal incision was made starting from 2 fingerbreadths lateral and inferior to the ASIS towards the lateral aspect of the patella.  A Hueter approach to the hip was performed, using the interval between tensor fascia lata and sartorius.  Dissection was carried bluntly down onto the anterior hip capsule. The lateral femoral circumflex vessels were identified and coagulated. A capsulotomy was performed and the capsular flaps tagged for later repair.  The neck osteotomy was performed. The femoral head was removed which showed high grade cartilage loss, the acetabular rim was cleared of soft tissue and osteophytes and attention was turned to reaming the acetabulum.  Sequential reaming was performed under fluoroscopic guidance down to the floor of the cotyloid fossa. We reamed to a size 51 mm, and then impacted the acetabular shell. The liner was then placed after irrigation and attention turned to the femur.  After placing the  femoral hook, the leg was taken to externally rotated, extended and adducted position taking care to perform soft tissue releases to allow for adequate mobilization of the femur. Soft tissue was cleared from the shoulder of the greater trochanter and the hook elevator used to improve exposure of the proximal femur. Sequential broaching performed up to a size 5. Trial neck and head were placed. The leg was brought back up to neutral and the construct reduced.  The position and sizing of components, offset and leg lengths were checked using fluoroscopy. Stability of the construct was checked in 45 degrees of hip extension and 90 degrees of external rotation without any subluxation, shuck or impingement of prosthesis. We dislocated the prosthesis, dropped the leg back into position, removed trial components, and irrigated copiously. The final stem and head was then placed, the leg brought back up, the system reduced and fluoroscopy used to verify positioning.  Antibiotic irrigation was placed in the surgical wound.   We irrigated, obtained hemostasis and closed the capsule using #2 ethibond suture.  A topical mixture of 0.25% bupivacaine and meloxicam was placed deep to the fascia.  One gram of vancomycin powder was placed in the surgical bed.   One gram of topical tranexamic acid was injected into the joint.  The fascia was closed with #1 vicryl plus, the deep fat layer was closed with 0 vicryl, the subcutaneous layers closed with 2.0 Vicryl Plus and the skin closed with 3.0 monocryl and steri strips. A sterile dressing was applied. The patient was awakened in the operating room and taken to recovery in stable condition.  All sponge, needle, and instrument counts were correct at the end of the  case.   Tessa Lerner, my PA, was a medical necessity for opening, closing, limb positioning, retracting, exposing, and overall facilitation and timely completion of the surgery.  Position: supine  Complications: see  description of procedure.  Time Out: performed   Drains/Packing: none  Estimated blood loss: see anesthesia record  Returned to Recovery Room: in good condition.   Antibiotics: yes   Mechanical VTE (DVT) Prophylaxis: sequential compression devices, TED thigh-high  Chemical VTE (DVT) Prophylaxis: aspirin   Fluid Replacement: see anesthesia record  Specimens Removed: 1 to pathology   Sponge and Instrument Count Correct? yes   PACU: portable radiograph - low AP   Plan/RTC: Return in 2 weeks for staple removal. Weight Bearing/Load Lower Extremity: full  Hip precautions: none Suture Removal: 2 weeks   N. Glee Arvin, MD [facility] 8:36 AM   Implant Name Type Inv. Item Serial No. Manufacturer Lot No. LRB No. Used Action  PIN SECTOR W/GRIP ACE CUP - [id number] Hips PIN SECTOR W/GRIP ACE CUP  [employer] [id number] Right 1 Implanted  LINER NEUTRAL 52X36MM PLUS 4 - [id number] Liner LINER NEUTRAL 52X36MM PLUS 4  [employer]PU[employer] [id number] Right 1 Implanted  STEM FEMORAL SZ5 HIGH ACTIS - [id number] Stem STEM FEMORAL SZ5 HIGH ACTIS  [employer] [id number] Right 1 Implanted  HEAD CERAMIC DELTA 36 PLUS 1.5 - [id number] Hips HEAD CERAMIC DELTA 36 PLUS 1.5  [employer] [id number] Right 1 Implanted

## 2023-02-07 NOTE — Plan of Care (Signed)
  Problem: Education: Goal: Knowledge of General Education information will improve Description: Including pain rating scale, medication(s)/side effects and non-pharmacologic comfort measures 02/07/2023 1338 by Janey Genta, RN Outcome: Progressing 02/07/2023 1337 by Janey Genta, RN Outcome: Progressing   Problem: Health Behavior/Discharge Planning: Goal: Ability to manage health-related needs will improve 02/07/2023 1338 by Janey Genta, RN Outcome: Progressing 02/07/2023 1337 by Janey Genta, RN Outcome: Progressing   Problem: Clinical Measurements: Goal: Ability to maintain clinical measurements within normal limits will improve 02/07/2023 1338 by Janey Genta, RN Outcome: Progressing 02/07/2023 1337 by Janey Genta, RN Outcome: Progressing Goal: Will remain free from infection 02/07/2023 1338 by Janey Genta, RN Outcome: Progressing 02/07/2023 1337 by Janey Genta, RN Outcome: Progressing Goal: Diagnostic test results will improve 02/07/2023 1338 by Janey Genta, RN Outcome: Progressing 02/07/2023 1337 by Janey Genta, RN Outcome: Progressing Goal: Respiratory complications will improve 02/07/2023 1338 by Janey Genta, RN Outcome: Progressing 02/07/2023 1337 by Janey Genta, RN Outcome: Progressing Goal: Cardiovascular complication will be avoided 02/07/2023 1338 by Janey Genta, RN Outcome: Progressing 02/07/2023 1337 by Janey Genta, RN Outcome: Progressing   Problem: Activity: Goal: Risk for activity intolerance will decrease 02/07/2023 1338 by Janey Genta, RN Outcome: Progressing 02/07/2023 1337 by Janey Genta, RN Outcome: Progressing   Problem: Nutrition: Goal: Adequate nutrition will be maintained 02/07/2023 1338 by Janey Genta, RN Outcome: Progressing 02/07/2023 1337 by Janey Genta, RN Outcome: Progressing   Problem: Coping: Goal: Level of anxiety will decrease 02/07/2023 1338 by Janey Genta, RN Outcome:  Progressing 02/07/2023 1337 by Janey Genta, RN Outcome: Progressing   Problem: Elimination: Goal: Will not experience complications related to bowel motility 02/07/2023 1338 by Janey Genta, RN Outcome: Progressing 02/07/2023 1337 by Janey Genta, RN Outcome: Progressing Goal: Will not experience complications related to urinary retention 02/07/2023 1338 by Janey Genta, RN Outcome: Progressing 02/07/2023 1337 by Janey Genta, RN Outcome: Progressing   Problem: Pain Management: Goal: General experience of comfort will improve 02/07/2023 1338 by Janey Genta, RN Outcome: Progressing 02/07/2023 1337 by Janey Genta, RN Outcome: Progressing   Problem: Safety: Goal: Ability to remain free from injury will improve 02/07/2023 1338 by Janey Genta, RN Outcome: Progressing 02/07/2023 1337 by Janey Genta, RN Outcome: Progressing   Problem: Skin Integrity: Goal: Risk for impaired skin integrity will decrease 02/07/2023 1338 by Janey Genta, RN Outcome: Progressing 02/07/2023 1337 by Janey Genta, RN Outcome: Progressing   Problem: Education: Goal: Knowledge of the prescribed therapeutic regimen will improve 02/07/2023 1338 by Janey Genta, RN Outcome: Progressing 02/07/2023 1337 by Janey Genta, RN Outcome: Progressing Goal: Understanding of discharge needs will improve 02/07/2023 1338 by Janey Genta, RN Outcome: Progressing 02/07/2023 1337 by Janey Genta, RN Outcome: Progressing Goal: Individualized Educational Video(s) 02/07/2023 1338 by Janey Genta, RN Outcome: Progressing 02/07/2023 1337 by Janey Genta, RN Outcome: Progressing

## 2023-02-07 NOTE — Discharge Instructions (Signed)

## 2023-02-07 NOTE — Transfer of Care (Signed)
Immediate Anesthesia Transfer of Care Note  Patient: Randy Park  Procedure(s) Performed: RIGHT TOTAL HIP REPLACEMENT (Right: Hip)  Patient Location: PACU  Anesthesia Type:General  Level of Consciousness: drowsy and patient cooperative  Airway & Oxygen Therapy: Patient Spontanous Breathing and Patient connected to face mask oxygen  Post-op Assessment: Report given to RN and Post -op Vital signs reviewed and stable  Post vital signs: Reviewed and stable  Last Vitals:  Vitals Value Taken Time  BP 132/97 02/07/23 0916  Temp    Pulse 88 02/07/23 0923  Resp 21 02/07/23 0923  SpO2 99 % 02/07/23 0923  Vitals shown include unfiled device data.  Last Pain:  Vitals:   02/07/23 0626  TempSrc:   PainSc: 6       Patients Stated Pain Goal: 1 (02/07/23 0626)  Complications: No notable events documented.

## 2023-02-07 NOTE — Evaluation (Signed)
Physical Therapy Evaluation Patient Details Name: Randy Park MRN: [id number] DOB: 09/17/1960 Today's Date: 02/07/2023  History of Present Illness  Pt is 62yo male who presents TO [facility] hospital on 02/07/23 for elective R THA, anterior direct approach. PMH: OA, GERD, colitis  Clinical Impression  Pt presents to PT with deficits in strength and ROM in the RLE. The pt was able to tolerate walking household distances with the use of a RW. Ambulation distance was limited by the pt reports of feeling dizzy and hot. The pt was educated on THA exercise booklet and weight bearing status. PT will focus on gait training and reviewing THA exercise packet the next session. The pt will continue to benefit from skilled PT to address remaining functional distances.      If plan is discharge home, recommend the following: A little help with walking and/or transfers;Assistance with cooking/housework;Assist for transportation;Help with stairs or ramp for entrance   Can travel by private vehicle        Equipment Recommendations None recommended by PT  Recommendations for Other Services       Functional Status Assessment Patient has had a recent decline in their functional status and demonstrates the ability to make significant improvements in function in a reasonable and predictable amount of time.     Precautions / Restrictions Precautions Precautions: Fall Restrictions Weight Bearing Restrictions: Yes RLE Weight Bearing: Weight bearing as tolerated      Mobility  Bed Mobility Overal bed mobility: Modified Independent                  Transfers Overall transfer level: Needs assistance Equipment used: Rolling walker (2 wheels) Transfers: Sit to/from Stand Sit to Stand: Contact guard assist                Ambulation/Gait Ambulation/Gait assistance: Contact guard assist Gait Distance (Feet): 20 Feet Assistive device: Rolling walker (2 wheels) Gait Pattern/deviations:  Step-to pattern Gait velocity: SLOW Gait velocity interpretation: <1.8 ft/sec, indicate of risk for recurrent falls      Stairs            Wheelchair Mobility     Tilt Bed    Modified Rankin (Stroke Patients Only)       Balance Overall balance assessment: Needs assistance Sitting-balance support: Feet supported, No upper extremity supported Sitting balance-Leahy Scale: Good     Standing balance support: Bilateral upper extremity supported, During functional activity Standing balance-Leahy Scale: Poor                               Pertinent Vitals/Pain Pain Assessment Pain Assessment: Faces Pain Location: R hip Pain Descriptors / Indicators: Grimacing Pain Intervention(s): Monitored during session    Home Living Family/patient expects to be discharged to:: Private residence Living Arrangements: Spouse/significant other Available Help at Discharge: Family;Available 24 hours/day Type of Home: House Home Access: Level entry       Home Layout: One level Home Equipment: Cane - single Librarian, academic (2 wheels)      Prior Function Prior Level of Function : Independent/Modified Independent;Driving                     Extremity/Trunk Assessment   Upper Extremity Assessment Upper Extremity Assessment: Overall WFL for tasks assessed    Lower Extremity Assessment Lower Extremity Assessment: RLE deficits/detail RLE Deficits / Details: weakness in RLE as expected post-op day 0. RLE Sensation: WNL RLE  Coordination: WNL    Cervical / Trunk Assessment Cervical / Trunk Assessment: Normal  Communication   Communication Communication: No apparent difficulties Cueing Techniques: Verbal cues;Visual cues  Cognition Arousal: Alert Behavior During Therapy: WFL for tasks assessed/performed Overall Cognitive Status: Within Functional Limits for tasks assessed                                          General Comments  General comments (skin integrity, edema, etc.): Pt reported feeling dizzy during ambulation    Exercises     Assessment/Plan    PT Assessment Patient needs continued PT services  PT Problem List Decreased strength;Decreased range of motion;Decreased activity tolerance;Decreased balance;Decreased mobility       PT Treatment Interventions Gait training;Stair training;Functional mobility training;Therapeutic activities;Therapeutic exercise;Balance training;Neuromuscular re-education    PT Goals (Current goals can be found in the Care Plan section)  Acute Rehab PT Goals Patient Stated Goal: to return home PT Goal Formulation: With patient Time For Goal Achievement: 02/11/23 Potential to Achieve Goals: Good    Frequency Min 1X/week     Co-evaluation               AM-PAC PT "6 Clicks" Mobility  Outcome Measure Help needed turning from your back to your side while in a flat bed without using bedrails?: A Little Help needed moving from lying on your back to sitting on the side of a flat bed without using bedrails?: A Little Help needed moving to and from a bed to a chair (including a wheelchair)?: A Little Help needed standing up from a chair using your arms (e.g., wheelchair or bedside chair)?: A Little Help needed to walk in hospital room?: A Little Help needed climbing 3-5 steps with a railing? : A Little 6 Click Score: 18    End of Session Equipment Utilized During Treatment: Gait belt Activity Tolerance: Treatment limited secondary to medical complications (Comment) (Limited by dizziness) Patient left: in bed;with call bell/phone within reach;with family/visitor present Nurse Communication: Mobility status PT Visit Diagnosis: Unsteadiness on feet (R26.81);Muscle weakness (generalized) (M62.81);Pain Pain - Right/Left: Right Pain - part of body: Hip    Time: 0981-1914 PT Time Calculation (min) (ACUTE ONLY): 19 min   Charges:   PT Evaluation $PT Eval Low  Complexity: 1 Low   PT General Charges $$ ACUTE PT VISIT: 1 Visit        Caryl Comes, SPT Acute Rehabilitation Office Phone [phone]  Caryl Comes 02/07/2023, 1:57 PM
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
## 2023-02-08 ENCOUNTER — Encounter (HOSPITAL_COMMUNITY): Payer: Self-pay | Admitting: Orthopaedic Surgery

## 2023-02-08 DIAGNOSIS — M1611 Unilateral primary osteoarthritis, right hip: Secondary | ICD-10-CM | POA: Diagnosis not present

## 2023-02-08 NOTE — Progress Notes (Signed)
Physical Therapy Treatment Patient Details Name: Randy Park MRN: [id number] DOB: 04-30-1960 Today's Date: 02/08/2023   History of Present Illness Pt is 62yo male who presents TO [facility] hospital on 02/07/23 for elective R THA, anterior direct approach. PMH: OA, GERD, colitis    PT Comments  Pt greeted up in room and agreeable to session with good progress towards acute goals. Pt requiring grossly supervision for transfers with safe hand placement, and CGA fading to supervision for gait with RW for support. Pt benefiting from light cues for forward gaze and increased heel strike on R with pt able to correct. Pt was educated on continued walker use to maximize functional independence, safety, and decrease risk for falls as well as appropriate activity progression, IS use, safe car entry/ext and HEP and compliance with pt verbalizing/demonstrating understanding. Anticipate safe discharge, with assist level outlined below, once medically cleared, will continue to follow acutely.     If plan is discharge home, recommend the following: A little help with walking and/or transfers;Assistance with cooking/housework;Assist for transportation;Help with stairs or ramp for entrance   Can travel by private vehicle        Equipment Recommendations  None recommended by PT    Recommendations for Other Services       Precautions / Restrictions Precautions Precautions: Fall Restrictions Weight Bearing Restrictions: Yes RLE Weight Bearing: Weight bearing as tolerated     Mobility  Bed Mobility Overal bed mobility: Modified Independent             General bed mobility comments: pt up in roomon arrival    Transfers Overall transfer level: Needs assistance Equipment used: Rolling walker (2 wheels) Transfers: Sit to/from Stand Sit to Stand: Supervision           General transfer comment: supervision for safety, good hand placement    Ambulation/Gait Ambulation/Gait assistance:  Contact guard assist, Supervision Gait Distance (Feet): 295 Feet Assistive device: Rolling walker (2 wheels) Gait Pattern/deviations: Step-through pattern, Decreased stride length, Decreased weight shift to right Gait velocity: slowed     General Gait Details: steady gait with RW, light cues for forward gaze, CGA fading to supervision with increased distance   Stairs             Wheelchair Mobility     Tilt Bed    Modified Rankin (Stroke Patients Only)       Balance Overall balance assessment: Needs assistance Sitting-balance support: Feet supported, No upper extremity supported Sitting balance-Leahy Scale: Good     Standing balance support: Bilateral upper extremity supported, During functional activity Standing balance-Leahy Scale: Poor Standing balance comment: realint on UE support                            Cognition Arousal: Alert Behavior During Therapy: WFL for tasks assessed/performed Overall Cognitive Status: Within Functional Limits for tasks assessed                                          Exercises Total Joint Exercises Marching in Standing: AROM, Right, 10 reps, Standing    General Comments General comments (skin integrity, edema, etc.): VSS on RA, pt reporting no BM and feeling bloated      Pertinent Vitals/Pain Pain Assessment Pain Assessment: 0-10 Pain Score: 7  Pain Location: R hip Pain Descriptors / Indicators: Grimacing Pain  Intervention(s): Monitored during session, Limited activity within patient's tolerance    Home Living                          Prior Function            PT Goals (current goals can now be found in the care plan section) Acute Rehab PT Goals Patient Stated Goal: to return home PT Goal Formulation: With patient Time For Goal Achievement: 02/11/23 Progress towards PT goals: Progressing toward goals    Frequency    Min 1X/week      PT Plan       Co-evaluation              AM-PAC PT "6 Clicks" Mobility   Outcome Measure  Help needed turning from your back to your side while in a flat bed without using bedrails?: A Little Help needed moving from lying on your back to sitting on the side of a flat bed without using bedrails?: A Little Help needed moving to and from a bed to a chair (including a wheelchair)?: A Little Help needed standing up from a chair using your arms (e.g., wheelchair or bedside chair)?: A Little Help needed to walk in hospital room?: A Little Help needed climbing 3-5 steps with a railing? : A Little 6 Click Score: 18    End of Session Equipment Utilized During Treatment: Gait belt Activity Tolerance: Patient tolerated treatment well Patient left: in bed;with call bell/phone within reach (seated up EOB) Nurse Communication: Mobility status PT Visit Diagnosis: Unsteadiness on feet (R26.81);Muscle weakness (generalized) (M62.81);Pain Pain - Right/Left: Right Pain - part of body: Hip     Time: 0851-0907 PT Time Calculation (min) (ACUTE ONLY): 16 min  Charges:    $Gait Training: 8-22 mins PT General Charges $$ ACUTE PT VISIT: 1 Visit                     Leeandre Nordling R. PTA Acute Rehabilitation Services Office: [phone]   Catalina Antigua 02/08/2023, 9:12 AM

## 2023-02-08 NOTE — Progress Notes (Signed)
Patient alert and oriented, void, ambulate, surgical site clean and dry no sign of infection. D/c instructions explain and given. Pt. D/c home per order.

## 2023-02-08 NOTE — Anesthesia Postprocedure Evaluation (Signed)
Anesthesia Post Note  Patient: Randy Park  Procedure(s) Performed: RIGHT TOTAL HIP REPLACEMENT (Right: Hip)     Patient location during evaluation: PACU Anesthesia Type: General Level of consciousness: awake and alert Pain management: pain level controlled Vital Signs Assessment: post-procedure vital signs reviewed and stable Respiratory status: spontaneous breathing, nonlabored ventilation, respiratory function stable and patient connected to nasal cannula oxygen Cardiovascular status: blood pressure returned to baseline and stable Postop Assessment: no apparent nausea or vomiting Anesthetic complications: no   No notable events documented.  Last Vitals:  Vitals:   02/08/23 0323 02/08/23 0808  BP: 124/73 116/65  Pulse: 64 60  Resp: 20 16  Temp: 36.6 C 36.8 C  SpO2: 93% 98%    Last Pain:  Vitals:   02/08/23 1108  TempSrc:   PainSc: 6                  Kennieth Rad

## 2023-02-08 NOTE — Progress Notes (Signed)
Subjective: 1 Day Post-Op Procedure(s) (LRB): RIGHT TOTAL HIP REPLACEMENT (Right) Patient reports pain as moderate.    Objective: Vital signs in last 24 hours: Temp:  [97 F (36.1 C)-98.9 F (37.2 C)] 98.3 F (36.8 C) (11/05 0808) Pulse Rate:  [57-72] 60 (11/05 0808) Resp:  [10-20] 16 (11/05 0808) BP: (116-139)/(65-87) 116/65 (11/05 0808) SpO2:  [92 %-99 %] 98 % (11/05 0808)  Intake/Output from previous day: 11/04 0701 - 11/05 0700 In: 2890 [P.O.:1440; I.V.:1200; IV Piggyback:250] Out: 1300 [Urine:1150; Blood:150] Intake/Output this shift: No intake/output data recorded.  No results for input(s): "HGB" in the last 72 hours. No results for input(s): "WBC", "RBC", "HCT", "PLT" in the last 72 hours. No results for input(s): "NA", "K", "CL", "CO2", "BUN", "CREATININE", "GLUCOSE", "CALCIUM" in the last 72 hours. No results for input(s): "LABPT", "INR" in the last 72 hours.  Neurologically intact Neurovascular intact Sensation intact distally Intact pulses distally Dorsiflexion/Plantar flexion intact Incision: scant drainage No cellulitis present Compartment soft   Assessment/Plan: 1 Day Post-Op Procedure(s) (LRB): RIGHT TOTAL HIP REPLACEMENT (Right) Advance diet Up with therapy D/C IV fluids Discharge home with home health WBAT RLE      Randy Park 02/08/2023, 9:54 AM

## 2023-02-08 NOTE — Plan of Care (Signed)
  Problem: Education: Goal: Knowledge of General Education information will improve Description: Including pain rating scale, medication(s)/side effects and non-pharmacologic comfort measures Outcome: Completed/Met   Problem: Health Behavior/Discharge Planning: Goal: Ability to manage health-related needs will improve Outcome: Completed/Met   Problem: Clinical Measurements: Goal: Ability to maintain clinical measurements within normal limits will improve Outcome: Completed/Met Goal: Will remain free from infection Outcome: Completed/Met Goal: Diagnostic test results will improve Outcome: Completed/Met Goal: Respiratory complications will improve Outcome: Completed/Met Goal: Cardiovascular complication will be avoided Outcome: Completed/Met   Problem: Activity: Goal: Risk for activity intolerance will decrease Outcome: Completed/Met   Problem: Nutrition: Goal: Adequate nutrition will be maintained Outcome: Completed/Met   Problem: Coping: Goal: Level of anxiety will decrease Outcome: Completed/Met   Problem: Elimination: Goal: Will not experience complications related to bowel motility Outcome: Completed/Met Goal: Will not experience complications related to urinary retention Outcome: Completed/Met   Problem: Pain Management: Goal: General experience of comfort will improve Outcome: Completed/Met   Problem: Skin Integrity: Goal: Risk for impaired skin integrity will decrease Outcome: Completed/Met   Problem: Education: Goal: Knowledge of the prescribed therapeutic regimen will improve Outcome: Completed/Met Goal: Understanding of discharge needs will improve Outcome: Completed/Met Goal: Individualized Educational Video(s) Outcome: Completed/Met   Problem: Activity: Goal: Ability to avoid complications of mobility impairment will improve Outcome: Completed/Met Goal: Ability to tolerate increased activity will improve Outcome: Completed/Met   Problem:  Clinical Measurements: Goal: Postoperative complications will be avoided or minimized Outcome: Completed/Met   Problem: Pain Management: Goal: Pain level will decrease with appropriate interventions Outcome: Completed/Met   Problem: Skin Integrity: Goal: Will show signs of wound healing Outcome: Completed/Met

## 2023-02-08 NOTE — Discharge Summary (Signed)
Patient ID: Randy Park MRN: [id number] DOB/AGE: 60-May-1962 60 y.o.  Admit date: 02/07/2023 Discharge date: 02/08/2023  Admission Diagnoses:  Principal Problem:   Primary osteoarthritis of right hip Active Problems:   Status post total replacement of right hip   Discharge Diagnoses:  Same  Past Medical History:  Diagnosis Date   Arthritis 2023   osteoarthritis left hip   COLITIS 08/26/2007   Qualifier: Diagnosis of  By: Alesia Richards     Diverticulosis    GERD (gastroesophageal reflux disease)    Headache    HX MIGRAINES   Hiatal hernia    Hyperplastic colonic polyp - colonoscopy 2008 08/26/2007   Qualifier: Diagnosis of  By: Alesia Richards      Surgeries: Procedure(s): RIGHT TOTAL HIP REPLACEMENT on 02/07/2023   Consultants:   Discharged Condition: Improved  Hospital Course: NORMON PETTIJOHN is an 62 y.o. male who was admitted 02/07/2023 for operative treatment ofPrimary osteoarthritis of right hip. Patient has severe unremitting pain that affects sleep, daily activities, and work/hobbies. After pre-op clearance the patient was taken to the operating room on 02/07/2023 and underwent  Procedure(s): RIGHT TOTAL HIP REPLACEMENT.    Patient was given perioperative antibiotics:  Anti-infectives (From admission, onward)    Start     Dose/Rate Route Frequency Ordered Stop   02/07/23 1430  ceFAZolin (ANCEF) IVPB 2g/100 mL premix        2 g 200 mL/hr over 30 Minutes Intravenous Every 6 hours 02/07/23 1014 02/07/23 2142   02/07/23 0817  vancomycin (VANCOCIN) powder  Status:  Discontinued          As needed 02/07/23 0817 02/07/23 0913   02/07/23 0600  ceFAZolin (ANCEF) IVPB 2g/100 mL premix        2 g 200 mL/hr over 30 Minutes Intravenous On call to O.R. 02/07/23 5366 02/07/23 0725        Patient was given sequential compression devices, early ambulation, and chemoprophylaxis to prevent DVT.  Patient benefited maximally from hospital stay and there  were no complications.    Recent vital signs: Patient Vitals for the past 24 hrs:  BP Temp Temp src Pulse Resp SpO2  02/08/23 0808 116/65 98.3 F (36.8 C) Oral 60 16 98 %  02/08/23 0323 124/73 97.9 F (36.6 C) Oral 64 20 93 %  02/08/23 0000 118/65 97.9 F (36.6 C) Oral 72 20 92 %  02/07/23 1944 133/68 98.4 F (36.9 C) Oral 63 20 96 %  02/07/23 1526 139/87 98 F (36.7 C) -- (!) 57 20 97 %  02/07/23 1021 133/81 98.9 F (37.2 C) -- 63 20 99 %  02/07/23 1000 135/68 (!) 97 F (36.1 C) -- 67 10 98 %  02/07/23 0945 (!) 132/57 -- -- 66 14 97 %     Recent laboratory studies: No results for input(s): "WBC", "HGB", "HCT", "PLT", "NA", "K", "CL", "CO2", "BUN", "CREATININE", "GLUCOSE", "INR", "CALCIUM" in the last 72 hours.  Invalid input(s): "PT", "2"   Discharge Medications:   Allergies as of 02/08/2023       Reactions   Amoxicillin Rash        Medication List     STOP taking these medications    acetaminophen 650 MG CR tablet Commonly known as: TYLENOL   ibuprofen 800 MG tablet Commonly known as: ADVIL   ketorolac 10 MG tablet Commonly known as: TORADOL   triamcinolone cream 0.1 % Commonly known as: KENALOG       TAKE these  medications    aspirin EC 81 MG tablet Take 1 tablet (81 mg total) by mouth 2 (two) times daily. To be taken after surgery to prevent blood clots What changed: Another medication with the same name was removed. Continue taking this medication, and follow the directions you see here.   atorvastatin 10 MG tablet Commonly known as: LIPITOR Take 1 tablet (10 mg total) by mouth daily.   docusate sodium 100 MG capsule Commonly known as: Colace Take 1 capsule (100 mg total) by mouth daily as needed. What changed: Another medication with the same name was removed. Continue taking this medication, and follow the directions you see here.   HYDROmorphone 2 MG tablet Commonly known as: Dilaudid Take 1 tablet (2 mg total) by mouth 2 (two) times  daily as needed for severe pain (pain score 7-10). For breakthrough pain   methocarbamol 750 MG tablet Commonly known as: Robaxin-750 Take 1 tablet (750 mg total) by mouth 2 (two) times daily as needed for muscle spasms. What changed: Another medication with the same name was removed. Continue taking this medication, and follow the directions you see here.   ondansetron 4 MG tablet Commonly known as: Zofran Take 1 tablet (4 mg total) by mouth every 8 (eight) hours as needed for nausea or vomiting. What changed: Another medication with the same name was removed. Continue taking this medication, and follow the directions you see here.   oxyCODONE-acetaminophen 5-325 MG tablet Commonly known as: Percocet Take 1-2 tablets by mouth every 6 (six) hours as needed. To be taken after surgery   tamsulosin 0.4 MG Caps capsule Commonly known as: FLOMAX Take 1 capsule (0.4 mg total) by mouth daily.               Durable Medical Equipment  (From admission, onward)           Start     Ordered   02/07/23 1015  DME Walker rolling  Once       Question:  Patient needs a walker to treat with the following condition  Answer:  History of hip replacement   02/07/23 1014   02/07/23 1015  DME 3 n 1  Once        02/07/23 1014   02/07/23 1015  DME Bedside commode  Once       Question:  Patient needs a bedside commode to treat with the following condition  Answer:  History of hip replacement   02/07/23 1014            Diagnostic Studies: DG HIP UNILAT WITH PELVIS 1V RIGHT  Result Date: 02/07/2023 CLINICAL DATA:  Right hip arthroplasty.  Intraoperative fluoroscopy. EXAM: DG HIP (WITH OR WITHOUT PELVIS) 1V RIGHT COMPARISON:  Pelvis and right hip radiographs 12/03/2022 FINDINGS: Images were performed intraoperatively without the presence of a radiologist. New total right hip arthroplasty placement. Redemonstration of prior total left hip arthroplasty, partially visualized. No hardware  complication is seen. Total fluoroscopy images: 4 Total fluoroscopy time: 29 seconds Total dose: Radiation Exposure Index (as provided by the fluoroscopic device): 3.19 mGy air Kerma Please see intraoperative findings for further detail. IMPRESSION: Intraoperative fluoroscopy for total right hip arthroplasty placement. Electronically Signed   By: Neita Garnet M.D.   On: 02/07/2023 11:19   DG Pelvis Portable  Result Date: 02/07/2023 CLINICAL DATA:  Postop. EXAM: PORTABLE PELVIS 1-2 VIEWS COMPARISON:  None Available. FINDINGS: Right hip arthroplasty in expected alignment. No periprosthetic lucency or fracture. Recent postsurgical change includes  air and edema in the soft tissues. Previous left hip arthroplasty. IMPRESSION: Right hip arthroplasty without immediate postoperative complication. Electronically Signed   By: Narda Rutherford M.D.   On: 02/07/2023 11:04   DG C-Arm 1-60 Min-No Report  Result Date: 02/07/2023 Fluoroscopy was utilized by the requesting physician.  No radiographic interpretation.   DG C-Arm 1-60 Min-No Report  Result Date: 02/07/2023 Fluoroscopy was utilized by the requesting physician.  No radiographic interpretation.    Disposition: Discharge disposition: 01-Home or Self Care          Follow-up Information     Cristie Hem, PA-C. Schedule an appointment as soon as possible for a visit in 2 week(s).   Specialty: Orthopedic Surgery Contact information: [address] [place] [address] [phone]         [facility] Follow up.   Why: The home health agency will contact you for the first home visit. Contact information: [address] [place] [address] [phone]                  Signed: Cristie Hem 02/08/2023, 9:44 AM

## 2023-02-09 ENCOUNTER — Other Ambulatory Visit: Payer: Self-pay | Admitting: Physician Assistant

## 2023-02-09 ENCOUNTER — Telehealth: Payer: Self-pay | Admitting: Orthopaedic Surgery

## 2023-02-09 ENCOUNTER — Telehealth: Payer: Self-pay | Admitting: Physician Assistant

## 2023-02-09 MED ORDER — OXYCODONE-ACETAMINOPHEN 5-325 MG PO TABS: 1.0000 | ORAL_TABLET | Freq: Four times a day (QID) | ORAL | 0 refills | Status: DC | PRN

## 2023-02-09 NOTE — Telephone Encounter (Signed)
Pt called requesting a refill and medication to take in between hydromorphone. Please call pt about this matter at [phone].

## 2023-02-09 NOTE — Telephone Encounter (Signed)
Called and gave verbal 

## 2023-02-09 NOTE — Telephone Encounter (Signed)
Charisse (PT) from [facility] called requesting verbal orders. Requesting 2wk 1, 3wk, 1 and 1wk 1. Pt complaints pain level 8 out of 10. Charisse states pt has 4 pills left. Please call Charisse on secure line at [phone]. Also asking for pt pain meds to be refilled and to see last note for medication in between dosage of hydroomorphone.

## 2023-02-09 NOTE — Telephone Encounter (Signed)
Sent in percocet

## 2023-02-10 ENCOUNTER — Telehealth: Payer: Self-pay | Admitting: Orthopaedic Surgery

## 2023-02-10 ENCOUNTER — Other Ambulatory Visit: Payer: Self-pay | Admitting: Physician Assistant

## 2023-02-10 MED ORDER — HYDROMORPHONE HCL 2 MG PO TABS: 2.0000 mg | ORAL_TABLET | Freq: Two times a day (BID) | ORAL | 0 refills | Status: DC | PRN

## 2023-02-10 NOTE — Telephone Encounter (Signed)
sent 

## 2023-02-10 NOTE — Telephone Encounter (Signed)
Patient notified

## 2023-02-10 NOTE — Telephone Encounter (Signed)
Patient called to get a refill on pain medication. ZO#[phone]

## 2023-02-15 ENCOUNTER — Ambulatory Visit (INDEPENDENT_AMBULATORY_CARE_PROVIDER_SITE_OTHER): Payer: 59 | Admitting: Physician Assistant

## 2023-02-15 DIAGNOSIS — Z96641 Presence of right artificial hip joint: Secondary | ICD-10-CM
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
## 2023-02-17 ENCOUNTER — Other Ambulatory Visit: Payer: Self-pay | Admitting: Physician Assistant

## 2023-02-17 ENCOUNTER — Telehealth: Payer: Self-pay | Admitting: Orthopaedic Surgery

## 2023-02-17 MED ORDER — OXYCODONE-ACETAMINOPHEN 5-325 MG PO TABS: 1.0000 | ORAL_TABLET | Freq: Four times a day (QID) | ORAL | 0 refills | Status: DC | PRN

## 2023-02-17 MED ORDER — METHOCARBAMOL 750 MG PO TABS: 750.0000 mg | ORAL_TABLET | Freq: Two times a day (BID) | ORAL | 2 refills | Status: DC | PRN

## 2023-02-17 NOTE — Telephone Encounter (Signed)
Sent to pharmacy on file 

## 2023-02-17 NOTE — Telephone Encounter (Signed)
Patient called and needs a refill on the muscle relaxer and oxycodone. ZD#[phone]

## 2023-02-22 ENCOUNTER — Encounter: Payer: 59 | Admitting: Physician Assistant

## 2023-02-22 ENCOUNTER — Encounter: Payer: Self-pay | Admitting: Physician Assistant

## 2023-02-22 ENCOUNTER — Ambulatory Visit (INDEPENDENT_AMBULATORY_CARE_PROVIDER_SITE_OTHER): Payer: 59 | Admitting: Physician Assistant

## 2023-02-22 DIAGNOSIS — Z96641 Presence of right artificial hip joint: Secondary | ICD-10-CM
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
## 2023-02-28 ENCOUNTER — Telehealth: Payer: Self-pay | Admitting: Orthopaedic Surgery

## 2023-02-28 MED ORDER — HYDROCODONE-ACETAMINOPHEN 5-325 MG PO TABS: 1.0000 | ORAL_TABLET | Freq: Every day | ORAL | 0 refills | Status: DC | PRN

## 2023-02-28 MED ORDER — METHOCARBAMOL 750 MG PO TABS: 750.0000 mg | ORAL_TABLET | Freq: Two times a day (BID) | ORAL | 2 refills | Status: DC | PRN

## 2023-02-28 MED ORDER — ONDANSETRON HCL 4 MG PO TABS: 4.0000 mg | ORAL_TABLET | Freq: Three times a day (TID) | ORAL | 0 refills | Status: DC | PRN

## 2023-02-28 NOTE — Telephone Encounter (Signed)
Notified patient.

## 2023-02-28 NOTE — Addendum Note (Signed)
Addended by: Mayra Reel on: 02/28/2023 01:01 PM   Modules accepted: Orders

## 2023-02-28 NOTE — Telephone Encounter (Signed)
Refilled everything except oxy. Sent norco instead.  He needs to wean down.

## 2023-02-28 NOTE — Telephone Encounter (Signed)
Pt calling in for refill on Ondansetron, Methocarbamol and oxycodone pt is all out please send [facility] on [address]

## 2023-03-01 ENCOUNTER — Other Ambulatory Visit: Payer: Self-pay | Admitting: Family Medicine

## 2023-03-19 ENCOUNTER — Other Ambulatory Visit: Payer: Self-pay | Admitting: Orthopaedic Surgery

## 2023-03-21 ENCOUNTER — Telehealth: Payer: Self-pay | Admitting: Orthopaedic Surgery

## 2023-03-21 ENCOUNTER — Encounter: Payer: Self-pay | Admitting: Family Medicine

## 2023-03-21 ENCOUNTER — Ambulatory Visit (INDEPENDENT_AMBULATORY_CARE_PROVIDER_SITE_OTHER): Payer: 59 | Admitting: Family Medicine

## 2023-03-21 VITALS — BP 118/75 | HR 75 | Temp 98.1°F | Ht 71.0 in | Wt 179.6 lb

## 2023-03-21 DIAGNOSIS — N401 Enlarged prostate with lower urinary tract symptoms: Secondary | ICD-10-CM

## 2023-03-21 DIAGNOSIS — R3911 Hesitancy of micturition: Secondary | ICD-10-CM | POA: Diagnosis not present

## 2023-03-21 DIAGNOSIS — I739 Peripheral vascular disease, unspecified: Secondary | ICD-10-CM

## 2023-03-21 DIAGNOSIS — R972 Elevated prostate specific antigen [PSA]: Secondary | ICD-10-CM | POA: Diagnosis not present

## 2023-03-21 LAB — PSA: PSA: 5.56 ng/mL — ABNORMAL HIGH (ref 0.10–4.00)

## 2023-03-21 LAB — LDL CHOLESTEROL, DIRECT: Direct LDL: 119 mg/dL

## 2023-03-21 MED ORDER — TAMSULOSIN HCL 0.4 MG PO CAPS: 0.4000 mg | ORAL_CAPSULE | Freq: Every day | ORAL | 0 refills | Status: DC

## 2023-03-21 MED ORDER — ONDANSETRON HCL 4 MG PO TABS: 4.0000 mg | ORAL_TABLET | Freq: Three times a day (TID) | ORAL | 0 refills | Status: DC | PRN

## 2023-03-21 MED ORDER — TAMSULOSIN HCL 0.4 MG PO CAPS: 0.4000 mg | ORAL_CAPSULE | Freq: Every day | ORAL | 3 refills | Status: DC

## 2023-03-21 MED ORDER — ATORVASTATIN CALCIUM 10 MG PO TABS: 10.0000 mg | ORAL_TABLET | Freq: Every day | ORAL | 3 refills | Status: DC

## 2023-03-21 NOTE — Progress Notes (Addendum)
Established Patient Office Visit   Subjective:  Patient ID: Randy Park, male    DOB: 1960-04-13  Age: 62 y.o. MRN: [id number]  No chief complaint on file.   HPI Encounter Diagnoses  Name Primary?   Benign prostatic hyperplasia with urinary hesitancy Yes   PVD (peripheral vascular disease) (HCC)    Elevated PSA, less than 10 ng/ml    For follow-up of above.  Tamsulosin continues to help urine flow.  His stream is stronger remove it.  Continues atorvastatin for history of manic cholesterol with peripheral vascular disease involving his coronary arteries.  He quit smoking.   Review of Systems  Constitutional: Negative.   HENT: Negative.    Eyes:  Negative for blurred vision, discharge and redness.  Respiratory: Negative.  Negative for shortness of breath.   Cardiovascular: Negative.  Negative for chest pain.  Gastrointestinal:  Negative for abdominal pain.  Genitourinary: Negative.  Negative for dysuria, frequency and urgency.  Musculoskeletal: Negative.  Negative for myalgias.  Skin:  Negative for rash.  Neurological:  Negative for tingling, loss of consciousness and weakness.  Endo/Heme/Allergies:  Negative for polydipsia.     Current Outpatient Medications:    atorvastatin (LIPITOR) 20 MG tablet, Take 1 tablet (20 mg total) by mouth daily., Disp: 90 tablet, Rfl: 3   aspirin EC 81 MG tablet, Take 1 tablet (81 mg total) by mouth 2 (two) times daily. To be taken after surgery to prevent blood clots (Patient not taking: Reported on 03/21/2023), Disp: 84 tablet, Rfl: 0   docusate sodium (COLACE) 100 MG capsule, Take 1 capsule (100 mg total) by mouth daily as needed. (Patient not taking: Reported on 03/21/2023), Disp: 30 capsule, Rfl: 2   HYDROcodone-acetaminophen (NORCO) 5-325 MG tablet, Take 1-2 tablets by mouth daily as needed. (Patient not taking: Reported on 03/21/2023), Disp: 10 tablet, Rfl: 0   HYDROmorphone (DILAUDID) 2 MG tablet, Take 1 tablet (2 mg total) by  mouth 2 (two) times daily as needed for severe pain (pain score 7-10). For breakthrough pain (Patient not taking: Reported on 03/21/2023), Disp: 10 tablet, Rfl: 0   methocarbamol (ROBAXIN-750) 750 MG tablet, Take 1 tablet (750 mg total) by mouth 2 (two) times daily as needed for muscle spasms., Disp: 20 tablet, Rfl: 2   ondansetron (ZOFRAN) 4 MG tablet, Take 1 tablet (4 mg total) by mouth every 8 (eight) hours as needed for nausea or vomiting., Disp: 40 tablet, Rfl: 0   oxyCODONE-acetaminophen (PERCOCET) 5-325 MG tablet, Take 1-2 tablets by mouth every 6 (six) hours as needed. To be taken after surgery (Patient not taking: Reported on 03/21/2023), Disp: 40 tablet, Rfl: 0   tamsulosin (FLOMAX) 0.4 MG CAPS capsule, Take 1 capsule (0.4 mg total) by mouth daily., Disp: 90 capsule, Rfl: 3   Objective:     BP 118/75   Pulse 75   Temp 98.1 F (36.7 C)   Ht 5\' 11"  (1.803 m)   Wt 179 lb 9.6 oz (81.5 kg)   SpO2 97%   BMI 25.05 kg/m    Physical Exam Constitutional:      General: He is not in acute distress.    Appearance: Normal appearance. He is not ill-appearing, toxic-appearing or diaphoretic.  HENT:     Head: Normocephalic and atraumatic.     Right Ear: External ear normal.     Left Ear: External ear normal.     Mouth/Throat:     Mouth: Mucous membranes are moist.     Pharynx: Oropharynx  is clear. No oropharyngeal exudate or posterior oropharyngeal erythema.  Eyes:     General: No scleral icterus.       Right eye: No discharge.        Left eye: No discharge.     Extraocular Movements: Extraocular movements intact.     Conjunctiva/sclera: Conjunctivae normal.     Pupils: Pupils are equal, round, and reactive to light.  Cardiovascular:     Rate and Rhythm: Normal rate and regular rhythm.  Pulmonary:     Effort: Pulmonary effort is normal. No respiratory distress.     Breath sounds: Normal breath sounds.  Abdominal:     General: Bowel sounds are normal.  Musculoskeletal:      Cervical back: No rigidity or tenderness.  Skin:    General: Skin is warm and dry.  Neurological:     Mental Status: He is alert and oriented to person, place, and time.  Psychiatric:        Mood and Affect: Mood normal.        Behavior: Behavior normal.      Results for orders placed or performed in visit on 03/21/23  LDL cholesterol, direct  Result Value Ref Range   Direct LDL 119.0 mg/dL  PSA  Result Value Ref Range   PSA 5.56 (H) 0.10 - 4.00 ng/mL      The 10-year ASCVD risk score (Arnett DK, et al., 2019) is: 7%    Assessment & Plan:   Benign prostatic hyperplasia with urinary hesitancy -     PSA -     Tamsulosin HCl; Take 1 capsule (0.4 mg total) by mouth daily.  Dispense: 90 capsule; Refill: 3  PVD (peripheral vascular disease) (HCC) -     LDL cholesterol, direct -     Atorvastatin Calcium; Take 1 tablet (20 mg total) by mouth daily.  Dispense: 90 tablet; Refill: 3  Elevated PSA, less than 10 ng/ml    Return in about 6 months (around 09/19/2023) for annual physical.  Refilled atorvastatin and tamsulosin.  Information given on BPH as well as the Mediterranean diet.  Overdue for physical.  Mliss Sax, MD  12/17 addendum: Have increased atorvastatin to 20 mg daily.

## 2023-03-21 NOTE — Telephone Encounter (Signed)
Patient called and needs a refill on muscle relaxer and the aspirin Ondansetron 4mg . [phone]

## 2023-03-22 ENCOUNTER — Other Ambulatory Visit (INDEPENDENT_AMBULATORY_CARE_PROVIDER_SITE_OTHER): Payer: 59

## 2023-03-22 ENCOUNTER — Other Ambulatory Visit: Payer: Self-pay | Admitting: Physician Assistant

## 2023-03-22 ENCOUNTER — Ambulatory Visit (INDEPENDENT_AMBULATORY_CARE_PROVIDER_SITE_OTHER): Payer: 59 | Admitting: Physician Assistant

## 2023-03-22 DIAGNOSIS — Z96641 Presence of right artificial hip joint: Secondary | ICD-10-CM | POA: Diagnosis not present

## 2023-03-22 DIAGNOSIS — I739 Peripheral vascular disease, unspecified: Secondary | ICD-10-CM | POA: Insufficient documentation

## 2023-03-22 DIAGNOSIS — R972 Elevated prostate specific antigen [PSA]: Secondary | ICD-10-CM | POA: Insufficient documentation

## 2023-03-22 MED ORDER — ATORVASTATIN CALCIUM 20 MG PO TABS: 20.0000 mg | ORAL_TABLET | Freq: Every day | ORAL | 3 refills | Status: DC

## 2023-03-22 MED ORDER — METHOCARBAMOL 750 MG PO TABS: 750.0000 mg | ORAL_TABLET | Freq: Two times a day (BID) | ORAL | 2 refills | Status: DC | PRN

## 2023-03-22 MED ORDER — ONDANSETRON HCL 4 MG PO TABS: 4.0000 mg | ORAL_TABLET | Freq: Three times a day (TID) | ORAL | 0 refills | Status: DC | PRN

## 2023-03-22 NOTE — Telephone Encounter (Signed)
I called patient and advised. 

## 2023-03-22 NOTE — Telephone Encounter (Signed)
Refilled muscle relaxer and zofran.  Only needs asa x 6 weeks po

## 2023-03-22 NOTE — Addendum Note (Signed)
Addended by: Andrez Grime on: 03/22/2023 08:54 AM   Modules accepted: Orders
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
## 2023-04-22 ENCOUNTER — Telehealth: Payer: Self-pay | Admitting: Family Medicine

## 2023-04-22 NOTE — Telephone Encounter (Signed)
 ERROR

## 2023-04-27 ENCOUNTER — Other Ambulatory Visit (INDEPENDENT_AMBULATORY_CARE_PROVIDER_SITE_OTHER): Payer: 59

## 2023-04-27 ENCOUNTER — Encounter: Payer: Self-pay | Admitting: Orthopaedic Surgery

## 2023-04-27 ENCOUNTER — Ambulatory Visit: Payer: 59 | Admitting: Orthopaedic Surgery

## 2023-04-27 DIAGNOSIS — M25561 Pain in right knee: Secondary | ICD-10-CM

## 2023-04-27 DIAGNOSIS — Z96641 Presence of right artificial hip joint: Secondary | ICD-10-CM | POA: Diagnosis not present

## 2023-04-27 DIAGNOSIS — G8929 Other chronic pain: Secondary | ICD-10-CM

## 2023-04-27 MED ORDER — PREDNISONE 10 MG (21) PO TBPK: ORAL_TABLET | ORAL | 3 refills | Status: DC
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
## 2023-05-03 ENCOUNTER — Encounter: Payer: 59 | Admitting: Orthopaedic Surgery

## 2023-05-12 ENCOUNTER — Ambulatory Visit: Payer: 59 | Admitting: Orthopaedic Surgery

## 2023-05-17 ENCOUNTER — Ambulatory Visit (INDEPENDENT_AMBULATORY_CARE_PROVIDER_SITE_OTHER): Payer: 59 | Admitting: Orthopaedic Surgery

## 2023-05-17 DIAGNOSIS — Z96641 Presence of right artificial hip joint: Secondary | ICD-10-CM | POA: Diagnosis not present

## 2023-05-17 DIAGNOSIS — Z96642 Presence of left artificial hip joint: Secondary | ICD-10-CM
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
## 2023-05-21 ENCOUNTER — Emergency Department (HOSPITAL_COMMUNITY)
Admission: EM | Admit: 2023-05-21 | Discharge: 2023-05-21 | Disposition: A | Discharge status: Home / Self Care | Payer: 59 | Attending: Emergency Medicine | Admitting: Emergency Medicine

## 2023-05-21 ENCOUNTER — Emergency Department (HOSPITAL_COMMUNITY): Payer: 59

## 2023-05-21 DIAGNOSIS — Z7982 Long term (current) use of aspirin: Secondary | ICD-10-CM | POA: Insufficient documentation

## 2023-05-21 DIAGNOSIS — Z87891 Personal history of nicotine dependence: Secondary | ICD-10-CM | POA: Insufficient documentation

## 2023-05-21 DIAGNOSIS — J069 Acute upper respiratory infection, unspecified: Secondary | ICD-10-CM | POA: Diagnosis not present

## 2023-05-21 DIAGNOSIS — R059 Cough, unspecified: Secondary | ICD-10-CM | POA: Diagnosis present

## 2023-05-21 LAB — RESP PANEL BY RT-PCR (RSV, FLU A&B, COVID)  RVPGX2
Influenza A by PCR: NEGATIVE
Influenza B by PCR: NEGATIVE
Resp Syncytial Virus by PCR: NEGATIVE
SARS Coronavirus 2 by RT PCR: NEGATIVE

## 2023-05-21 MED ORDER — TRIAMCINOLONE ACETONIDE 55 MCG/ACT NA AERO: 2.0000 | INHALATION_SPRAY | Freq: Every day | NASAL | 0 refills | Status: DC

## 2023-05-21 MED ORDER — BENZONATATE 100 MG PO CAPS
100.0000 mg | ORAL_CAPSULE | Freq: Three times a day (TID) | ORAL | 0 refills | Status: DC | PRN
Start: 2023-05-21 — End: 2023-07-28

## 2023-05-21 MED ORDER — ACETAMINOPHEN 325 MG PO TABS
650.0000 mg | ORAL_TABLET | Freq: Once | ORAL | Status: AC
  Administered 2023-05-21: 650 mg via ORAL
  Filled 2023-05-21: qty 2

## 2023-05-21 NOTE — ED Provider Notes (Signed)
[facility] AT [facility] Provider Note   CSN: [id number] Arrival date & time: 05/21/23  1348     History  Chief Complaint  Patient presents with   Cough    Randy Park is a 63 y.o. male.   Cough   63 year old male presents emergency department claims of cough, runny nose, headache, chest pain associated with coughing.  States symptoms began yesterday.  Has been trying at home DayQuil with some improvement of symptoms as well as Robitussin.  Presents emergency department for further assessment.  Denies any fever, chills, shortness of breath, Donnell pain, nausea, vomiting.  Denies any known sick contact.  Past medical history significant for GERD, headache, colitis, BPH  Home Medications Prior to Admission medications   Medication Sig Start Date End Date Taking? Authorizing Provider  benzonatate (TESSALON) 100 MG capsule Take 1 capsule (100 mg total) by mouth 3 (three) times daily as needed for cough. 05/21/23  Yes Sherian Maroon A, PA  triamcinolone (NASACORT) 55 MCG/ACT AERO nasal inhaler Place 2 sprays into the nose daily. 05/21/23  Yes Sherian Maroon A, PA  aspirin EC 81 MG tablet Take 1 tablet (81 mg total) by mouth 2 (two) times daily. To be taken after surgery to prevent blood clots 01/31/23 01/31/24  Cristie Hem, PA-C  atorvastatin (LIPITOR) 20 MG tablet Take 1 tablet (20 mg total) by mouth daily. 03/22/23   Mliss Sax, MD  docusate sodium (COLACE) 100 MG capsule Take 1 capsule (100 mg total) by mouth daily as needed. 01/31/23 01/31/24  Cristie Hem, PA-C  HYDROcodone-acetaminophen (NORCO) 5-325 MG tablet Take 1-2 tablets by mouth daily as needed. 02/28/23   Tarry Kos, MD  HYDROmorphone (DILAUDID) 2 MG tablet Take 1 tablet (2 mg total) by mouth 2 (two) times daily as needed for severe pain (pain score 7-10). For breakthrough pain 02/10/23   Cristie Hem, PA-C  methocarbamol (ROBAXIN-750) 750 MG tablet Take  1 tablet (750 mg total) by mouth 2 (two) times daily as needed for muscle spasms. 03/22/23   Cristie Hem, PA-C  ondansetron (ZOFRAN) 4 MG tablet Take 1 tablet (4 mg total) by mouth every 8 (eight) hours as needed for nausea or vomiting. 03/22/23   Cristie Hem, PA-C  oxyCODONE-acetaminophen (PERCOCET) 5-325 MG tablet Take 1-2 tablets by mouth every 6 (six) hours as needed. To be taken after surgery 02/17/23   Cristie Hem, PA-C  predniSONE (STERAPRED UNI-PAK 21 TAB) 10 MG (21) TBPK tablet Take as directed 04/27/23   Tarry Kos, MD  tamsulosin (FLOMAX) 0.4 MG CAPS capsule Take 1 capsule (0.4 mg total) by mouth daily. 03/21/23   Mliss Sax, MD      Allergies    Amoxicillin    Review of Systems   Review of Systems  Respiratory:  Positive for cough.   All other systems reviewed and are negative.   Physical Exam Updated Vital Signs BP 132/84 (BP Location: Right Arm)   Pulse 78   Temp 98.5 F (36.9 C) (Oral)   Resp 16   Ht 5\' 11"  (1.803 m)   Wt 81.6 kg   SpO2 99%   BMI 25.10 kg/m  Physical Exam Vitals and nursing note reviewed.  Constitutional:      General: He is not in acute distress.    Appearance: He is well-developed.  HENT:     Head: Normocephalic and atraumatic.     Nose: Congestion present.  Mouth/Throat:     Comments: Mild posterior pharyngeal erythema.  Uvula midline right symmetric phonation.  No sublingual extremity pain or swelling.  Tonsils 1+ bilaterally without exudate.  No trismus. Eyes:     Conjunctiva/sclera: Conjunctivae normal.  Cardiovascular:     Rate and Rhythm: Normal rate and regular rhythm.     Heart sounds: No murmur heard. Pulmonary:     Effort: Pulmonary effort is normal. No respiratory distress.     Breath sounds: Normal breath sounds. No wheezing, rhonchi or rales.     Comments: Midsternal tenderness to palpation. Chest:     Chest wall: Tenderness present.  Abdominal:     Palpations: Abdomen is soft.      Tenderness: There is no abdominal tenderness.  Musculoskeletal:        General: No swelling.     Cervical back: Normal range of motion and neck supple. No rigidity or tenderness.  Skin:    General: Skin is warm and dry.     Capillary Refill: Capillary refill takes less than 2 seconds.  Neurological:     Mental Status: He is alert.     Comments: Alert and oriented to self, place, time and event.   Speech is fluent, clear without dysarthria or dysphasia.   Strength 5/5 in upper/lower extremities   Sensation intact in upper/lower extremities   Normal gait.  CN I not tested  CN II not tested CN III, IV, VI PERRLA and EOMs intact bilaterally  CN V Intact sensation to sharp and light touch to the face  CN VII facial movements symmetric  CN VIII not tested  CN IX, X no uvula deviation, symmetric rise of soft palate  CN XI 5/5 SCM and trapezius strength bilaterally  CN XII Midline tongue protrusion, symmetric L/R movements     Psychiatric:        Mood and Affect: Mood normal.     ED Results / Procedures / Treatments   Labs (all labs ordered are listed, but only abnormal results are displayed) Labs Reviewed  RESP PANEL BY RT-PCR (RSV, FLU A&B, COVID)  [id number]    EKG None  Radiology DG Chest 2 View Result Date: 05/21/2023 CLINICAL DATA:  Cough EXAM: CHEST - 2 VIEW COMPARISON:  06/03/2017 FINDINGS: The heart size and mediastinal contours are within normal limits. Both lungs are clear. The visualized skeletal structures are unremarkable. IMPRESSION: No active cardiopulmonary disease. Electronically Signed   By: Duanne Guess D.O.   On: 05/21/2023 14:56    Procedures Procedures    Medications Ordered in ED Medications  acetaminophen (TYLENOL) tablet 650 mg (650 mg Oral Given 05/21/23 1430)    ED Course/ Medical Decision Making/ A&P                                 Medical Decision Making Amount and/or Complexity of Data Reviewed Radiology: ordered.  Risk OTC  drugs. Prescription drug management.   This patient presents to the ED for concern of cough, congestion, this involves an extensive number of treatment options, and is a complaint that carries with it a high risk of complications and morbidity.  The differential diagnosis includes COVID, flu, RSV, pneumonia, other   Co morbidities that complicate the patient evaluation  See HPI   Additional history obtained:  Additional history obtained from EMR External records from outside source obtained and reviewed including hospital records   Lab Tests:  I Ordered,  and personally interpreted labs.  The pertinent results include: Viral testing negative   Imaging Studies ordered:  I ordered imaging studies including chest x-ray I independently visualized and interpreted imaging which showed no acute cardiopulmonary abnormalities. I agree with the radiologist interpretation   Cardiac Monitoring: / EKG:  The patient was maintained on a cardiac monitor.  I personally viewed and interpreted the cardiac monitored which showed an underlying rhythm of: Sinus rhythm   Consultations Obtained:  N/a   Problem List / ED Course / Critical interventions / Medication management  Viral URI with cough I ordered medication including tylenol    Reevaluation of the patient after these medicines showed that the patient improved I have reviewed the patients home medicines and have made adjustments as needed   Social Determinants of Health:  Former cigarette use.  Denies illicit drug use.   Test / Admission - Considered:  Viral URI with cough. Vitals signs within normal range and stable throughout visit. Laboratory/imaging studies significant for: see above 62 year old male presents emergency department complaints of cough, headache, nasal congestion.  Symptom onset yesterday.  On exam, lungs clear to auscultation bilaterally.  Headache frontal in nature without radiation.  Nonfocal neuroexam.   No clinical evidence of meningismus.  Suspect headache likely secondary to viral illness.  Viral testing negative.  Chest x-ray negative for pneumonia or other acute cardiopulmonary abnormalities.  Again, suspect patient symptoms likely secondary to viral URI.  Will treat symptomatically as described in AVS and recommend follow-up with PCP.  Treatment plan discussed at length with patient and he acknowledged understanding was agreeable to said plan.  Patient will well-appearing, afebrile in no acute distress. Worrisome signs and symptoms were discussed with the patient, and the patient acknowledged understanding to return to the ED if noticed. Patient was stable upon discharge.          Final Clinical Impression(s) / ED Diagnoses Final diagnoses:  Viral URI with cough    Rx / DC Orders ED Discharge Orders          Ordered    benzonatate (TESSALON) 100 MG capsule  3 times daily PRN        05/21/23 1606    triamcinolone (NASACORT) 55 MCG/ACT AERO nasal inhaler  Daily        05/21/23 1606              Peter Garter, [place] 05/21/23 1613    Vanetta Mulders, MD 05/21/23 2236

## 2023-05-21 NOTE — Discharge Instructions (Signed)
Discussed, you tested negative for COVID, flu, RSV.  Your chest x-ray did not show signs of pneumonia, collapsed lung or other abnormality..  Suspect that your symptoms are likely secondary to viral upper respiratory illness.  Will send in cough suppressant to use as needed.  You may continue take the Robitussin along with this.  Recommend use of Tylenol/Motrin for any pains, fevers.  Will recommend nasal steroid spray such as Nasacort/Flonase as well as allergy medicine such as Zyrtec/Claritin/Allegra.  Recommend follow-up with your primary care for reassessment.  Please not hesitate to return if the worrisome signs and symptoms we discussed become apparent.

## 2023-05-21 NOTE — ED Triage Notes (Signed)
Patient reports having cough/headache/runny nose since yesterday Says chest hurts when coughing Denies sick contacts.  Denies pain, says head just hurts

## 2023-05-23 ENCOUNTER — Telehealth: Payer: Self-pay

## 2023-05-23 NOTE — Transitions of Care (Post Inpatient/ED Visit) (Signed)
   05/23/2023  Name: Randy Park MRN: [id number] DOB: 1961-02-21  Today's TOC FU Call Status: Today's TOC FU Call Status:: Unsuccessful Call (1st Attempt) Unsuccessful Call (1st Attempt) Date: 05/23/23  Attempted to reach the patient regarding the most recent Inpatient/ED visit.  Follow Up Plan: Additional outreach attempts will be made to reach the patient to complete the Transitions of Care (Post Inpatient/ED visit) call.   Signature Jenny Reichmann

## 2023-05-24 NOTE — Transitions of Care (Post Inpatient/ED Visit) (Signed)
   05/24/2023  Name: Randy Park MRN: [id number] DOB: 18-Feb-1961  Today's TOC FU Call Status: Today's TOC FU Call Status:: Unsuccessful Call (2nd Attempt) Unsuccessful Call (1st Attempt) Date: 05/23/23 Unsuccessful Call (2nd Attempt) Date: 05/24/23  Attempted to reach the patient regarding the most recent Inpatient/ED visit.  Follow Up Plan: Additional outreach attempts will be made to reach the patient to complete the Transitions of Care (Post Inpatient/ED visit) call.   Signature Jenny Reichmann

## 2023-07-11 ENCOUNTER — Telehealth: Payer: Self-pay | Admitting: Orthopaedic Surgery

## 2023-07-11 ENCOUNTER — Other Ambulatory Visit: Payer: Self-pay

## 2023-07-11 MED ORDER — DOXYCYCLINE HYCLATE 100 MG PO CAPS: ORAL_CAPSULE | ORAL | 2 refills | Status: DC

## 2023-07-11 NOTE — Telephone Encounter (Signed)
 Yes please send 100 mg doxy 30 mins prior to appt.  Thank you.

## 2023-07-11 NOTE — Telephone Encounter (Signed)
Allergic to Amoxicillin

## 2023-07-11 NOTE — Telephone Encounter (Signed)
 Pt wants to know if pre meds are needed for a tooth extraction

## 2023-07-26 ENCOUNTER — Encounter: Payer: Self-pay | Admitting: Orthopaedic Surgery

## 2023-07-26 ENCOUNTER — Other Ambulatory Visit (INDEPENDENT_AMBULATORY_CARE_PROVIDER_SITE_OTHER)

## 2023-07-26 ENCOUNTER — Ambulatory Visit (INDEPENDENT_AMBULATORY_CARE_PROVIDER_SITE_OTHER): Payer: 59 | Admitting: Orthopaedic Surgery

## 2023-07-26 DIAGNOSIS — Z96641 Presence of right artificial hip joint: Secondary | ICD-10-CM
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
## 2023-07-28 ENCOUNTER — Ambulatory Visit: Admitting: Family Medicine

## 2023-07-28 ENCOUNTER — Encounter: Payer: Self-pay | Admitting: Family Medicine

## 2023-07-28 VITALS — BP 118/68 | HR 57 | Temp 97.6°F | Ht 71.0 in | Wt 183.4 lb

## 2023-07-28 DIAGNOSIS — F341 Dysthymic disorder: Secondary | ICD-10-CM | POA: Diagnosis not present

## 2023-07-28 DIAGNOSIS — I739 Peripheral vascular disease, unspecified: Secondary | ICD-10-CM

## 2023-07-28 DIAGNOSIS — Z0000 Encounter for general adult medical examination without abnormal findings: Secondary | ICD-10-CM | POA: Diagnosis not present

## 2023-07-28 DIAGNOSIS — Z131 Encounter for screening for diabetes mellitus: Secondary | ICD-10-CM

## 2023-07-28 DIAGNOSIS — Z720 Tobacco use: Secondary | ICD-10-CM

## 2023-07-28 DIAGNOSIS — R972 Elevated prostate specific antigen [PSA]: Secondary | ICD-10-CM | POA: Diagnosis not present

## 2023-07-28 DIAGNOSIS — Z125 Encounter for screening for malignant neoplasm of prostate: Secondary | ICD-10-CM

## 2023-07-28 LAB — LIPID PANEL
Cholesterol: 114 mg/dL (ref 0–200)
HDL: 48.6 mg/dL (ref >39.00)
LDL Cholesterol: 52 mg/dL (ref 0–99)
NonHDL: 65.6
Total CHOL/HDL Ratio: 2
Triglycerides: 66 mg/dL (ref 0.0–149.0)
VLDL: 13.2 mg/dL (ref 0.0–40.0)

## 2023-07-28 LAB — CBC WITH DIFFERENTIAL/PLATELET
Basophils Absolute: 0 10*3/uL (ref 0.0–0.1)
Basophils Relative: 0.7 % (ref 0.0–3.0)
Eosinophils Absolute: 0.1 10*3/uL (ref 0.0–0.7)
Eosinophils Relative: 2.4 % (ref 0.0–5.0)
HCT: 44.9 % (ref 39.0–52.0)
Hemoglobin: 14.9 g/dL (ref 13.0–17.0)
Lymphocytes Relative: 44.3 % (ref 12.0–46.0)
Lymphs Abs: 2.2 10*3/uL (ref 0.7–4.0)
MCHC: 33.2 g/dL (ref 30.0–36.0)
MCV: 96.6 fl (ref 78.0–100.0)
Monocytes Absolute: 0.8 10*3/uL (ref 0.1–1.0)
Monocytes Relative: 15.9 % — ABNORMAL HIGH (ref 3.0–12.0)
Neutro Abs: 1.9 10*3/uL (ref 1.4–7.7)
Neutrophils Relative %: 36.7 % — ABNORMAL LOW (ref 43.0–77.0)
Platelets: 297 10*3/uL (ref 150.0–400.0)
RBC: 4.65 Mil/uL (ref 4.22–5.81)
RDW: 13.9 % (ref 11.5–15.5)
WBC: 5.1 10*3/uL (ref 4.0–10.5)

## 2023-07-28 LAB — COMPREHENSIVE METABOLIC PANEL WITH GFR
ALT: 13 U/L (ref 0–53)
AST: 14 U/L (ref 0–37)
Albumin: 4.5 g/dL (ref 3.5–5.2)
Alkaline Phosphatase: 81 U/L (ref 39–117)
BUN: 14 mg/dL (ref 6–23)
CO2: 28 meq/L (ref 19–32)
Calcium: 9.4 mg/dL (ref 8.4–10.5)
Chloride: 104 meq/L (ref 96–112)
Creatinine, Ser: 1.19 mg/dL (ref 0.40–1.50)
GFR: 66.28 mL/min (ref >60.00)
Glucose, Bld: 88 mg/dL (ref 70–99)
Potassium: 4.6 meq/L (ref 3.5–5.1)
Sodium: 137 meq/L (ref 135–145)
Total Bilirubin: 1.2 mg/dL (ref 0.2–1.2)
Total Protein: 7.2 g/dL (ref 6.0–8.3)

## 2023-07-28 LAB — TSH: TSH: 1.87 u[IU]/mL (ref 0.35–5.50)

## 2023-07-28 LAB — URINALYSIS, ROUTINE W REFLEX MICROSCOPIC
Bilirubin Urine: NEGATIVE
Hgb urine dipstick: NEGATIVE
Ketones, ur: NEGATIVE
Leukocytes,Ua: NEGATIVE
Nitrite: NEGATIVE
RBC / HPF: NONE SEEN (ref 0–?)
Specific Gravity, Urine: 1.025 (ref 1.000–1.030)
Total Protein, Urine: NEGATIVE
Urine Glucose: NEGATIVE
Urobilinogen, UA: 1 (ref 0.0–1.0)
WBC, UA: NONE SEEN (ref 0–?)
pH: 6 (ref 5.0–8.0)

## 2023-07-28 LAB — PSA: PSA: 5.46 ng/mL — ABNORMAL HIGH (ref 0.10–4.00)

## 2023-07-28 LAB — HEMOGLOBIN A1C: Hgb A1c MFr Bld: 5.4 % (ref 4.6–6.5)

## 2023-07-28 NOTE — Progress Notes (Signed)
 Established Patient Office Visit   Subjective:  Patient ID: Randy Park, male    DOB: [birth year]  Age: 63 y.o. MRN: [id number]  Chief Complaint  Patient presents with   Annual Exam    CPE. Pt is fasting.     HPI Encounter Diagnoses  Name Primary?   Healthcare maintenance Yes   Screening for prostate cancer    Screening for diabetes mellitus    Elevated PSA, less than 10 ng/ml    Tobacco use    Dysthymia    PVD (peripheral vascular disease) (HCC)    Here for physical and he is fasting.  This is a tough time for him.  Continues to deal with pain after his right hip replacement.  He has been unable to work his stress has led him back to smoking.  There has been sadness and anxiety.  Continues atorvastatin  for elevated cholesterol with history of coronary artery calcification.  His balance has been off.  He uses a cane and a walker to prevent falls.  He has been to physical therapy for this.  Was unable to obtain disability through his orthopedic surgeon.   Review of Systems  Constitutional: Negative.   HENT: Negative.    Eyes:  Negative for blurred vision, discharge and redness.  Respiratory: Negative.    Cardiovascular: Negative.   Gastrointestinal:  Negative for abdominal pain.  Genitourinary: Negative.   Musculoskeletal:  Positive for joint pain. Negative for myalgias.  Skin:  Negative for rash.  Neurological:  Negative for tingling, loss of consciousness and weakness.  Endo/Heme/Allergies:  Negative for polydipsia.     Current Outpatient Medications:    aspirin  EC 81 MG tablet, Take 1 tablet (81 mg total) by mouth 2 (two) times daily. To be taken after surgery to prevent blood clots, Disp: 84 tablet, Rfl: 0   atorvastatin  (LIPITOR) 20 MG tablet, Take 1 tablet (20 mg total) by mouth daily., Disp: 90 tablet, Rfl: 3   docusate sodium  (COLACE) 100 MG capsule, Take 1 capsule (100 mg total) by mouth daily as needed., Disp: 30 capsule, Rfl: 2   HYDROmorphone   (DILAUDID ) 2 MG tablet, Take 1 tablet (2 mg total) by mouth 2 (two) times daily as needed for severe pain (pain score 7-10). For breakthrough pain (Patient not taking: Reported on 07/28/2023), Disp: 10 tablet, Rfl: 0   ondansetron  (ZOFRAN ) 4 MG tablet, Take 1 tablet (4 mg total) by mouth every 8 (eight) hours as needed for nausea or vomiting. (Patient not taking: Reported on 07/28/2023), Disp: 40 tablet, Rfl: 0   Objective:     BP 118/68 (Cuff Size: Normal)   Pulse (!) 57   Temp 97.6 F (36.4 C) (Temporal)   Ht 5\' 11"  (1.803 m)   Wt 183 lb 6.4 oz (83.2 kg)   SpO2 98%   BMI 25.58 kg/m  Wt Readings from Last 3 Encounters:  07/28/23 183 lb 6.4 oz (83.2 kg)  05/21/23 180 lb (81.6 kg)  03/21/23 179 lb 9.6 oz (81.5 kg)      Physical Exam Constitutional:      General: He is not in acute distress.    Appearance: Normal appearance. He is not ill-appearing, toxic-appearing or diaphoretic.  HENT:     Head: Normocephalic and atraumatic.     Right Ear: Tympanic membrane, ear canal and external ear normal.     Left Ear: Tympanic membrane, ear canal and external ear normal.     Mouth/Throat:     Mouth: Mucous membranes  are moist.     Pharynx: Oropharynx is clear. No oropharyngeal exudate or posterior oropharyngeal erythema.  Eyes:     General: No scleral icterus.       Right eye: No discharge.        Left eye: No discharge.     Extraocular Movements: Extraocular movements intact.     Conjunctiva/sclera: Conjunctivae normal.     Pupils: Pupils are equal, round, and reactive to light.  Cardiovascular:     Rate and Rhythm: Normal rate and regular rhythm.  Pulmonary:     Effort: Pulmonary effort is normal. No respiratory distress.     Breath sounds: Normal breath sounds.  Abdominal:     General: Bowel sounds are normal.     Tenderness: There is no abdominal tenderness. There is no guarding.     Hernia: There is no hernia in the left inguinal area or right inguinal area.  Genitourinary:     Penis: Circumcised. No hypospadias, erythema, tenderness, discharge, swelling or lesions.      Testes:        Right: Mass, tenderness or swelling not present. Right testis is descended.        Left: Mass, tenderness or swelling not present. Left testis is descended.     Epididymis:     Right: Not inflamed or enlarged.     Left: Not inflamed or enlarged.  Musculoskeletal:     Cervical back: No rigidity or tenderness.  Lymphadenopathy:     Lower Body: No right inguinal adenopathy. No left inguinal adenopathy.  Skin:    General: Skin is warm and dry.  Neurological:     Mental Status: He is alert and oriented to person, place, and time.  Psychiatric:        Mood and Affect: Mood normal.        Behavior: Behavior normal.      Results for orders placed or performed in visit on 07/28/23  CBC with Differential/Platelet  Result Value Ref Range   WBC 5.1 4.0 - 10.5 K/uL   RBC 4.65 4.22 - 5.81 Mil/uL   Hemoglobin 14.9 13.0 - 17.0 g/dL   HCT 16.1 09.6 - 04.5 %   MCV 96.6 78.0 - 100.0 fl   MCHC 33.2 30.0 - 36.0 g/dL   RDW 40.9 81.1 - 91.4 %   Platelets 297.0 150.0 - 400.0 K/uL   Neutrophils Relative % 36.7 (L) 43.0 - 77.0 %   Lymphocytes Relative 44.3 12.0 - 46.0 %   Monocytes Relative 15.9 (H) 3.0 - 12.0 %   Eosinophils Relative 2.4 0.0 - 5.0 %   Basophils Relative 0.7 0.0 - 3.0 %   Neutro Abs 1.9 1.4 - 7.7 K/uL   Lymphs Abs 2.2 0.7 - 4.0 K/uL   Monocytes Absolute 0.8 0.1 - 1.0 K/uL   Eosinophils Absolute 0.1 0.0 - 0.7 K/uL   Basophils Absolute 0.0 0.0 - 0.1 K/uL  Comprehensive metabolic panel with GFR  Result Value Ref Range   Sodium 137 135 - 145 mEq/L   Potassium 4.6 3.5 - 5.1 mEq/L   Chloride 104 96 - 112 mEq/L   CO2 28 19 - 32 mEq/L   Glucose, Bld 88 70 - 99 mg/dL   BUN 14 6 - 23 mg/dL   Creatinine, Ser 7.82 0.40 - 1.50 mg/dL   Total Bilirubin 1.2 0.2 - 1.2 mg/dL   Alkaline Phosphatase 81 39 - 117 U/L   AST 14 0 - 37 U/L   ALT 13 0 -  53 U/L   Total Protein 7.2 6.0 -  8.3 g/dL   Albumin  4.5 3.5 - 5.2 g/dL   GFR 95.28 >41.32 mL/min   Calcium  9.4 8.4 - 10.5 mg/dL  Lipid panel  Result Value Ref Range   Cholesterol 114 0 - 200 mg/dL   Triglycerides 44.0 0.0 - 149.0 mg/dL   HDL 10.27 >25.36 mg/dL   VLDL 64.4 0.0 - 03.4 mg/dL   LDL Cholesterol 52 0 - 99 mg/dL   Total CHOL/HDL Ratio 2    NonHDL 65.60   TSH  Result Value Ref Range   TSH 1.87 0.35 - 5.50 uIU/mL  Urinalysis, Routine w reflex microscopic  Result Value Ref Range   Color, Urine YELLOW Yellow;Lt. Yellow;Straw;Dark Yellow;Amber;Green;Red;Brown   APPearance CLEAR Clear;Turbid;Slightly Cloudy;Cloudy   Specific Gravity, Urine 1.025 1.000 - 1.030   pH 6.0 5.0 - 8.0   Total Protein, Urine NEGATIVE Negative   Urine Glucose NEGATIVE Negative   Ketones, ur NEGATIVE Negative   Bilirubin Urine NEGATIVE Negative   Hgb urine dipstick NEGATIVE Negative   Urobilinogen, UA 1.0 0.0 - 1.0   Leukocytes,Ua NEGATIVE Negative   Nitrite NEGATIVE Negative   WBC, UA none seen 0-2/hpf   RBC / HPF none seen 0-2/hpf  PSA  Result Value Ref Range   PSA 5.46 (H) 0.10 - 4.00 ng/mL  Hemoglobin A1c  Result Value Ref Range   Hgb A1c MFr Bld 5.4 4.6 - 6.5 %      The ASCVD Risk score (Arnett DK, et al., 2019) failed to calculate for the following reasons:   The valid total cholesterol range is 130 to 320 mg/dL    Assessment & Plan:   Healthcare maintenance -     CBC with Differential/Platelet -     Urinalysis, Routine w reflex microscopic  Screening for prostate cancer  Screening for diabetes mellitus -     Comprehensive metabolic panel with GFR -     Hemoglobin A1c  Elevated PSA, less than 10 ng/ml -     PSA -     Ambulatory referral to Urology  Tobacco use  Dysthymia -     TSH  PVD (peripheral vascular disease) (HCC) -     Lipid panel    Return in about 6 months (around 01/27/2024).  Information given about health maintenance and disease prevention.  Urged him to stop smoking.  Information  was given about Cymbalta that could possibly help with pain anxiety and sadness.  Information given about depression.  Tonna Frederic, MD

## 2023-07-29 NOTE — Addendum Note (Signed)
 Addended by: Delene Feinstein on: 07/29/2023 08:05 AM   Modules accepted: Orders

## 2023-08-05 NOTE — Telephone Encounter (Signed)
 Copied from CRM [id number]. Topic: General - Other >> Aug 05, 2023  8:22 AM Randy Park wrote: Reason for CRM: Patient called in stating that his medical card has expired,he has recently been in for his physical.He would ike to know how do he go about getting it updated or renewed?

## 2023-09-19 ENCOUNTER — Encounter: Payer: 59 | Admitting: Family Medicine

## 2023-09-27 ENCOUNTER — Telehealth: Payer: Self-pay | Admitting: Family Medicine

## 2023-09-27 NOTE — Telephone Encounter (Signed)
 Patient dropped off document yearly physical, to be filled out by provider. Patient requested to send it back via Fax within 7-days. Document is located in providers tray at front office.Please advise at Mobile [phone] (mobile) Patient walked in with forms to fill out. Pt stated you can fax this form back and please notify him. I put in the dr box

## 2023-10-03 NOTE — Telephone Encounter (Unsigned)
 Copied from CRM [id number]. Topic: General - Other >> Oct 03, 2023  2:10 PM Mercedes MATSU wrote: Reason for CRM: Patient states that he dropped off some paper work for Dr. Berneta to fill out and wanted a status update on the paper work. Patient is requesting a call back and can be reached at [phone].

## 2023-10-25 ENCOUNTER — Ambulatory Visit: Admitting: Orthopaedic Surgery

## 2023-12-16 ENCOUNTER — Other Ambulatory Visit (HOSPITAL_COMMUNITY): Payer: Self-pay | Admitting: Urology

## 2023-12-16 DIAGNOSIS — C61 Malignant neoplasm of prostate: Secondary | ICD-10-CM

## 2023-12-21 NOTE — Progress Notes (Signed)
 GU Location of Tumor / Histology: Prostate Ca  If Prostate Cancer, Gleason Score is (4 + 4) and PSA is (5.46 on 07/2023)  Randy Park presented as referral from Dr. Donnice Siad [facility]) elevated PSA.  Biopsies       12/26/2023 Dr. Donnice Siad NM PET (PSMA) Skull to Mid Thigh CLINICAL DATA:  Prostate carcinoma with biochemical recurrence.   IMPRESSION: 1. Intense radiotracer activity within the posterior LEFT lobe of the prostate gland consistent with primary prostate carcinoma. 2. Significant streak artifact through the pelvis on CT portion exam. 3. No evidence of metastatic adenopathy in the pelvis or periaortic retroperitoneum. 4. No distant metastatic disease or visceral metastasis. 5. Indeterminate lesion in the RIGHT hepatic lobe. No PSMA activity however, recommend liver MRI with without contrast for evaluation non prostate cancer lesion.    Past/Anticipated interventions by urology, if any:   Dr. Donnice Siad   Past/Anticipated interventions by medical oncology, if any: NA  Weight changes, if any: {:18581}  IPSS: SHIM:  Bowel/Bladder complaints, if any: {:18581}   Nausea/Vomiting, if any: {:18581}  Pain issues, if any:  {:18581}  SAFETY ISSUES: Prior radiation? {:18581} Pacemaker/ICD? {:18581} Possible current pregnancy?  Male Is the patient on methotrexate? No  Current Complaints / other details:

## 2023-12-26 ENCOUNTER — Encounter (HOSPITAL_COMMUNITY)
Admission: RE | Admit: 2023-12-26 | Discharge: 2023-12-26 | Disposition: A | Discharge status: Home / Self Care | Source: Ambulatory Visit | Attending: Urology | Admitting: Urology

## 2023-12-26 DIAGNOSIS — C61 Malignant neoplasm of prostate: Secondary | ICD-10-CM | POA: Insufficient documentation

## 2023-12-26 MED ORDER — FLOTUFOLASTAT F 18 GALLIUM 296-5846 MBQ/ML IV SOLN
8.4000 | Freq: Once | INTRAVENOUS | Status: AC
  Administered 2023-12-26: 8.4 via INTRAVENOUS

## 2023-12-27 NOTE — Progress Notes (Signed)
 STAT read requested for recent PSMA PET prior to consult on 9/24.

## 2023-12-28 ENCOUNTER — Encounter: Payer: Self-pay | Admitting: Radiation Oncology

## 2023-12-28 ENCOUNTER — Ambulatory Visit
Admission: RE | Admit: 2023-12-28 | Discharge: 2023-12-28 | Disposition: A | Discharge status: Home / Self Care | Source: Ambulatory Visit | Attending: Radiation Oncology | Admitting: Radiation Oncology

## 2023-12-28 ENCOUNTER — Other Ambulatory Visit: Payer: Self-pay | Admitting: Urology

## 2023-12-28 ENCOUNTER — Telehealth: Payer: Self-pay

## 2023-12-28 VITALS — BP 126/73 | HR 58 | Temp 97.9°F | Resp 18 | Ht 71.0 in | Wt 171.4 lb

## 2023-12-28 DIAGNOSIS — R932 Abnormal findings on diagnostic imaging of liver and biliary tract: Secondary | ICD-10-CM

## 2023-12-28 DIAGNOSIS — Z87891 Personal history of nicotine dependence: Secondary | ICD-10-CM | POA: Insufficient documentation

## 2023-12-28 DIAGNOSIS — C61 Malignant neoplasm of prostate: Secondary | ICD-10-CM | POA: Insufficient documentation

## 2023-12-28 DIAGNOSIS — M129 Arthropathy, unspecified: Secondary | ICD-10-CM | POA: Diagnosis not present

## 2023-12-28 DIAGNOSIS — Z79899 Other long term (current) drug therapy: Secondary | ICD-10-CM | POA: Diagnosis not present

## 2023-12-28 DIAGNOSIS — Z860101 Personal history of adenomatous and serrated colon polyps: Secondary | ICD-10-CM | POA: Insufficient documentation

## 2023-12-28 DIAGNOSIS — K219 Gastro-esophageal reflux disease without esophagitis: Secondary | ICD-10-CM | POA: Insufficient documentation

## 2023-12-28 DIAGNOSIS — K449 Diaphragmatic hernia without obstruction or gangrene: Secondary | ICD-10-CM | POA: Diagnosis not present

## 2023-12-28 HISTORY — DX: Elevated prostate specific antigen (PSA): R97.20

## 2023-12-28 NOTE — Telephone Encounter (Signed)
 [facility] Clinical Social Work  Clinical Social Work was referred by nurse for [employer] needs and assessment of psychosocial needs.  Clinical Social Worker contacted patient by phone to offer support and assess for needs.  CSW spoke with patient briefly. CSW explained role in his support team and purpose of call. Patient's treatment plan is undecided at this point, patient hopes to have final decision in coming weeks. Patient preferred to defer assessment until decision is made. CSW will continue to monitor chart.    Lizbeth Sprague, LCSW  Clinical Social Worker [facility]
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
## 2023-12-29 ENCOUNTER — Other Ambulatory Visit (INDEPENDENT_AMBULATORY_CARE_PROVIDER_SITE_OTHER): Payer: Self-pay

## 2023-12-29 ENCOUNTER — Ambulatory Visit (INDEPENDENT_AMBULATORY_CARE_PROVIDER_SITE_OTHER): Admitting: Orthopaedic Surgery

## 2023-12-29 DIAGNOSIS — Z96642 Presence of left artificial hip joint: Secondary | ICD-10-CM | POA: Diagnosis not present

## 2023-12-29 DIAGNOSIS — Z96641 Presence of right artificial hip joint: Secondary | ICD-10-CM | POA: Diagnosis not present
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
## 2023-12-30 ENCOUNTER — Other Ambulatory Visit: Payer: Self-pay | Admitting: Family Medicine

## 2023-12-30 DIAGNOSIS — R160 Hepatomegaly, not elsewhere classified: Secondary | ICD-10-CM

## 2024-01-04 NOTE — Telephone Encounter (Unsigned)
 Copied from CRM [id number]. Topic: General - Other >> Jan 04, 2024  9:09 AM Berneda FALCON wrote: Reason for CRM: Manuelita from [facility] imaging states they need a revised order for the CT Abdomen for pelvis with contrast and she states it needs to be CT with and without for liver protocol just of the abdomen, no pelvis.  [phone] option 1 then option 4 for CT dept.

## 2024-01-05 NOTE — Telephone Encounter (Signed)
 Dr. Donnice Kuba has ordered an MRI of his abdomen. A CT scan I ordered can be canceled. He should definitely go for the MRI of his abdomen.

## 2024-01-06 NOTE — Telephone Encounter (Signed)
 Copied from CRM #[id number]. Topic: Clinical - Request for Lab/Test Order >> Jan 06, 2024 10:28 AM Dedra NOVAK wrote: Reason for CRM: Tangie from [facility] called to get an order changed for pt. Caller hung up while on hold for transfer.

## 2024-01-10 NOTE — Telephone Encounter (Signed)
 An MRI of the patient's abdomen has been ordered for November 11 by his oncologist.  I believe that would supersede the need for a CT of his abdomen.

## 2024-01-17 ENCOUNTER — Ambulatory Visit: Admitting: Orthopaedic Surgery

## 2024-01-25 ENCOUNTER — Ambulatory Visit: Admitting: Physician Assistant

## 2024-01-27 ENCOUNTER — Encounter: Payer: Self-pay | Admitting: Family Medicine

## 2024-01-27 ENCOUNTER — Ambulatory Visit: Admitting: Family Medicine

## 2024-01-27 VITALS — BP 118/68 | HR 58 | Temp 97.5°F | Ht 71.0 in | Wt 167.4 lb

## 2024-01-27 DIAGNOSIS — Z1211 Encounter for screening for malignant neoplasm of colon: Secondary | ICD-10-CM

## 2024-01-27 DIAGNOSIS — C61 Malignant neoplasm of prostate: Secondary | ICD-10-CM | POA: Diagnosis not present

## 2024-01-27 DIAGNOSIS — I739 Peripheral vascular disease, unspecified: Secondary | ICD-10-CM | POA: Diagnosis not present

## 2024-01-27 DIAGNOSIS — Z720 Tobacco use: Secondary | ICD-10-CM

## 2024-01-27 DIAGNOSIS — R160 Hepatomegaly, not elsewhere classified: Secondary | ICD-10-CM

## 2024-01-27 DIAGNOSIS — R972 Elevated prostate specific antigen [PSA]: Secondary | ICD-10-CM

## 2024-01-27 MED ORDER — ATORVASTATIN CALCIUM 20 MG PO TABS: 20.0000 mg | ORAL_TABLET | Freq: Every day | ORAL | 3 refills | Status: DC

## 2024-01-27 NOTE — Progress Notes (Signed)
 Established Patient Office Visit   Subjective:  Patient ID: Randy Park, male    DOB: May 08, 1960  Age: 63 y.o. MRN: [id number]  Chief Complaint  Patient presents with   Medical Management of Chronic Issues    6 month follow up. Pt is not fasting. Pt refused Tdap, flu and prevnar. Will consider colonoscopy for future. PT is requesting to have PSA levels checked today.     HPI Encounter Diagnoses  Name Primary?   Elevated PSA, less than 10 ng/ml Yes   PVD (peripheral vascular disease)    Liver mass, right lobe    Malignant neoplasm of prostate (HCC)    Tobacco use    Screening for colon cancer    For follow-up of above.  Request that his PSA level be repeated.  Status post biopsies of the left prostate lobe revealed adenocarcinoma some of which showed a Gleason score of 7.  He has been taking a teaspoon of baking soda twice daily and seeing a great improvement in his urine flow.  MRI of abdomen and pelvis is scheduled for early next month.  He is being followed by GI as well.  He restarted tobacco.  Chantix  is not helpful patches left him with a rash.  He has been able to quit multiple times on his own.  He is working hard to maintain a positive attitude.  Continues atorvastatin  controlled LDL cholesterol.  Colonoscopy.   Review of Systems  Constitutional: Negative.   HENT: Negative.    Eyes:  Negative for blurred vision, discharge and redness.  Respiratory: Negative.    Cardiovascular: Negative.   Gastrointestinal:  Negative for abdominal pain.  Genitourinary: Negative.   Musculoskeletal: Negative.  Negative for myalgias.  Skin:  Negative for rash.  Neurological:  Negative for tingling, loss of consciousness and weakness.  Endo/Heme/Allergies:  Negative for polydipsia.     Current Outpatient Medications:    tamsulosin  (FLOMAX ) 0.4 MG CAPS capsule, Take 0.4 mg by mouth daily., Disp: , Rfl:    atorvastatin  (LIPITOR) 20 MG tablet, Take 1 tablet (20 mg total) by mouth  daily., Disp: 90 tablet, Rfl: 3   Objective:     BP 118/68 (BP Location: Left Arm, Patient Position: Sitting, Cuff Size: Normal)   Pulse (!) 58   Temp (!) 97.5 F (36.4 C) (Temporal)   Ht 5' 11 (1.803 m)   Wt 167 lb 6.4 oz (75.9 kg)   SpO2 100%   BMI 23.35 kg/m    Physical Exam Constitutional:      General: He is not in acute distress.    Appearance: Normal appearance. He is not ill-appearing, toxic-appearing or diaphoretic.  HENT:     Head: Normocephalic and atraumatic.     Right Ear: External ear normal.     Left Ear: External ear normal.  Eyes:     General: No scleral icterus.       Right eye: No discharge.        Left eye: No discharge.     Extraocular Movements: Extraocular movements intact.     Conjunctiva/sclera: Conjunctivae normal.  Cardiovascular:     Rate and Rhythm: Normal rate and regular rhythm.  Pulmonary:     Effort: Pulmonary effort is normal. No respiratory distress.     Breath sounds: No wheezing or rales.  Abdominal:     General: Bowel sounds are normal.  Skin:    General: Skin is warm and dry.  Neurological:     Mental Status: He  is alert and oriented to person, place, and time.  Psychiatric:        Mood and Affect: Mood normal.        Behavior: Behavior normal.      No results found for any visits on 01/27/24.    The ASCVD Risk score (Arnett DK, et al., 2019) failed to calculate for the following reasons:   The valid total cholesterol range is 130 to 320 mg/dL    Assessment & Plan:   Elevated PSA, less than 10 ng/ml -     PSA  PVD (peripheral vascular disease) -     Atorvastatin  Calcium ; Take 1 tablet (20 mg total) by mouth daily.  Dispense: 90 tablet; Refill: 3  Liver mass, right lobe -     CBC with Differential/Platelet -     Comprehensive metabolic panel with GFR -     AFP tumor marker  Malignant neoplasm of prostate (HCC) -     PSA -     CBC with Differential/Platelet  Tobacco use  Screening for colon cancer -      Ambulatory referral to Gastroenterology    Return in about 6 months (around 07/27/2024), or if symptoms worsen or fail to improve.  Encouraged him to continue his positive attitude.  He has been able to quit smoking many times on his own so again.  Currently followed by oncology.  He will be seeing urology soon.   Elsie Sim Lent, MD

## 2024-02-02 NOTE — Progress Notes (Signed)
 RN spoke with patient to follow up after his recent visit with Dr. Selma on 01/26/2024.  Patient notified that appointment was cancelled and he will proceed with MRI of Abd on 11/11 and then have follow up with Dr. Selma.   RN will continue to follow.

## 2024-02-06 ENCOUNTER — Encounter: Payer: Self-pay | Admitting: Radiology

## 2024-02-14 ENCOUNTER — Ambulatory Visit
Admission: RE | Admit: 2024-02-14 | Discharge: 2024-02-14 | Disposition: A | Source: Ambulatory Visit | Attending: Urology

## 2024-02-14 DIAGNOSIS — R932 Abnormal findings on diagnostic imaging of liver and biliary tract: Secondary | ICD-10-CM

## 2024-02-14 MED ORDER — GADOPICLENOL 0.5 MMOL/ML IV SOLN
7.5000 mL | Freq: Once | INTRAVENOUS | Status: AC | PRN
  Administered 2024-02-14: 7.5 mL via INTRAVENOUS

## 2024-02-28 ENCOUNTER — Other Ambulatory Visit (INDEPENDENT_AMBULATORY_CARE_PROVIDER_SITE_OTHER)

## 2024-02-28 DIAGNOSIS — R972 Elevated prostate specific antigen [PSA]: Secondary | ICD-10-CM

## 2024-02-28 DIAGNOSIS — R160 Hepatomegaly, not elsewhere classified: Secondary | ICD-10-CM

## 2024-02-28 DIAGNOSIS — C61 Malignant neoplasm of prostate: Secondary | ICD-10-CM | POA: Diagnosis not present

## 2024-02-28 LAB — CBC WITH DIFFERENTIAL/PLATELET
Basophils Absolute: 0 K/uL (ref 0.0–0.1)
Basophils Relative: 0.7 % (ref 0.0–3.0)
Eosinophils Absolute: 0.1 K/uL (ref 0.0–0.7)
Eosinophils Relative: 2.6 % (ref 0.0–5.0)
HCT: 40.4 % (ref 39.0–52.0)
Hemoglobin: 13.7 g/dL (ref 13.0–17.0)
Lymphocytes Relative: 52.7 % — ABNORMAL HIGH (ref 12.0–46.0)
Lymphs Abs: 2.1 K/uL (ref 0.7–4.0)
MCHC: 34 g/dL (ref 30.0–36.0)
MCV: 96.3 fl (ref 78.0–100.0)
Monocytes Absolute: 0.4 K/uL (ref 0.1–1.0)
Monocytes Relative: 10.7 % (ref 3.0–12.0)
Neutro Abs: 1.4 K/uL (ref 1.4–7.7)
Neutrophils Relative %: 33.3 % — ABNORMAL LOW (ref 43.0–77.0)
Platelets: 273 K/uL (ref 150.0–400.0)
RBC: 4.19 Mil/uL — ABNORMAL LOW (ref 4.22–5.81)
RDW: 13.4 % (ref 11.5–15.5)
WBC: 4.1 K/uL (ref 4.0–10.5)

## 2024-02-28 LAB — COMPREHENSIVE METABOLIC PANEL WITH GFR
ALT: 9 U/L (ref 0–53)
AST: 13 U/L (ref 0–37)
Albumin: 4.2 g/dL (ref 3.5–5.2)
Alkaline Phosphatase: 68 U/L (ref 39–117)
BUN: 14 mg/dL (ref 6–23)
CO2: 27 meq/L (ref 19–32)
Calcium: 9.4 mg/dL (ref 8.4–10.5)
Chloride: 109 meq/L (ref 96–112)
Creatinine, Ser: 1.18 mg/dL (ref 0.40–1.50)
GFR: 66.68 mL/min (ref >60.00)
Glucose, Bld: 124 mg/dL — ABNORMAL HIGH (ref 70–99)
Potassium: 4.8 meq/L (ref 3.5–5.1)
Sodium: 141 meq/L (ref 135–145)
Total Bilirubin: 1.2 mg/dL (ref 0.2–1.2)
Total Protein: 6.6 g/dL (ref 6.0–8.3)

## 2024-02-28 LAB — PSA: PSA: 7.05 ng/mL — ABNORMAL HIGH (ref 0.10–4.00)

## 2024-02-28 NOTE — Progress Notes (Signed)
 RN attempted to reach out to patient to follow up after MRI and consult with Dr. Selma.  No answer, no option to leave message.   Progress notes requested from recent visit with Dr. Selma.   Will continue to follow.

## 2024-03-02 NOTE — Progress Notes (Signed)
 Patient remains undecided regarding treatment for his stage T1c adenocarcinoma of the prostate with Gleason score of 4+4, and PSA of 5.46.  Had recent urology follow up with Dr. Selma on 11/19 and is leaning towards surgery.  He will follow up with Dr. Selma in 6 weeks and plans to have treatment decision at that time.

## 2024-03-03 ENCOUNTER — Ambulatory Visit: Payer: Self-pay | Admitting: Family Medicine

## 2024-03-05 LAB — AFP TUMOR MARKER: AFP-Tumor Marker: 4.2 ng/mL (ref <6.1)

## 2024-03-05 NOTE — Telephone Encounter (Signed)
 Patient has been notified directly; all questions, if any, were answered. Patient voiced understanding.  Of all lab results

## 2024-03-09 NOTE — Progress Notes (Signed)
 RN spoke with patient to review any possible treatment decisions or questions related to recommendations.  Patient does not have any questions at this time.  Patient confirmed he would like to see Dr. Selma as scheduled on 1/5 before finalizing treatment decision.

## 2024-03-22 ENCOUNTER — Other Ambulatory Visit: Payer: Self-pay | Admitting: Family Medicine

## 2024-03-22 DIAGNOSIS — I739 Peripheral vascular disease, unspecified: Secondary | ICD-10-CM

## 2024-04-10 NOTE — Progress Notes (Signed)
 Patient met with Dr. Selma on 04/09/24.  Patient remains adamant about not wanting to proceed with treatment as this time despite being recommendations from Dr. Selma.  Patient agreeable to repeat PSMA PET and 1 month follow up with urology.    Patient will remain under urology care. Nurse Navigator services not currently indicated at this time. Will re-evaluate if needs change or if additional support is requested.

## 2024-04-11 ENCOUNTER — Encounter: Payer: Self-pay | Admitting: Internal Medicine

## 2024-04-11 ENCOUNTER — Other Ambulatory Visit (HOSPITAL_COMMUNITY): Payer: Self-pay | Admitting: Urology

## 2024-04-11 DIAGNOSIS — C61 Malignant neoplasm of prostate: Secondary | ICD-10-CM

## 2024-04-24 ENCOUNTER — Encounter (HOSPITAL_COMMUNITY)
Admission: RE | Admit: 2024-04-24 | Discharge: 2024-04-24 | Disposition: A | Discharge status: Home / Self Care | Source: Ambulatory Visit | Attending: Urology | Admitting: Urology

## 2024-04-24 DIAGNOSIS — C61 Malignant neoplasm of prostate: Secondary | ICD-10-CM | POA: Insufficient documentation

## 2024-04-24 MED ORDER — FLOTUFOLASTAT F 18 GALLIUM 296-5846 MBQ/ML IV SOLN
8.8000 | Freq: Once | INTRAVENOUS | Status: AC
  Administered 2024-04-24: 8.8 via INTRAVENOUS
